# Patient Record
Sex: Female | Born: 1940 | Race: White | Hispanic: No | Smoking: Former smoker
Health system: Southern US, Community
[De-identification: ages and names within clinical notes are randomized; demographics above are authoritative.]

## PROBLEM LIST (undated history)

## (undated) DIAGNOSIS — C50919 Malignant neoplasm of unspecified site of unspecified female breast: Secondary | ICD-10-CM

## (undated) DIAGNOSIS — F329 Major depressive disorder, single episode, unspecified: Secondary | ICD-10-CM

## (undated) DIAGNOSIS — K219 Gastro-esophageal reflux disease without esophagitis: Secondary | ICD-10-CM

## (undated) DIAGNOSIS — G629 Polyneuropathy, unspecified: Secondary | ICD-10-CM

## (undated) DIAGNOSIS — C801 Malignant (primary) neoplasm, unspecified: Secondary | ICD-10-CM

## (undated) DIAGNOSIS — F32A Depression, unspecified: Secondary | ICD-10-CM

## (undated) DIAGNOSIS — E7800 Pure hypercholesterolemia, unspecified: Secondary | ICD-10-CM

## (undated) DIAGNOSIS — Z95828 Presence of other vascular implants and grafts: Secondary | ICD-10-CM

## (undated) DIAGNOSIS — M810 Age-related osteoporosis without current pathological fracture: Secondary | ICD-10-CM

## (undated) DIAGNOSIS — M1990 Unspecified osteoarthritis, unspecified site: Secondary | ICD-10-CM

## (undated) HISTORY — PX: ABDOMINAL HYSTERECTOMY: SHX81

## (undated) HISTORY — DX: Unspecified osteoarthritis, unspecified site: M19.90

## (undated) HISTORY — PX: JOINT REPLACEMENT: SHX530

## (undated) HISTORY — DX: Malignant neoplasm of unspecified site of unspecified female breast: C50.919

## (undated) HISTORY — PX: TONSILLECTOMY: SUR1361

## (undated) HISTORY — PX: OTHER SURGICAL HISTORY: SHX169

## (undated) HISTORY — DX: Age-related osteoporosis without current pathological fracture: M81.0

## (undated) HISTORY — DX: Presence of other vascular implants and grafts: Z95.828

## (undated) HISTORY — PX: MASTECTOMY: SHX3

## (undated) HISTORY — DX: Gastro-esophageal reflux disease without esophagitis: K21.9

---

## 2010-10-08 ENCOUNTER — Ambulatory Visit (HOSPITAL_COMMUNITY): Admission: RE | Admit: 2010-10-08 | Discharge: 2010-10-08 | Payer: Self-pay | Admitting: Family Medicine

## 2010-10-28 ENCOUNTER — Ambulatory Visit (HOSPITAL_COMMUNITY)
Admission: RE | Admit: 2010-10-28 | Discharge: 2010-10-28 | Payer: Self-pay | Source: Home / Self Care | Attending: Family Medicine | Admitting: Family Medicine

## 2010-11-04 ENCOUNTER — Encounter
Admission: RE | Admit: 2010-11-04 | Discharge: 2010-11-04 | Payer: Self-pay | Source: Home / Self Care | Attending: Family Medicine | Admitting: Family Medicine

## 2010-11-06 ENCOUNTER — Encounter
Admission: RE | Admit: 2010-11-06 | Discharge: 2010-11-06 | Payer: Self-pay | Source: Home / Self Care | Attending: Family Medicine | Admitting: Family Medicine

## 2010-11-27 ENCOUNTER — Inpatient Hospital Stay (HOSPITAL_COMMUNITY)
Admission: RE | Admit: 2010-11-27 | Discharge: 2010-11-30 | Payer: Self-pay | Source: Home / Self Care | Attending: General Surgery | Admitting: General Surgery

## 2010-11-27 ENCOUNTER — Encounter (INDEPENDENT_AMBULATORY_CARE_PROVIDER_SITE_OTHER): Payer: Self-pay | Admitting: General Surgery

## 2010-12-13 ENCOUNTER — Encounter: Payer: Self-pay | Admitting: Family Medicine

## 2010-12-21 ENCOUNTER — Other Ambulatory Visit (HOSPITAL_COMMUNITY): Payer: Self-pay | Admitting: Oncology

## 2010-12-21 ENCOUNTER — Encounter (HOSPITAL_COMMUNITY)
Admission: RE | Admit: 2010-12-21 | Discharge: 2010-12-22 | Payer: Self-pay | Source: Home / Self Care | Attending: Oncology | Admitting: Oncology

## 2010-12-21 ENCOUNTER — Ambulatory Visit (HOSPITAL_COMMUNITY)
Admission: RE | Admit: 2010-12-21 | Discharge: 2010-12-22 | Payer: Self-pay | Source: Home / Self Care | Attending: Oncology | Admitting: Oncology

## 2010-12-21 DIAGNOSIS — C50919 Malignant neoplasm of unspecified site of unspecified female breast: Secondary | ICD-10-CM

## 2010-12-21 LAB — DIFFERENTIAL
Basophils Absolute: 0.1 10*3/uL (ref 0.0–0.1)
Basophils Relative: 1 % (ref 0–1)
Eosinophils Absolute: 0.3 10*3/uL (ref 0.0–0.7)
Eosinophils Relative: 3 % (ref 0–5)
Lymphocytes Relative: 31 % (ref 12–46)
Lymphs Abs: 2.2 10*3/uL (ref 0.7–4.0)
Monocytes Absolute: 0.8 10*3/uL (ref 0.1–1.0)
Monocytes Relative: 11 % (ref 3–12)
Neutro Abs: 4 10*3/uL (ref 1.7–7.7)
Neutrophils Relative %: 55 % (ref 43–77)

## 2010-12-21 LAB — COMPREHENSIVE METABOLIC PANEL
ALT: 12 U/L (ref 0–35)
AST: 18 U/L (ref 0–37)
Albumin: 4.2 g/dL (ref 3.5–5.2)
Alkaline Phosphatase: 40 U/L (ref 39–117)
BUN: 16 mg/dL (ref 6–23)
CO2: 28 mEq/L (ref 19–32)
Calcium: 10 mg/dL (ref 8.4–10.5)
Chloride: 106 mEq/L (ref 96–112)
Creatinine, Ser: 0.56 mg/dL (ref 0.4–1.2)
GFR calc Af Amer: >60 mL/min (ref >60)
GFR calc non Af Amer: >60 mL/min (ref >60)
Glucose, Bld: 91 mg/dL (ref 70–99)
Potassium: 3.7 mEq/L (ref 3.5–5.1)
Sodium: 143 mEq/L (ref 135–145)
Total Bilirubin: 0.7 mg/dL (ref 0.3–1.2)
Total Protein: 7.1 g/dL (ref 6.0–8.3)

## 2010-12-21 LAB — CBC
HCT: 36.1 % (ref 36.0–46.0)
Hemoglobin: 12.1 g/dL (ref 12.0–15.0)
MCH: 32.3 pg (ref 26.0–34.0)
MCHC: 33.5 g/dL (ref 30.0–36.0)
MCV: 96.3 fL (ref 78.0–100.0)
Platelets: 386 10*3/uL (ref 150–400)
RBC: 3.75 MIL/uL — ABNORMAL LOW (ref 3.87–5.11)
RDW: 12.5 % (ref 11.5–15.5)
WBC: 7.3 10*3/uL (ref 4.0–10.5)

## 2010-12-22 LAB — CANCER ANTIGEN 27.29: CA 27.29: 8 U/mL (ref 0–39)

## 2010-12-23 ENCOUNTER — Other Ambulatory Visit (HOSPITAL_COMMUNITY): Payer: Self-pay | Admitting: Oncology

## 2010-12-23 ENCOUNTER — Ambulatory Visit (HOSPITAL_COMMUNITY)
Admission: RE | Admit: 2010-12-23 | Discharge: 2010-12-23 | Disposition: A | Discharge status: Home / Self Care | Payer: Medicare Other | Source: Ambulatory Visit | Attending: Oncology | Admitting: Oncology

## 2010-12-23 DIAGNOSIS — K429 Umbilical hernia without obstruction or gangrene: Secondary | ICD-10-CM | POA: Insufficient documentation

## 2010-12-23 DIAGNOSIS — M899 Disorder of bone, unspecified: Secondary | ICD-10-CM | POA: Insufficient documentation

## 2010-12-23 DIAGNOSIS — J984 Other disorders of lung: Secondary | ICD-10-CM | POA: Insufficient documentation

## 2010-12-23 DIAGNOSIS — M949 Disorder of cartilage, unspecified: Secondary | ICD-10-CM | POA: Insufficient documentation

## 2010-12-23 DIAGNOSIS — C50919 Malignant neoplasm of unspecified site of unspecified female breast: Secondary | ICD-10-CM | POA: Insufficient documentation

## 2010-12-23 MED ORDER — IOHEXOL 300 MG/ML  SOLN
80.0000 mL | Freq: Once | INTRAMUSCULAR | Status: AC | PRN
  Administered 2010-12-23: 100 mL via INTRAVENOUS

## 2010-12-24 ENCOUNTER — Encounter (HOSPITAL_COMMUNITY): Payer: Medicare Other

## 2010-12-24 LAB — SURGICAL PCR SCREEN
MRSA, PCR: NEGATIVE
Staphylococcus aureus: NEGATIVE

## 2010-12-25 ENCOUNTER — Ambulatory Visit (HOSPITAL_COMMUNITY): Payer: Self-pay

## 2010-12-25 ENCOUNTER — Ambulatory Visit (HOSPITAL_COMMUNITY)
Admission: RE | Admit: 2010-12-25 | Discharge: 2010-12-25 | Disposition: A | Discharge status: Home / Self Care | Payer: Medicare Other | Source: Ambulatory Visit | Attending: Oncology | Admitting: Oncology

## 2010-12-25 ENCOUNTER — Encounter (HOSPITAL_COMMUNITY): Payer: Self-pay

## 2010-12-25 DIAGNOSIS — C50919 Malignant neoplasm of unspecified site of unspecified female breast: Secondary | ICD-10-CM | POA: Insufficient documentation

## 2010-12-25 DIAGNOSIS — Z96649 Presence of unspecified artificial hip joint: Secondary | ICD-10-CM | POA: Insufficient documentation

## 2010-12-25 HISTORY — DX: Malignant (primary) neoplasm, unspecified: C80.1

## 2010-12-25 MED ORDER — TECHNETIUM TC 99M MEDRONATE IV KIT
25.0000 | PACK | Freq: Once | INTRAVENOUS | Status: AC | PRN
  Administered 2010-12-25: 24 via INTRAVENOUS

## 2010-12-26 NOTE — H&P (Addendum)
  Roberta Gonzalez, Roberta Gonzalez               ACCOUNT NO.:  [id number]  MEDICAL RECORD NO.:  [id number]           PATIENT TYPE:  O  LOCATION:  DOIB                          FACILITY:  [facility]  PHYSICIAN:  Dalia Heading, M.D.  DATE OF BIRTH:  1941-02-10  DATE OF ADMISSION:  12/24/2010 DATE OF DISCHARGE:  LH                             HISTORY & PHYSICAL   CHIEF COMPLAINT:  Left breast carcinoma, need for central venous access.  HISTORY OF PRESENT ILLNESS:  The patient is a 70 year old white female status post a left modified radical mastectomy, who now presents for a Port-A-Cath due to the need for central venous access for chemotherapy.  Past medical history is as noted above, high cholesterol levels, acid reflux disease, osteoporosis, right breast cancer in the remote past  PAST SURGICAL HISTORY:  Left modified radical mastectomy, hip replacements bilaterally, right modified radical mastectomy, left modified radical mastectomy as previously noted.  CURRENT MEDICATIONS:  Fosamax, an acid reflux pill, cholesterol pill.  ALLERGIES:  No known drug allergies.  REVIEW OF SYSTEMS:  The patient does drink an ounce of alcohol daily. She denies any tobacco use.  She denies any other cardiopulmonary difficulties or bleeding disorders.  FAMILY MEDICAL HISTORY:  The patient denies any immediate family history of breast cancer.  On physical examination, the patient is well-developed and well- nourished white female in no acute distress.  HEENT examination is unremarkable.  Neck is supple without lymphadenopathy.  Lungs clear to auscultation with equal breath sounds bilaterally.  Heart examination reveals regular rate and rhythm without S3, S4, or murmurs.  IMPRESSION:  Left breast carcinoma.  PLAN:  The patient is scheduled for a Port-A-Cath insertion on December 30, 2010.  The risks and benefits of the procedure including bleeding, infection and pneumothorax were fully explained to the patient,  gave informed consent.     Dalia Heading, M.D.     MAJ/MEDQ  D:  12/24/2010  T:  12/25/2010  Job:  [id number]  cc:   [facility]  [name], MD Fax: [phone]  Ladona Horns. Mariel Sleet, MD Fax: [phone]  Electronically Signed by Franky Macho M.D. on 12/26/2010 01:10:07 PM

## 2010-12-30 ENCOUNTER — Ambulatory Visit (HOSPITAL_COMMUNITY): Payer: Medicare Other

## 2010-12-30 ENCOUNTER — Ambulatory Visit (HOSPITAL_COMMUNITY)
Admission: RE | Admit: 2010-12-30 | Discharge: 2010-12-30 | Disposition: A | Discharge status: Home / Self Care | Payer: Medicare Other | Source: Ambulatory Visit | Attending: General Surgery | Admitting: General Surgery

## 2010-12-30 DIAGNOSIS — C50919 Malignant neoplasm of unspecified site of unspecified female breast: Secondary | ICD-10-CM

## 2010-12-31 ENCOUNTER — Inpatient Hospital Stay (HOSPITAL_COMMUNITY): Payer: Medicare Other

## 2010-12-31 DIAGNOSIS — C50919 Malignant neoplasm of unspecified site of unspecified female breast: Secondary | ICD-10-CM

## 2011-01-01 ENCOUNTER — Other Ambulatory Visit (HOSPITAL_COMMUNITY): Payer: Self-pay | Admitting: Oncology

## 2011-01-01 ENCOUNTER — Encounter (HOSPITAL_COMMUNITY): Payer: Medicare Other | Attending: Hematology and Oncology

## 2011-01-01 ENCOUNTER — Inpatient Hospital Stay (HOSPITAL_COMMUNITY): Payer: Medicare Other

## 2011-01-01 DIAGNOSIS — C50919 Malignant neoplasm of unspecified site of unspecified female breast: Secondary | ICD-10-CM

## 2011-01-01 DIAGNOSIS — Z5112 Encounter for antineoplastic immunotherapy: Secondary | ICD-10-CM

## 2011-01-01 DIAGNOSIS — G609 Hereditary and idiopathic neuropathy, unspecified: Secondary | ICD-10-CM | POA: Insufficient documentation

## 2011-01-01 DIAGNOSIS — R5381 Other malaise: Secondary | ICD-10-CM | POA: Insufficient documentation

## 2011-01-01 DIAGNOSIS — Z5111 Encounter for antineoplastic chemotherapy: Secondary | ICD-10-CM

## 2011-01-01 DIAGNOSIS — Z79899 Other long term (current) drug therapy: Secondary | ICD-10-CM | POA: Insufficient documentation

## 2011-01-01 LAB — DIFFERENTIAL
Basophils Relative: 0 % (ref 0–1)
Eosinophils Absolute: 0 10*3/uL (ref 0.0–0.7)
Eosinophils Relative: 0 % (ref 0–5)
Lymphs Abs: 1 10*3/uL (ref 0.7–4.0)
Monocytes Absolute: 0.5 10*3/uL (ref 0.1–1.0)
Monocytes Relative: 7 % (ref 3–12)
Neutrophils Relative %: 79 % — ABNORMAL HIGH (ref 43–77)

## 2011-01-01 LAB — CBC
HCT: 33 % — ABNORMAL LOW (ref 36.0–46.0)
Hemoglobin: 11.2 g/dL — ABNORMAL LOW (ref 12.0–15.0)
MCH: 32 pg (ref 26.0–34.0)
MCHC: 33.9 g/dL (ref 30.0–36.0)
MCV: 94.3 fL (ref 78.0–100.0)

## 2011-01-01 LAB — COMPREHENSIVE METABOLIC PANEL
BUN: 25 mg/dL — ABNORMAL HIGH (ref 6–23)
CO2: 22 mEq/L (ref 19–32)
Calcium: 9.9 mg/dL (ref 8.4–10.5)
Creatinine, Ser: 0.57 mg/dL (ref 0.4–1.2)
GFR calc non Af Amer: >60 mL/min (ref >60)
Glucose, Bld: 194 mg/dL — ABNORMAL HIGH (ref 70–99)
Total Protein: 7 g/dL (ref 6.0–8.3)

## 2011-01-03 NOTE — Op Note (Signed)
  NAMEFAYRENE, Roberta Gonzalez               ACCOUNT NO.:  [id number]  MEDICAL RECORD NO.:  [id number]           PATIENT TYPE:  O  LOCATION:  DAYP                          FACILITY:  [facility]  PHYSICIAN:  Dalia Heading, M.D.  DATE OF BIRTH:  Sep 29, 1941  DATE OF PROCEDURE: DATE OF DISCHARGE:  12/30/2010                              OPERATIVE REPORT   PREOPERATIVE DIAGNOSIS:  Left breast carcinoma, need for central venous access for chemotherapy.  POSTOPERATIVE DIAGNOSIS:  Left breast carcinoma, need for central venous access for chemotherapy.  PROCEDURE:  Port-A-Cath insertion.  SURGEON:  Dalia Heading, MD  ANESTHESIA:  Attended or monitored anesthesia care.  INDICATIONS:  The patient is a 70 year old white female with status post left modified radical mastectomy, who now presents for Port-A-Cath insertion due to the need for central venous access for chemotherapy. The risks and benefits of procedure including bleeding and pneumothorax were fully explained to the patient, gave informed consent.  PROCEDURE NOTE:  The patient was placed in the Trendelenburg position after the right upper chest was prepped and draped using the usual sterile technique with DuraPrep.  Surgical site confirmation was performed.  1% Xylocaine was used for local anesthesia.  An incision was made below the right clavicle.  Subcutaneous pocket was then formed.  A needle was advanced into the right subclavian vein using the Seldinger technique without difficulty.  The guidewire was then advanced into the right atrium under digital fluoroscopy.  An introducer and peel-away sheath were placed over the guidewire.  The catheter was then inserted through the peel-away sheath and peel-away sheath was removed.  The catheter was then attached to a power port and the power port was placed in subcutaneous pocket.  Adequate positioning was confirmed by fluoroscopy.  The port was flushed with 3000 units of heparin.  A  subcutaneous layer was reapproximated using 3-0 Vicryl interrupted suture.  The skin was closed using 4-0 Vicryl subcuticular suture. Dermabond was then applied.  All tape and needle counts correct at the end of procedure.  The patient was transferred to PACU in stable condition.  Chest x-ray will be performed at that time.  COMPLICATIONS:  None.  SPECIMEN:  None.  BLOOD LOSS:  Minimal.     Dalia Heading, M.D.     MAJ/MEDQ  D:  12/30/2010  T:  12/31/2010  Job:  [id number]  cc:   [name], MD Fax: [phone]  Ladona Horns. Mariel Sleet, MD Fax: [phone]  Electronically Signed by Franky Macho M.D. on 01/01/2011 01:25:00 PM

## 2011-01-08 ENCOUNTER — Encounter (HOSPITAL_COMMUNITY): Payer: Medicare Other | Attending: Hematology and Oncology

## 2011-01-08 ENCOUNTER — Other Ambulatory Visit (HOSPITAL_COMMUNITY): Payer: Medicare Other

## 2011-01-08 DIAGNOSIS — R5381 Other malaise: Secondary | ICD-10-CM | POA: Insufficient documentation

## 2011-01-08 DIAGNOSIS — Z79899 Other long term (current) drug therapy: Secondary | ICD-10-CM | POA: Insufficient documentation

## 2011-01-08 DIAGNOSIS — C50919 Malignant neoplasm of unspecified site of unspecified female breast: Secondary | ICD-10-CM

## 2011-01-08 DIAGNOSIS — G609 Hereditary and idiopathic neuropathy, unspecified: Secondary | ICD-10-CM | POA: Insufficient documentation

## 2011-01-15 ENCOUNTER — Other Ambulatory Visit (HOSPITAL_COMMUNITY): Payer: Medicare Other

## 2011-01-15 ENCOUNTER — Encounter (HOSPITAL_COMMUNITY): Payer: Medicare Other

## 2011-01-15 DIAGNOSIS — C50919 Malignant neoplasm of unspecified site of unspecified female breast: Secondary | ICD-10-CM

## 2011-01-20 ENCOUNTER — Ambulatory Visit (HOSPITAL_COMMUNITY): Payer: Medicare Other | Admitting: Oncology

## 2011-01-20 DIAGNOSIS — C50919 Malignant neoplasm of unspecified site of unspecified female breast: Secondary | ICD-10-CM

## 2011-01-22 ENCOUNTER — Inpatient Hospital Stay (HOSPITAL_COMMUNITY): Payer: Medicare Other

## 2011-01-22 ENCOUNTER — Ambulatory Visit (HOSPITAL_COMMUNITY): Payer: Medicare Other

## 2011-01-22 DIAGNOSIS — Z5112 Encounter for antineoplastic immunotherapy: Secondary | ICD-10-CM

## 2011-01-22 DIAGNOSIS — C50919 Malignant neoplasm of unspecified site of unspecified female breast: Secondary | ICD-10-CM

## 2011-01-22 DIAGNOSIS — Z5111 Encounter for antineoplastic chemotherapy: Secondary | ICD-10-CM

## 2011-02-01 ENCOUNTER — Inpatient Hospital Stay (HOSPITAL_COMMUNITY): Payer: Medicare Other

## 2011-02-01 LAB — COMPREHENSIVE METABOLIC PANEL
ALT: 22 U/L (ref 0–35)
AST: 23 U/L (ref 0–37)
Albumin: 4.2 g/dL (ref 3.5–5.2)
Alkaline Phosphatase: 51 U/L (ref 39–117)
BUN: 18 mg/dL (ref 6–23)
CO2: 28 mEq/L (ref 19–32)
Calcium: 10 mg/dL (ref 8.4–10.5)
Chloride: 103 mEq/L (ref 96–112)
Creatinine, Ser: 0.66 mg/dL (ref 0.4–1.2)
GFR calc Af Amer: >60 mL/min (ref >60)
GFR calc non Af Amer: >60 mL/min (ref >60)
Glucose, Bld: 95 mg/dL (ref 70–99)
Potassium: 4.3 mEq/L (ref 3.5–5.1)
Sodium: 144 mEq/L (ref 135–145)
Total Bilirubin: 0.3 mg/dL (ref 0.3–1.2)
Total Protein: 7 g/dL (ref 6.0–8.3)

## 2011-02-01 LAB — SURGICAL PCR SCREEN
MRSA, PCR: NEGATIVE
Staphylococcus aureus: NEGATIVE

## 2011-02-01 LAB — CBC
HCT: 36.1 % (ref 36.0–46.0)
Hemoglobin: 12.5 g/dL (ref 12.0–15.0)
MCH: 32.9 pg (ref 26.0–34.0)
MCHC: 34.6 g/dL (ref 30.0–36.0)
MCV: 95 fL (ref 78.0–100.0)
Platelets: 373 10*3/uL (ref 150–400)
RBC: 3.8 MIL/uL — ABNORMAL LOW (ref 3.87–5.11)
RDW: 11.9 % (ref 11.5–15.5)
WBC: 8.1 10*3/uL (ref 4.0–10.5)

## 2011-02-01 LAB — CROSSMATCH
ABO/RH(D): A NEG
Antibody Screen: NEGATIVE
Unit division: 0
Unit division: 0
Unit division: 0

## 2011-02-01 LAB — ABO/RH: ABO/RH(D): A NEG

## 2011-02-11 ENCOUNTER — Ambulatory Visit (HOSPITAL_COMMUNITY): Payer: Medicare Other | Admitting: Oncology

## 2011-02-11 ENCOUNTER — Encounter (HOSPITAL_COMMUNITY): Payer: Medicare Other | Attending: Oncology

## 2011-02-11 ENCOUNTER — Other Ambulatory Visit (HOSPITAL_COMMUNITY): Payer: Medicare Other

## 2011-02-11 ENCOUNTER — Other Ambulatory Visit (HOSPITAL_COMMUNITY): Payer: Self-pay | Admitting: Oncology

## 2011-02-11 DIAGNOSIS — C773 Secondary and unspecified malignant neoplasm of axilla and upper limb lymph nodes: Secondary | ICD-10-CM | POA: Insufficient documentation

## 2011-02-11 DIAGNOSIS — C50919 Malignant neoplasm of unspecified site of unspecified female breast: Secondary | ICD-10-CM

## 2011-02-11 DIAGNOSIS — R911 Solitary pulmonary nodule: Secondary | ICD-10-CM

## 2011-02-11 DIAGNOSIS — Z79899 Other long term (current) drug therapy: Secondary | ICD-10-CM | POA: Insufficient documentation

## 2011-02-12 ENCOUNTER — Ambulatory Visit (HOSPITAL_COMMUNITY): Payer: Medicare Other

## 2011-02-12 DIAGNOSIS — C50919 Malignant neoplasm of unspecified site of unspecified female breast: Secondary | ICD-10-CM

## 2011-02-12 DIAGNOSIS — Z5111 Encounter for antineoplastic chemotherapy: Secondary | ICD-10-CM

## 2011-02-12 DIAGNOSIS — Z5112 Encounter for antineoplastic immunotherapy: Secondary | ICD-10-CM

## 2011-02-16 ENCOUNTER — Ambulatory Visit (HOSPITAL_COMMUNITY)
Admission: RE | Admit: 2011-02-16 | Discharge: 2011-02-16 | Disposition: A | Discharge status: Home / Self Care | Payer: Medicare Other | Source: Ambulatory Visit | Attending: Oncology | Admitting: Oncology

## 2011-02-16 ENCOUNTER — Other Ambulatory Visit (HOSPITAL_COMMUNITY): Payer: Self-pay | Admitting: Oncology

## 2011-02-16 DIAGNOSIS — Z9221 Personal history of antineoplastic chemotherapy: Secondary | ICD-10-CM | POA: Insufficient documentation

## 2011-02-16 DIAGNOSIS — C50919 Malignant neoplasm of unspecified site of unspecified female breast: Secondary | ICD-10-CM | POA: Insufficient documentation

## 2011-02-16 DIAGNOSIS — Z09 Encounter for follow-up examination after completed treatment for conditions other than malignant neoplasm: Secondary | ICD-10-CM

## 2011-03-04 ENCOUNTER — Other Ambulatory Visit (HOSPITAL_COMMUNITY): Payer: Self-pay | Admitting: Oncology

## 2011-03-04 ENCOUNTER — Encounter (HOSPITAL_COMMUNITY): Payer: Medicare Other | Attending: Hematology and Oncology | Admitting: Oncology

## 2011-03-04 ENCOUNTER — Encounter (HOSPITAL_COMMUNITY): Payer: Medicare Other

## 2011-03-04 DIAGNOSIS — C50919 Malignant neoplasm of unspecified site of unspecified female breast: Secondary | ICD-10-CM | POA: Insufficient documentation

## 2011-03-04 DIAGNOSIS — R5381 Other malaise: Secondary | ICD-10-CM | POA: Insufficient documentation

## 2011-03-04 DIAGNOSIS — G609 Hereditary and idiopathic neuropathy, unspecified: Secondary | ICD-10-CM | POA: Insufficient documentation

## 2011-03-04 DIAGNOSIS — Z79899 Other long term (current) drug therapy: Secondary | ICD-10-CM | POA: Insufficient documentation

## 2011-03-05 ENCOUNTER — Emergency Department (HOSPITAL_COMMUNITY)
Admission: EM | Admit: 2011-03-05 | Discharge: 2011-03-06 | Disposition: A | Discharge status: Home / Self Care | Payer: Medicare Other | Attending: Emergency Medicine | Admitting: Emergency Medicine

## 2011-03-05 ENCOUNTER — Encounter (HOSPITAL_COMMUNITY): Payer: Medicare Other

## 2011-03-05 DIAGNOSIS — Z9889 Other specified postprocedural states: Secondary | ICD-10-CM | POA: Insufficient documentation

## 2011-03-05 DIAGNOSIS — Z79899 Other long term (current) drug therapy: Secondary | ICD-10-CM | POA: Insufficient documentation

## 2011-03-05 DIAGNOSIS — C50919 Malignant neoplasm of unspecified site of unspecified female breast: Secondary | ICD-10-CM

## 2011-03-05 DIAGNOSIS — Z5112 Encounter for antineoplastic immunotherapy: Secondary | ICD-10-CM

## 2011-03-05 DIAGNOSIS — R1013 Epigastric pain: Secondary | ICD-10-CM | POA: Insufficient documentation

## 2011-03-05 DIAGNOSIS — L658 Other specified nonscarring hair loss: Secondary | ICD-10-CM | POA: Insufficient documentation

## 2011-03-05 DIAGNOSIS — Z5111 Encounter for antineoplastic chemotherapy: Secondary | ICD-10-CM

## 2011-03-05 DIAGNOSIS — K121 Other forms of stomatitis: Secondary | ICD-10-CM | POA: Insufficient documentation

## 2011-03-05 DIAGNOSIS — K219 Gastro-esophageal reflux disease without esophagitis: Secondary | ICD-10-CM | POA: Insufficient documentation

## 2011-03-05 LAB — ANA: Anti Nuclear Antibody(ANA): NEGATIVE

## 2011-03-06 ENCOUNTER — Emergency Department (HOSPITAL_COMMUNITY): Payer: Medicare Other

## 2011-03-06 LAB — CBC
HCT: 32 % — ABNORMAL LOW (ref 36.0–46.0)
MCV: 94.7 fL (ref 78.0–100.0)
Platelets: 211 10*3/uL (ref 150–400)
RBC: 3.38 MIL/uL — ABNORMAL LOW (ref 3.87–5.11)
RDW: 13.7 % (ref 11.5–15.5)
WBC: 7.5 10*3/uL (ref 4.0–10.5)

## 2011-03-06 LAB — BASIC METABOLIC PANEL
BUN: 14 mg/dL (ref 6–23)
Calcium: 8.3 mg/dL — ABNORMAL LOW (ref 8.4–10.5)
GFR calc non Af Amer: >60 mL/min (ref >60)
Glucose, Bld: 133 mg/dL — ABNORMAL HIGH (ref 70–99)
Potassium: 3.2 mEq/L — ABNORMAL LOW (ref 3.5–5.1)

## 2011-03-06 LAB — POCT CARDIAC MARKERS
CKMB, poc: 2.5 ng/mL (ref 1.0–8.0)
Myoglobin, poc: 481 ng/mL (ref 12–200)
Troponin i, poc: <0.05 ng/mL (ref 0.00–0.09)

## 2011-03-06 LAB — DIFFERENTIAL
Basophils Absolute: 0 10*3/uL (ref 0.0–0.1)
Eosinophils Absolute: 0 10*3/uL (ref 0.0–0.7)
Eosinophils Relative: 0 % (ref 0–5)
Lymphocytes Relative: 10 % — ABNORMAL LOW (ref 12–46)
Lymphs Abs: 0.8 10*3/uL (ref 0.7–4.0)
Neutrophils Relative %: 79 % — ABNORMAL HIGH (ref 43–77)

## 2011-03-15 ENCOUNTER — Other Ambulatory Visit (HOSPITAL_COMMUNITY): Payer: Medicare Other

## 2011-03-22 ENCOUNTER — Ambulatory Visit: Payer: Medicare Other | Admitting: Cardiology

## 2011-03-25 ENCOUNTER — Other Ambulatory Visit (HOSPITAL_COMMUNITY): Payer: Self-pay | Admitting: Oncology

## 2011-03-25 ENCOUNTER — Encounter (HOSPITAL_COMMUNITY): Payer: Medicare Other | Attending: Hematology and Oncology | Admitting: Oncology

## 2011-03-25 DIAGNOSIS — C50919 Malignant neoplasm of unspecified site of unspecified female breast: Secondary | ICD-10-CM | POA: Insufficient documentation

## 2011-03-25 DIAGNOSIS — G609 Hereditary and idiopathic neuropathy, unspecified: Secondary | ICD-10-CM | POA: Insufficient documentation

## 2011-03-25 DIAGNOSIS — Z79899 Other long term (current) drug therapy: Secondary | ICD-10-CM | POA: Insufficient documentation

## 2011-03-25 DIAGNOSIS — R5383 Other fatigue: Secondary | ICD-10-CM | POA: Insufficient documentation

## 2011-03-25 DIAGNOSIS — R5381 Other malaise: Secondary | ICD-10-CM | POA: Insufficient documentation

## 2011-03-25 LAB — DIFFERENTIAL
Basophils Absolute: 0 10*3/uL (ref 0.0–0.1)
Basophils Relative: 0 % (ref 0–1)
Eosinophils Absolute: 0 10*3/uL (ref 0.0–0.7)
Eosinophils Relative: 0 % (ref 0–5)
Neutrophils Relative %: 85 % — ABNORMAL HIGH (ref 43–77)

## 2011-03-25 LAB — COMPREHENSIVE METABOLIC PANEL
ALT: 12 U/L (ref 0–35)
AST: 14 U/L (ref 0–37)
Albumin: 3.7 g/dL (ref 3.5–5.2)
Alkaline Phosphatase: 51 U/L (ref 39–117)
BUN: 11 mg/dL (ref 6–23)
Chloride: 104 mEq/L (ref 96–112)
Potassium: 3.6 mEq/L (ref 3.5–5.1)
Sodium: 143 mEq/L (ref 135–145)
Total Protein: 6.3 g/dL (ref 6.0–8.3)

## 2011-03-25 LAB — CBC
Platelets: 227 10*3/uL (ref 150–400)
RBC: 3.34 MIL/uL — ABNORMAL LOW (ref 3.87–5.11)
RDW: 14.5 % (ref 11.5–15.5)
WBC: 7.1 10*3/uL (ref 4.0–10.5)

## 2011-03-26 ENCOUNTER — Encounter (HOSPITAL_COMMUNITY): Payer: Medicare Other | Attending: Oncology

## 2011-03-26 DIAGNOSIS — Z5111 Encounter for antineoplastic chemotherapy: Secondary | ICD-10-CM

## 2011-03-26 DIAGNOSIS — C50919 Malignant neoplasm of unspecified site of unspecified female breast: Secondary | ICD-10-CM

## 2011-03-29 ENCOUNTER — Other Ambulatory Visit (HOSPITAL_COMMUNITY): Payer: Medicare Other

## 2011-04-13 ENCOUNTER — Other Ambulatory Visit (HOSPITAL_COMMUNITY): Payer: Self-pay | Admitting: Oncology

## 2011-04-13 ENCOUNTER — Encounter (HOSPITAL_COMMUNITY): Payer: Medicare Other | Admitting: Oncology

## 2011-04-13 DIAGNOSIS — C50919 Malignant neoplasm of unspecified site of unspecified female breast: Secondary | ICD-10-CM

## 2011-04-13 DIAGNOSIS — M549 Dorsalgia, unspecified: Secondary | ICD-10-CM

## 2011-04-13 LAB — CBC
HCT: 30.3 % — ABNORMAL LOW (ref 36.0–46.0)
Hemoglobin: 9.8 g/dL — ABNORMAL LOW (ref 12.0–15.0)
MCH: 31.3 pg (ref 26.0–34.0)
MCHC: 32.3 g/dL (ref 30.0–36.0)
MCV: 96.8 fL (ref 78.0–100.0)
RDW: 15.3 % (ref 11.5–15.5)

## 2011-04-13 LAB — COMPREHENSIVE METABOLIC PANEL
AST: 15 U/L (ref 0–37)
Albumin: 3.5 g/dL (ref 3.5–5.2)
Alkaline Phosphatase: 56 U/L (ref 39–117)
Chloride: 106 mEq/L (ref 96–112)
Creatinine, Ser: <0.47 mg/dL (ref 0.4–1.2)
Potassium: 3.9 mEq/L (ref 3.5–5.1)
Total Bilirubin: 0.2 mg/dL — ABNORMAL LOW (ref 0.3–1.2)
Total Protein: 6.3 g/dL (ref 6.0–8.3)

## 2011-04-13 LAB — DIFFERENTIAL
Basophils Absolute: 0 10*3/uL (ref 0.0–0.1)
Eosinophils Relative: 0 % (ref 0–5)
Lymphocytes Relative: 34 % (ref 12–46)
Monocytes Absolute: 0.7 10*3/uL (ref 0.1–1.0)
Monocytes Relative: 17 % — ABNORMAL HIGH (ref 3–12)

## 2011-04-16 ENCOUNTER — Encounter (HOSPITAL_COMMUNITY): Payer: Medicare Other

## 2011-04-16 DIAGNOSIS — Z5112 Encounter for antineoplastic immunotherapy: Secondary | ICD-10-CM

## 2011-04-16 DIAGNOSIS — Z5111 Encounter for antineoplastic chemotherapy: Secondary | ICD-10-CM

## 2011-04-16 DIAGNOSIS — C50919 Malignant neoplasm of unspecified site of unspecified female breast: Secondary | ICD-10-CM

## 2011-04-20 ENCOUNTER — Encounter (HOSPITAL_COMMUNITY): Payer: Medicare Other

## 2011-04-20 ENCOUNTER — Ambulatory Visit (HOSPITAL_COMMUNITY)
Admission: RE | Admit: 2011-04-20 | Discharge: 2011-04-20 | Disposition: A | Discharge status: Home / Self Care | Payer: Medicare Other | Source: Ambulatory Visit | Attending: Oncology | Admitting: Oncology

## 2011-04-20 ENCOUNTER — Encounter (HOSPITAL_COMMUNITY)
Admission: RE | Admit: 2011-04-20 | Discharge: 2011-04-20 | Disposition: A | Discharge status: Home / Self Care | Payer: Medicare Other | Source: Ambulatory Visit | Attending: Oncology | Admitting: Oncology

## 2011-04-20 ENCOUNTER — Other Ambulatory Visit (HOSPITAL_COMMUNITY): Payer: Self-pay | Admitting: Oncology

## 2011-04-20 DIAGNOSIS — Z452 Encounter for adjustment and management of vascular access device: Secondary | ICD-10-CM

## 2011-04-20 DIAGNOSIS — C50919 Malignant neoplasm of unspecified site of unspecified female breast: Secondary | ICD-10-CM

## 2011-04-20 DIAGNOSIS — R937 Abnormal findings on diagnostic imaging of other parts of musculoskeletal system: Secondary | ICD-10-CM | POA: Insufficient documentation

## 2011-04-20 DIAGNOSIS — M549 Dorsalgia, unspecified: Secondary | ICD-10-CM | POA: Insufficient documentation

## 2011-04-20 MED ORDER — TECHNETIUM TC 99M MEDRONATE IV KIT
25.0000 | PACK | Freq: Once | INTRAVENOUS | Status: AC | PRN
  Administered 2011-04-20: 27 via INTRAVENOUS

## 2011-04-26 ENCOUNTER — Ambulatory Visit
Admission: RE | Admit: 2011-04-26 | Discharge: 2011-04-26 | Disposition: A | Discharge status: Home / Self Care | Payer: Medicare Other | Source: Ambulatory Visit | Attending: Radiation Oncology | Admitting: Radiation Oncology

## 2011-04-26 DIAGNOSIS — Z79899 Other long term (current) drug therapy: Secondary | ICD-10-CM | POA: Insufficient documentation

## 2011-04-26 DIAGNOSIS — Z8505 Personal history of malignant neoplasm of liver: Secondary | ICD-10-CM | POA: Insufficient documentation

## 2011-04-26 DIAGNOSIS — E7800 Pure hypercholesterolemia, unspecified: Secondary | ICD-10-CM | POA: Insufficient documentation

## 2011-04-26 DIAGNOSIS — G5790 Unspecified mononeuropathy of unspecified lower limb: Secondary | ICD-10-CM | POA: Insufficient documentation

## 2011-04-26 DIAGNOSIS — Z9010 Acquired absence of unspecified breast and nipple: Secondary | ICD-10-CM | POA: Insufficient documentation

## 2011-04-26 DIAGNOSIS — Z85038 Personal history of other malignant neoplasm of large intestine: Secondary | ICD-10-CM | POA: Insufficient documentation

## 2011-04-26 DIAGNOSIS — C773 Secondary and unspecified malignant neoplasm of axilla and upper limb lymph nodes: Secondary | ICD-10-CM | POA: Insufficient documentation

## 2011-04-26 DIAGNOSIS — K219 Gastro-esophageal reflux disease without esophagitis: Secondary | ICD-10-CM | POA: Insufficient documentation

## 2011-04-26 DIAGNOSIS — Z85028 Personal history of other malignant neoplasm of stomach: Secondary | ICD-10-CM | POA: Insufficient documentation

## 2011-04-26 DIAGNOSIS — C50919 Malignant neoplasm of unspecified site of unspecified female breast: Secondary | ICD-10-CM | POA: Insufficient documentation

## 2011-05-07 ENCOUNTER — Other Ambulatory Visit (HOSPITAL_COMMUNITY): Payer: Self-pay | Admitting: Oncology

## 2011-05-07 ENCOUNTER — Encounter (HOSPITAL_COMMUNITY): Payer: Medicare Other | Attending: Hematology and Oncology

## 2011-05-07 DIAGNOSIS — C50919 Malignant neoplasm of unspecified site of unspecified female breast: Secondary | ICD-10-CM | POA: Insufficient documentation

## 2011-05-07 DIAGNOSIS — Z79899 Other long term (current) drug therapy: Secondary | ICD-10-CM | POA: Insufficient documentation

## 2011-05-07 DIAGNOSIS — Z5111 Encounter for antineoplastic chemotherapy: Secondary | ICD-10-CM

## 2011-05-07 DIAGNOSIS — R5381 Other malaise: Secondary | ICD-10-CM | POA: Insufficient documentation

## 2011-05-07 DIAGNOSIS — G609 Hereditary and idiopathic neuropathy, unspecified: Secondary | ICD-10-CM | POA: Insufficient documentation

## 2011-05-07 LAB — DIFFERENTIAL
Lymphocytes Relative: 31 % (ref 12–46)
Lymphs Abs: 1.2 10*3/uL (ref 0.7–4.0)
Monocytes Absolute: 0.6 10*3/uL (ref 0.1–1.0)
Monocytes Relative: 15 % — ABNORMAL HIGH (ref 3–12)
Neutro Abs: 2 10*3/uL (ref 1.7–7.7)
Neutrophils Relative %: 53 % (ref 43–77)

## 2011-05-07 LAB — CBC
HCT: 29.9 % — ABNORMAL LOW (ref 36.0–46.0)
Hemoglobin: 9.6 g/dL — ABNORMAL LOW (ref 12.0–15.0)
MCHC: 32.1 g/dL (ref 30.0–36.0)
RBC: 2.97 MIL/uL — ABNORMAL LOW (ref 3.87–5.11)
WBC: 3.8 10*3/uL — ABNORMAL LOW (ref 4.0–10.5)

## 2011-05-28 ENCOUNTER — Other Ambulatory Visit (HOSPITAL_COMMUNITY): Payer: Self-pay | Admitting: Oncology

## 2011-05-28 ENCOUNTER — Encounter (HOSPITAL_COMMUNITY): Payer: Medicare Other | Attending: Hematology and Oncology

## 2011-05-28 DIAGNOSIS — C50919 Malignant neoplasm of unspecified site of unspecified female breast: Secondary | ICD-10-CM

## 2011-05-28 LAB — CBC
HCT: 30.8 % — ABNORMAL LOW (ref 36.0–46.0)
Hemoglobin: 10 g/dL — ABNORMAL LOW (ref 12.0–15.0)
MCHC: 32.5 g/dL (ref 30.0–36.0)
MCV: 104.4 fL — ABNORMAL HIGH (ref 78.0–100.0)
RDW: 15.5 % (ref 11.5–15.5)

## 2011-05-28 LAB — DIFFERENTIAL
Basophils Absolute: 0 10*3/uL (ref 0.0–0.1)
Eosinophils Relative: 3 % (ref 0–5)
Lymphocytes Relative: 25 % (ref 12–46)
Lymphs Abs: 0.9 10*3/uL (ref 0.7–4.0)
Monocytes Absolute: 0.5 10*3/uL (ref 0.1–1.0)
Monocytes Relative: 12 % (ref 3–12)
Neutro Abs: 2.3 10*3/uL (ref 1.7–7.7)

## 2011-06-02 ENCOUNTER — Ambulatory Visit (HOSPITAL_COMMUNITY)
Admission: RE | Admit: 2011-06-02 | Discharge: 2011-06-02 | Disposition: A | Discharge status: Home / Self Care | Payer: Medicare Other | Source: Ambulatory Visit | Attending: Oncology | Admitting: Oncology

## 2011-06-02 DIAGNOSIS — C50919 Malignant neoplasm of unspecified site of unspecified female breast: Secondary | ICD-10-CM | POA: Insufficient documentation

## 2011-06-02 DIAGNOSIS — Z09 Encounter for follow-up examination after completed treatment for conditions other than malignant neoplasm: Secondary | ICD-10-CM | POA: Insufficient documentation

## 2011-06-02 DIAGNOSIS — Z923 Personal history of irradiation: Secondary | ICD-10-CM | POA: Insufficient documentation

## 2011-06-10 ENCOUNTER — Encounter (HOSPITAL_COMMUNITY): Payer: Self-pay | Admitting: Oncology

## 2011-06-10 ENCOUNTER — Other Ambulatory Visit (HOSPITAL_COMMUNITY): Payer: Self-pay | Admitting: Oncology

## 2011-06-10 DIAGNOSIS — K219 Gastro-esophageal reflux disease without esophagitis: Secondary | ICD-10-CM

## 2011-06-10 DIAGNOSIS — C50919 Malignant neoplasm of unspecified site of unspecified female breast: Secondary | ICD-10-CM

## 2011-06-10 DIAGNOSIS — R079 Chest pain, unspecified: Secondary | ICD-10-CM | POA: Insufficient documentation

## 2011-06-10 HISTORY — DX: Malignant neoplasm of unspecified site of unspecified female breast: C50.919

## 2011-06-10 HISTORY — DX: Gastro-esophageal reflux disease without esophagitis: K21.9

## 2011-06-11 ENCOUNTER — Other Ambulatory Visit (HOSPITAL_COMMUNITY): Payer: Self-pay | Admitting: Oncology

## 2011-06-17 ENCOUNTER — Other Ambulatory Visit (HOSPITAL_COMMUNITY): Payer: Self-pay | Admitting: Oncology

## 2011-06-17 DIAGNOSIS — C50919 Malignant neoplasm of unspecified site of unspecified female breast: Secondary | ICD-10-CM

## 2011-06-18 ENCOUNTER — Other Ambulatory Visit (HOSPITAL_COMMUNITY): Payer: Medicare Other

## 2011-06-18 ENCOUNTER — Encounter (HOSPITAL_BASED_OUTPATIENT_CLINIC_OR_DEPARTMENT_OTHER): Payer: Medicare Other | Admitting: Oncology

## 2011-06-18 ENCOUNTER — Encounter (HOSPITAL_BASED_OUTPATIENT_CLINIC_OR_DEPARTMENT_OTHER): Payer: Medicare Other

## 2011-06-18 VITALS — BP 93/49 | HR 74 | Temp 98.7°F | Wt 113.0 lb

## 2011-06-18 DIAGNOSIS — Z5111 Encounter for antineoplastic chemotherapy: Secondary | ICD-10-CM

## 2011-06-18 DIAGNOSIS — C50919 Malignant neoplasm of unspecified site of unspecified female breast: Secondary | ICD-10-CM

## 2011-06-18 DIAGNOSIS — K219 Gastro-esophageal reflux disease without esophagitis: Secondary | ICD-10-CM

## 2011-06-18 LAB — DIFFERENTIAL
Basophils Relative: 1 % (ref 0–1)
Eosinophils Absolute: 0.2 10*3/uL (ref 0.0–0.7)
Eosinophils Relative: 6 % — ABNORMAL HIGH (ref 0–5)
Lymphs Abs: 0.6 10*3/uL — ABNORMAL LOW (ref 0.7–4.0)
Monocytes Relative: 13 % — ABNORMAL HIGH (ref 3–12)
Neutrophils Relative %: 63 % (ref 43–77)

## 2011-06-18 LAB — CBC
Hemoglobin: 10.4 g/dL — ABNORMAL LOW (ref 12.0–15.0)
MCH: 34.3 pg — ABNORMAL HIGH (ref 26.0–34.0)
MCHC: 32.9 g/dL (ref 30.0–36.0)
MCV: 104.3 fL — ABNORMAL HIGH (ref 78.0–100.0)
Platelets: 218 10*3/uL (ref 150–400)
RBC: 3.03 MIL/uL — ABNORMAL LOW (ref 3.87–5.11)

## 2011-06-18 MED ORDER — SODIUM CHLORIDE 0.9 % IV SOLN
Freq: Once | INTRAVENOUS | Status: AC
  Administered 2011-06-18: 11:00:00 via INTRAVENOUS

## 2011-06-18 MED ORDER — SODIUM CHLORIDE 0.9 % IJ SOLN
10.0000 mL | INTRAMUSCULAR | Status: DC | PRN
  Administered 2011-06-18: 10 mL

## 2011-06-18 MED ORDER — HEPARIN SOD (PORK) LOCK FLUSH 100 UNIT/ML IV SOLN: 250.0000 [IU] | Freq: Once | INTRAVENOUS | Status: DC | PRN

## 2011-06-18 MED ORDER — SODIUM CHLORIDE 0.9 % IJ SOLN
INTRAMUSCULAR | Status: AC
  Filled 2011-06-18: qty 10

## 2011-06-18 MED ORDER — TRASTUZUMAB CHEMO INJECTION 440 MG
6.0000 mg/kg | Freq: Once | INTRAVENOUS | Status: AC
  Administered 2011-06-18: 315 mg via INTRAVENOUS
  Filled 2011-06-18: qty 15

## 2011-06-18 MED ORDER — HEPARIN SOD (PORK) LOCK FLUSH 100 UNIT/ML IV SOLN
INTRAVENOUS | Status: AC
  Filled 2011-06-18: qty 5

## 2011-06-18 MED ORDER — HEPARIN SOD (PORK) LOCK FLUSH 100 UNIT/ML IV SOLN
500.0000 [IU] | Freq: Once | INTRAVENOUS | Status: AC | PRN
  Administered 2011-06-18: 500 [IU]

## 2011-06-18 NOTE — Progress Notes (Signed)
Roberta Stalling, MD [address] [place] [address]  1. Invasive ductal carcinoma of breast   2. GERD (gastroesophageal reflux disease)     CURRENT THERAPY: S/P6 cycles of Taxotere and Carboplatin.  Now on Herceptin which she will be on for one year. Patient reports that she has approximately 2 more weeks worth of radiation therapy.  INTERVAL HISTORY: Roberta Gonzalez 70 y.o. female returns for followup of breast cancer. The patient reports that the chest pain she experienced in the past, has resolved. As a result I wonder if this was related to chemotherapy. The patient admits to a "rash" located in the field of radiation therapy. She does report that it appears as though she'll be losing her right foot big toenail. According to the patient her radiation oncologist gave her a Benadryl cream to put on her "rash".  Patient denies any complaints. She's happy that her chest pain she expressed the past has resolved. She continues to utilize her acid reflux medications are prescribed in the past periodically. She's happy to report that she is able to eat acidic food which she tried to avoid in the past. She is pleased that she can begin eating the fruits that she wants used to eat.  Past Medical History  Diagnosis Date  . Cancer   . 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 GERD (gastroesophageal reflux disease) 06/10/2011  . Chest pain 06/10/2011    has Invasive ductal carcinoma of breast; GERD (gastroesophageal reflux disease); and Chest pain on her problem list.      has no known allergies.  Ms. Chapa had no medications administered during this visit.  No past surgical history on file.  Denies any headaches, dizziness, double vision, fevers, chills, night sweats, nausea, vomiting, diarrhea, constipation, chest pain, heart palpitations, shortness of breath, blood in stool, black tarry stool, urinary pain, urinary burning, urinary frequency, hematuria.   PHYSICAL  EXAMINATION  ECOG PERFORMANCE STATUS: 1 - Symptomatic but completely ambulatory  There were no vitals filed for this visit.  GENERAL:alert, no distress, well nourished, well developed, anxious, comfortable, cooperative and smiling SKIN: skin color, texture, turgor are normal, no rashes or significant lesions HEAD: Normocephalic, No masses, lesions, tenderness or abnormalities EYES: normal EARS: External ears normal OROPHARYNX:mucous membranes are moist  NECK: supple, no adenopathy, trachea midline LYMPH:  not examined BREAST:right breast reveals a transverse mastectomy scar.  No abnormal masses or lesions noted.  Right breast reveals an mild to moderate erythematous area located at the site of radiation.  It is not pruritic or painful.  Dry skin noted in this area.  It is also noted on the patients back in the field of radiation.  Right breast also reveals a transverse healed mastectomy scar without any lesions or masses. LUNGS: clear to auscultation and percussion HEART: regular rate & rhythm, no murmurs, no gallops, S1 normal and S2 normal ABDOMEN:abdomen soft, non-tender and normal bowel sounds BACK: Back symmetric, no curvature., No CVA tenderness, left sided erythematous region secondary to radiation EXTREMITIES:less then 2 second capillary refill, no joint deformities, effusion, or inflammation, no edema, no skin discoloration, no clubbing, no cyanosis, right large toe nail is yellow and is about the fall off.   It is not painful.  No puss or foul odor noted. NEURO: alert & oriented x 3 with fluent speech, no focal motor/sensory deficits, gait normal    LABORATORY DATA: Lab Results  Component Value Date   WBC 3.7* 06/18/2011   HGB 10.4*  06/18/2011   HCT 31.6* 06/18/2011   MCV 104.3* 06/18/2011   PLT 218 06/18/2011     Chemistry      Component Value Date/Time   NA 145 04/13/2011 1000   K 3.9 04/13/2011 1000   CL 106 04/13/2011 1000   CO2 31 04/13/2011 1000   BUN 13 04/13/2011 1000     CREATININE <0.47 04/13/2011 1000      Component Value Date/Time   CALCIUM 10.2 04/13/2011 1000   ALKPHOS 56 04/13/2011 1000   AST 15 04/13/2011 1000   ALT 15 04/13/2011 1000   BILITOT 0.2* 04/13/2011 1000       PATHOLOGY: 1. Left breast simple mastectomy: Multicentric invasive ductal carcinoma, grade 3, spanning 2.1 and 0.4 cm. Ductal carcinoma in situ, high-grade. Lymphovascular invasion identified. Surgical resection margins appear negative for carcinoma. 2. Lymph nodes, regional resection, axillary, left: Metastatic carcinoma in 2/8 lymph nodes. MpT2, mpN1a    ASSESSMENT:  1. Invasive ductal carcinoma, S/P chemotherapy.  Now on Herceptin.  Presently receiving radiation. 2. Right foot toenail about to fall off. 3. Right sided thorax erythema secondary to radiation.   PLAN:  1. Soak foot in warm water with baking soda. 2. Apply moisturizing lotion on erythematous region of thorax caused by radiation.   3. Continue to use Benadryl as needed to the radiation induced erythema. 4. Return in 6 weeks for follow-up 5. Continue Herceptin therapy 6. Patient will complete radiation in about 2 weeks time. 7. Patient is scheduled for a scan in approximately 3 weeks time.   All questions were answered. The patient knows to call the clinic with any problems, questions or concerns. We can certainly see the patient much sooner if necessary.  The patient and plan discussed with Glenford Peers, MD and he is in agreement with the aforementioned.  I spent 25 minutes counseling the patient face to face. The total time spent in the appointment was 40 minutes.  Harley Fitzwater

## 2011-06-19 ENCOUNTER — Telehealth (HOSPITAL_COMMUNITY): Payer: Self-pay | Admitting: *Deleted

## 2011-06-19 NOTE — Telephone Encounter (Signed)
Doing fine 24 hrs. Post chemo. No complaints.

## 2011-06-25 ENCOUNTER — Other Ambulatory Visit (HOSPITAL_COMMUNITY): Payer: Self-pay | Admitting: Oncology

## 2011-06-25 DIAGNOSIS — C50919 Malignant neoplasm of unspecified site of unspecified female breast: Secondary | ICD-10-CM

## 2011-06-28 ENCOUNTER — Ambulatory Visit (HOSPITAL_COMMUNITY): Admission: RE | Admit: 2011-06-28 | Payer: Medicare Other | Source: Ambulatory Visit

## 2011-06-28 ENCOUNTER — Ambulatory Visit (HOSPITAL_COMMUNITY)
Admission: RE | Admit: 2011-06-28 | Discharge: 2011-06-28 | Disposition: A | Discharge status: Home / Self Care | Payer: Medicare Other | Source: Ambulatory Visit | Attending: Oncology | Admitting: Oncology

## 2011-06-28 ENCOUNTER — Encounter (HOSPITAL_COMMUNITY): Payer: Self-pay

## 2011-06-28 DIAGNOSIS — C50919 Malignant neoplasm of unspecified site of unspecified female breast: Secondary | ICD-10-CM

## 2011-06-28 DIAGNOSIS — Z923 Personal history of irradiation: Secondary | ICD-10-CM | POA: Insufficient documentation

## 2011-06-28 DIAGNOSIS — J984 Other disorders of lung: Secondary | ICD-10-CM | POA: Insufficient documentation

## 2011-06-28 DIAGNOSIS — Z9221 Personal history of antineoplastic chemotherapy: Secondary | ICD-10-CM | POA: Insufficient documentation

## 2011-07-09 ENCOUNTER — Ambulatory Visit (HOSPITAL_COMMUNITY): Payer: Medicare Other | Admitting: Oncology

## 2011-07-09 ENCOUNTER — Other Ambulatory Visit (HOSPITAL_COMMUNITY): Payer: Medicare Other

## 2011-07-09 ENCOUNTER — Encounter (HOSPITAL_COMMUNITY): Payer: Medicare Other | Attending: Hematology and Oncology

## 2011-07-09 DIAGNOSIS — C50919 Malignant neoplasm of unspecified site of unspecified female breast: Secondary | ICD-10-CM

## 2011-07-09 DIAGNOSIS — Z5111 Encounter for antineoplastic chemotherapy: Secondary | ICD-10-CM

## 2011-07-09 LAB — DIFFERENTIAL
Basophils Absolute: 0 10*3/uL (ref 0.0–0.1)
Basophils Relative: 1 % (ref 0–1)
Eosinophils Absolute: 0.2 10*3/uL (ref 0.0–0.7)
Eosinophils Relative: 6 % — ABNORMAL HIGH (ref 0–5)
Neutrophils Relative %: 63 % (ref 43–77)

## 2011-07-09 LAB — CBC
MCH: 34.3 pg — ABNORMAL HIGH (ref 26.0–34.0)
MCV: 104.2 fL — ABNORMAL HIGH (ref 78.0–100.0)
Platelets: 230 10*3/uL (ref 150–400)
RDW: 13.3 % (ref 11.5–15.5)

## 2011-07-09 MED ORDER — SODIUM CHLORIDE 0.9 % IJ SOLN: 10.0000 mL | INTRAMUSCULAR | Status: DC | PRN

## 2011-07-09 MED ORDER — HEPARIN SOD (PORK) LOCK FLUSH 100 UNIT/ML IV SOLN
INTRAVENOUS | Status: AC
  Administered 2011-07-09: 500 [IU]
  Filled 2011-07-09: qty 5

## 2011-07-09 MED ORDER — TRASTUZUMAB CHEMO INJECTION 440 MG
6.0000 mg/kg | Freq: Once | INTRAVENOUS | Status: AC
  Administered 2011-07-09: 315 mg via INTRAVENOUS
  Filled 2011-07-09: qty 15

## 2011-07-09 MED ORDER — SODIUM CHLORIDE 0.9 % IV SOLN
Freq: Once | INTRAVENOUS | Status: AC
  Administered 2011-07-09: 10:00:00 via INTRAVENOUS

## 2011-07-09 MED ORDER — HEPARIN SOD (PORK) LOCK FLUSH 100 UNIT/ML IV SOLN
500.0000 [IU] | Freq: Once | INTRAVENOUS | Status: AC | PRN
  Administered 2011-07-09: 500 [IU]

## 2011-07-12 ENCOUNTER — Telehealth (HOSPITAL_COMMUNITY): Payer: Self-pay

## 2011-07-12 NOTE — Telephone Encounter (Signed)
Did ok.  Denies n or v.

## 2011-07-30 ENCOUNTER — Encounter (HOSPITAL_COMMUNITY): Payer: Medicare Other

## 2011-07-30 ENCOUNTER — Encounter (HOSPITAL_BASED_OUTPATIENT_CLINIC_OR_DEPARTMENT_OTHER): Payer: Medicare Other

## 2011-07-30 ENCOUNTER — Encounter (HOSPITAL_COMMUNITY): Payer: Medicare Other | Attending: Hematology and Oncology | Admitting: Oncology

## 2011-07-30 VITALS — BP 88/48 | HR 80 | Temp 98.0°F | Wt 112.8 lb

## 2011-07-30 DIAGNOSIS — C50919 Malignant neoplasm of unspecified site of unspecified female breast: Secondary | ICD-10-CM | POA: Insufficient documentation

## 2011-07-30 DIAGNOSIS — Z5112 Encounter for antineoplastic immunotherapy: Secondary | ICD-10-CM

## 2011-07-30 DIAGNOSIS — F32A Depression, unspecified: Secondary | ICD-10-CM

## 2011-07-30 DIAGNOSIS — F329 Major depressive disorder, single episode, unspecified: Secondary | ICD-10-CM

## 2011-07-30 DIAGNOSIS — F3289 Other specified depressive episodes: Secondary | ICD-10-CM | POA: Insufficient documentation

## 2011-07-30 LAB — DIFFERENTIAL
Basophils Absolute: 0 10*3/uL (ref 0.0–0.1)
Lymphocytes Relative: 22 % (ref 12–46)
Monocytes Absolute: 0.6 10*3/uL (ref 0.1–1.0)
Monocytes Relative: 14 % — ABNORMAL HIGH (ref 3–12)
Neutro Abs: 2.2 10*3/uL (ref 1.7–7.7)
Neutrophils Relative %: 55 % (ref 43–77)

## 2011-07-30 LAB — CBC
HCT: 32.1 % — ABNORMAL LOW (ref 36.0–46.0)
Hemoglobin: 10.8 g/dL — ABNORMAL LOW (ref 12.0–15.0)
WBC: 4 10*3/uL (ref 4.0–10.5)

## 2011-07-30 MED ORDER — ESCITALOPRAM OXALATE 10 MG PO TABS: 10.0000 mg | ORAL_TABLET | Freq: Every day | ORAL | Status: DC

## 2011-07-30 MED ORDER — TRASTUZUMAB CHEMO INJECTION 440 MG
6.0000 mg/kg | Freq: Once | INTRAVENOUS | Status: AC
  Administered 2011-07-30: 315 mg via INTRAVENOUS
  Filled 2011-07-30: qty 15

## 2011-07-30 MED ORDER — HEPARIN SOD (PORK) LOCK FLUSH 100 UNIT/ML IV SOLN
500.0000 [IU] | Freq: Once | INTRAVENOUS | Status: DC | PRN
  Filled 2011-07-30: qty 5

## 2011-07-30 MED ORDER — HEPARIN SOD (PORK) LOCK FLUSH 100 UNIT/ML IV SOLN
INTRAVENOUS | Status: AC
  Administered 2011-07-30: 500 [IU]
  Filled 2011-07-30: qty 5

## 2011-07-30 MED ORDER — SODIUM CHLORIDE 0.9 % IV SOLN
Freq: Once | INTRAVENOUS | Status: AC
  Administered 2011-07-30: 11:00:00 via INTRAVENOUS

## 2011-07-30 MED ORDER — SODIUM CHLORIDE 0.9 % IJ SOLN
10.0000 mL | INTRAMUSCULAR | Status: DC | PRN
  Filled 2011-07-30: qty 10

## 2011-07-30 NOTE — Progress Notes (Signed)
Roberta Stalling, MD [address] [place] [address]  1. Invasive ductal carcinoma of breast  2D Echocardiogram without contrast  2. Depression  escitalopram (LEXAPRO) 10 MG tablet    CURRENT THERAPY:S/P 6 cycles of Taxotere and Carboplatin. Now on Herceptin which she will be on for one year. S/P Radiation therapy.   INTERVAL HISTORY: Roberta Gonzalez 70 y.o. female returns for  regular  visit for followup of breast cancer.  The patient requests a refill on her Lexapro.  She finds that when she is not on the medication she cries.  I explained that sometimes a therapeutic cry is the best medicine.  However, she reports that she finds herself crying more frequently than she wishes.  This medication will be refilled for her.  The patient denies any complaints today.  She is pleased that her hair is growing back nicely.  She has a beautiful short haircut presently.  The patient will continue receiving Herceptin every 21 days for one year.   Past Medical History  Diagnosis Date  . GERD (gastroesophageal reflux disease) 06/10/2011  . Chest pain 06/10/2011  . Cancer     jan 2012/mastectomy L; chemo/rad tx  . Invasive ductal carcinoma of breast 06/10/2011    has Invasive ductal carcinoma of breast; GERD (gastroesophageal reflux disease); and Chest pain on her problem list.      has no known allergies.  Ms. Hakim had no medications administered during this visit.  No past surgical history on file.  Denies any headaches, dizziness, double vision, fevers, chills, night sweats, nausea, vomiting, diarrhea, constipation, chest pain, heart palpitations, shortness of breath, blood in stool, black tarry stool, urinary pain, urinary burning, urinary frequency, hematuria.   PHYSICAL EXAMINATION  ECOG PERFORMANCE STATUS: 1 - Symptomatic but completely ambulatory  There were no vitals filed for this visit.  GENERAL:alert, well nourished, well developed, comfortable, cooperative and  smiling SKIN: skin color, texture, turgor are normal HEAD: Normocephalic EYES: normal EARS: External ears normal OROPHARYNX:mucous membranes are moist  NECK: trachea midline LYMPH:  no palpable lymphadenopathy BREAST:B/L mastectomies without any erythema, heat or pain.  No worrisome lesions or masses noted LUNGS: clear to auscultation and percussion HEART: regular rate & rhythm, no murmurs, no gallops, S1 normal and S2 normal ABDOMEN:abdomen soft, non-tender and normal bowel sounds BACK: Back symmetric, no curvature. EXTREMITIES:less then 2 second capillary refill, no joint deformities, effusion, or inflammation, no edema, no skin discoloration, no clubbing, no cyanosis  NEURO: alert & oriented x 3 with fluent speech, no focal motor/sensory deficits, gait normal   LABORATORY DATA: CBC    Component Value Date/Time   WBC 4.0 07/30/2011 1121   RBC 3.11* 07/30/2011 1121   HGB 10.8* 07/30/2011 1121   HCT 32.1* 07/30/2011 1121   PLT 229 07/30/2011 1121   MCV 103.2* 07/30/2011 1121   MCH 34.7* 07/30/2011 1121   MCHC 33.6 07/30/2011 1121   RDW 12.4 07/30/2011 1121   LYMPHSABS 0.9 07/30/2011 1121   MONOABS 0.6 07/30/2011 1121   EOSABS 0.4 07/30/2011 1121   BASOSABS 0.0 07/30/2011 1121      Chemistry      Component Value Date/Time   NA 145 04/13/2011 1000   K 3.9 04/13/2011 1000   CL 106 04/13/2011 1000   CO2 31 04/13/2011 1000   BUN 13 04/13/2011 1000   CREATININE <0.47 04/13/2011 1000      Component Value Date/Time   CALCIUM 10.2 04/13/2011 1000   ALKPHOS 56 04/13/2011 1000  AST 15 04/13/2011 1000   ALT 15 04/13/2011 1000   BILITOT 0.2* 04/13/2011 1000      PATHOLOGY: 1. Left breast simple mastectomy: Multicentric invasive ductal carcinoma, grade 3, spanning 2.1 and 0.4 cm. Ductal carcinoma in situ, high-grade. Lymphovascular invasion identified. Surgical resection margins appear negative for carcinoma.  2. Lymph nodes, regional resection, axillary, left: Metastatic carcinoma in 2/8 lymph nodes.  MpT2,  mpN1a     ASSESSMENT:  1. Invasive ductal carcinoma, S/P chemotherapy. Now on Herceptin. S/P radiation.  2. Depression, on Lexapro 10 mg daily    PLAN:  1. E-prescribed Lexapro 10 mg daily to [facility] with 5 refills. 2. 2D Echocardiogram in October as scheduled 3. Return in 2 months time for follow-up   All questions were answered. The patient knows to call the clinic with any problems, questions or concerns. We can certainly see the patient much sooner if necessary.   [name]

## 2011-08-04 ENCOUNTER — Telehealth (HOSPITAL_COMMUNITY): Payer: Self-pay

## 2011-08-04 NOTE — Telephone Encounter (Signed)
Spoke with daughter who states pt did fine after last tx.  Will call if any problems.

## 2011-08-09 ENCOUNTER — Telehealth (HOSPITAL_COMMUNITY): Payer: Self-pay | Admitting: Oncology

## 2011-08-09 ENCOUNTER — Other Ambulatory Visit (HOSPITAL_COMMUNITY): Payer: Self-pay | Admitting: Oncology

## 2011-08-09 DIAGNOSIS — K219 Gastro-esophageal reflux disease without esophagitis: Secondary | ICD-10-CM

## 2011-08-09 MED ORDER — SUCRALFATE 1 GM/10ML PO SUSP: 1.0000 g | ORAL | Status: DC | PRN

## 2011-08-11 ENCOUNTER — Ambulatory Visit (HOSPITAL_COMMUNITY)
Admission: RE | Admit: 2011-08-11 | Discharge: 2011-08-11 | Disposition: A | Discharge status: Home / Self Care | Payer: Medicare Other | Source: Ambulatory Visit | Attending: Family Medicine | Admitting: Family Medicine

## 2011-08-11 ENCOUNTER — Other Ambulatory Visit (HOSPITAL_COMMUNITY): Payer: Self-pay | Admitting: Family Medicine

## 2011-08-11 DIAGNOSIS — M79652 Pain in left thigh: Secondary | ICD-10-CM

## 2011-08-11 DIAGNOSIS — M25552 Pain in left hip: Secondary | ICD-10-CM

## 2011-08-11 DIAGNOSIS — M949 Disorder of cartilage, unspecified: Secondary | ICD-10-CM | POA: Insufficient documentation

## 2011-08-11 DIAGNOSIS — Z96649 Presence of unspecified artificial hip joint: Secondary | ICD-10-CM

## 2011-08-11 DIAGNOSIS — M899 Disorder of bone, unspecified: Secondary | ICD-10-CM | POA: Insufficient documentation

## 2011-08-11 DIAGNOSIS — M25569 Pain in unspecified knee: Secondary | ICD-10-CM | POA: Insufficient documentation

## 2011-08-11 DIAGNOSIS — M79609 Pain in unspecified limb: Secondary | ICD-10-CM | POA: Insufficient documentation

## 2011-08-20 ENCOUNTER — Other Ambulatory Visit (HOSPITAL_COMMUNITY): Payer: Self-pay | Admitting: Oncology

## 2011-08-20 ENCOUNTER — Encounter (HOSPITAL_BASED_OUTPATIENT_CLINIC_OR_DEPARTMENT_OTHER): Payer: Medicare Other

## 2011-08-20 DIAGNOSIS — F329 Major depressive disorder, single episode, unspecified: Secondary | ICD-10-CM

## 2011-08-20 DIAGNOSIS — Z5111 Encounter for antineoplastic chemotherapy: Secondary | ICD-10-CM

## 2011-08-20 DIAGNOSIS — F32A Depression, unspecified: Secondary | ICD-10-CM

## 2011-08-20 DIAGNOSIS — C50919 Malignant neoplasm of unspecified site of unspecified female breast: Secondary | ICD-10-CM

## 2011-08-20 LAB — COMPREHENSIVE METABOLIC PANEL
ALT: 28 U/L (ref 0–35)
AST: 13 U/L (ref 0–37)
Albumin: 3.7 g/dL (ref 3.5–5.2)
CO2: 28 mEq/L (ref 19–32)
Calcium: 9.4 mg/dL (ref 8.4–10.5)
Chloride: 102 mEq/L (ref 96–112)
GFR calc non Af Amer: >60 mL/min (ref >60)
Sodium: 137 mEq/L (ref 135–145)
Total Bilirubin: 0.4 mg/dL (ref 0.3–1.2)

## 2011-08-20 LAB — CBC
MCHC: 33.5 g/dL (ref 30.0–36.0)
Platelets: 250 10*3/uL (ref 150–400)
RDW: 12.2 % (ref 11.5–15.5)
WBC: 6.8 10*3/uL (ref 4.0–10.5)

## 2011-08-20 LAB — DIFFERENTIAL
Basophils Absolute: 0 10*3/uL (ref 0.0–0.1)
Basophils Relative: 0 % (ref 0–1)
Lymphocytes Relative: 16 % (ref 12–46)
Neutro Abs: 4.7 10*3/uL (ref 1.7–7.7)

## 2011-08-20 MED ORDER — SODIUM CHLORIDE 0.9 % IV SOLN: Freq: Once | INTRAVENOUS | Status: DC

## 2011-08-20 MED ORDER — SODIUM CHLORIDE 0.9 % IJ SOLN
10.0000 mL | INTRAMUSCULAR | Status: DC | PRN
Start: 2011-08-20 — End: 2011-08-20
  Filled 2011-08-20: qty 10

## 2011-08-20 MED ORDER — HEPARIN SOD (PORK) LOCK FLUSH 100 UNIT/ML IV SOLN
500.0000 [IU] | Freq: Once | INTRAVENOUS | Status: AC | PRN
  Administered 2011-08-20: 500 [IU]
  Filled 2011-08-20: qty 5

## 2011-08-20 MED ORDER — TRASTUZUMAB CHEMO INJECTION 440 MG
6.0000 mg/kg | Freq: Once | INTRAVENOUS | Status: AC
  Administered 2011-08-20: 315 mg via INTRAVENOUS
  Filled 2011-08-20: qty 15

## 2011-08-20 MED ORDER — HEPARIN SOD (PORK) LOCK FLUSH 100 UNIT/ML IV SOLN
INTRAVENOUS | Status: AC
  Administered 2011-08-20: 500 [IU]
  Filled 2011-08-20: qty 5

## 2011-08-20 MED ORDER — ESCITALOPRAM OXALATE 10 MG PO TABS: 20.0000 mg | ORAL_TABLET | Freq: Every day | ORAL | Status: DC

## 2011-08-20 NOTE — Progress Notes (Signed)
Port to rt chest accessed via protocol. Pt tolerated well.

## 2011-08-20 NOTE — Progress Notes (Signed)
Tolerated infusion well. 

## 2011-08-21 ENCOUNTER — Telehealth (HOSPITAL_COMMUNITY): Payer: Self-pay

## 2011-08-21 NOTE — Telephone Encounter (Signed)
Chemotherapy Call back:  Per daughter, patient is doing well.  Has experienced no problems following chemotherapy.

## 2011-08-30 ENCOUNTER — Telehealth (HOSPITAL_COMMUNITY): Payer: Self-pay

## 2011-08-30 DIAGNOSIS — F329 Major depressive disorder, single episode, unspecified: Secondary | ICD-10-CM

## 2011-08-30 NOTE — Telephone Encounter (Signed)
Request from [facility] to either obtain Prior Authorization for lexapro 10 mg 2 tabs daily or switch to 20 mg daily.  Per Dr. Mariel Sleet, RX switched to 20 mg daily.  Patient's daughter notified to ensure that patient takes only 1 tablet now instead of 2 tablets daily.

## 2011-09-02 ENCOUNTER — Ambulatory Visit (HOSPITAL_COMMUNITY)
Admission: RE | Admit: 2011-09-02 | Discharge: 2011-09-02 | Disposition: A | Discharge status: Home / Self Care | Payer: Medicare Other | Source: Ambulatory Visit | Attending: Oncology | Admitting: Oncology

## 2011-09-02 DIAGNOSIS — C50919 Malignant neoplasm of unspecified site of unspecified female breast: Secondary | ICD-10-CM

## 2011-09-02 DIAGNOSIS — R079 Chest pain, unspecified: Secondary | ICD-10-CM | POA: Insufficient documentation

## 2011-09-02 DIAGNOSIS — I359 Nonrheumatic aortic valve disorder, unspecified: Secondary | ICD-10-CM

## 2011-09-02 NOTE — Progress Notes (Signed)
*  PRELIMINARY RESULTS* Echocardiogram 2D Echocardiogram has been performed.  Roberta Gonzalez 09/02/2011, 8:39 AM

## 2011-09-10 ENCOUNTER — Encounter (HOSPITAL_COMMUNITY): Payer: Medicare Other | Attending: Hematology and Oncology

## 2011-09-10 VITALS — BP 99/44 | HR 86 | Temp 97.6°F | Ht 62.0 in | Wt 115.2 lb

## 2011-09-10 DIAGNOSIS — C50919 Malignant neoplasm of unspecified site of unspecified female breast: Secondary | ICD-10-CM | POA: Insufficient documentation

## 2011-09-10 DIAGNOSIS — Z5112 Encounter for antineoplastic immunotherapy: Secondary | ICD-10-CM

## 2011-09-10 LAB — CBC
MCV: 104 fL — ABNORMAL HIGH (ref 78.0–100.0)
Platelets: 293 10*3/uL (ref 150–400)
RDW: 12.5 % (ref 11.5–15.5)
WBC: 4.3 10*3/uL (ref 4.0–10.5)

## 2011-09-10 LAB — DIFFERENTIAL
Basophils Absolute: 0 10*3/uL (ref 0.0–0.1)
Eosinophils Absolute: 0.2 10*3/uL (ref 0.0–0.7)
Eosinophils Relative: 4 % (ref 0–5)
Lymphocytes Relative: 20 % (ref 12–46)

## 2011-09-10 MED ORDER — HEPARIN SOD (PORK) LOCK FLUSH 100 UNIT/ML IV SOLN
INTRAVENOUS | Status: AC
  Administered 2011-09-10: 500 [IU]
  Filled 2011-09-10: qty 5

## 2011-09-10 MED ORDER — HEPARIN SOD (PORK) LOCK FLUSH 100 UNIT/ML IV SOLN
250.0000 [IU] | Freq: Once | INTRAVENOUS | Status: DC | PRN
  Filled 2011-09-10: qty 5

## 2011-09-10 MED ORDER — SODIUM CHLORIDE 0.9 % IV SOLN
Freq: Once | INTRAVENOUS | Status: AC
  Administered 2011-09-10: 10:00:00 via INTRAVENOUS

## 2011-09-10 MED ORDER — TRASTUZUMAB CHEMO INJECTION 440 MG
6.0000 mg/kg | Freq: Once | INTRAVENOUS | Status: AC
  Administered 2011-09-10: 315 mg via INTRAVENOUS
  Filled 2011-09-10: qty 15

## 2011-09-10 NOTE — Progress Notes (Signed)
Good blood return from port.   Tolerated herceptin  infusion well.

## 2011-09-14 ENCOUNTER — Other Ambulatory Visit (HOSPITAL_COMMUNITY): Payer: Self-pay | Admitting: Family Medicine

## 2011-09-14 DIAGNOSIS — M545 Low back pain: Secondary | ICD-10-CM

## 2011-09-15 ENCOUNTER — Telehealth (HOSPITAL_COMMUNITY): Payer: Self-pay

## 2011-09-15 NOTE — Telephone Encounter (Signed)
Post chemotherapy follow-up call:  Per daughter, doing well no problems encountered.

## 2011-09-16 ENCOUNTER — Ambulatory Visit (HOSPITAL_COMMUNITY)
Admission: RE | Admit: 2011-09-16 | Discharge: 2011-09-16 | Disposition: A | Discharge status: Home / Self Care | Payer: Medicare Other | Source: Ambulatory Visit | Attending: Family Medicine | Admitting: Family Medicine

## 2011-09-16 ENCOUNTER — Encounter (HOSPITAL_COMMUNITY): Payer: Self-pay

## 2011-09-16 DIAGNOSIS — M545 Low back pain, unspecified: Secondary | ICD-10-CM | POA: Insufficient documentation

## 2011-09-16 DIAGNOSIS — M5126 Other intervertebral disc displacement, lumbar region: Secondary | ICD-10-CM | POA: Insufficient documentation

## 2011-09-16 DIAGNOSIS — M51379 Other intervertebral disc degeneration, lumbosacral region without mention of lumbar back pain or lower extremity pain: Secondary | ICD-10-CM | POA: Insufficient documentation

## 2011-09-16 DIAGNOSIS — M5137 Other intervertebral disc degeneration, lumbosacral region: Secondary | ICD-10-CM | POA: Insufficient documentation

## 2011-10-01 ENCOUNTER — Encounter (HOSPITAL_BASED_OUTPATIENT_CLINIC_OR_DEPARTMENT_OTHER): Payer: Medicare Other

## 2011-10-01 ENCOUNTER — Encounter (HOSPITAL_COMMUNITY): Payer: Medicare Other | Attending: Hematology and Oncology | Admitting: Oncology

## 2011-10-01 VITALS — BP 108/66 | HR 80 | Temp 98.6°F | Wt 115.6 lb

## 2011-10-01 VITALS — BP 106/67 | HR 67

## 2011-10-01 DIAGNOSIS — C50319 Malignant neoplasm of lower-inner quadrant of unspecified female breast: Secondary | ICD-10-CM

## 2011-10-01 DIAGNOSIS — C50919 Malignant neoplasm of unspecified site of unspecified female breast: Secondary | ICD-10-CM

## 2011-10-01 DIAGNOSIS — Z5112 Encounter for antineoplastic immunotherapy: Secondary | ICD-10-CM

## 2011-10-01 LAB — DIFFERENTIAL
Basophils Absolute: 0 10*3/uL (ref 0.0–0.1)
Basophils Relative: 1 % (ref 0–1)
Eosinophils Absolute: 0.2 10*3/uL (ref 0.0–0.7)
Monocytes Relative: 12 % (ref 3–12)
Neutro Abs: 2.4 10*3/uL (ref 1.7–7.7)
Neutrophils Relative %: 60 % (ref 43–77)

## 2011-10-01 LAB — CBC
Hemoglobin: 11 g/dL — ABNORMAL LOW (ref 12.0–15.0)
MCH: 33.8 pg (ref 26.0–34.0)
MCHC: 32.9 g/dL (ref 30.0–36.0)
Platelets: 226 10*3/uL (ref 150–400)
RDW: 12.3 % (ref 11.5–15.5)

## 2011-10-01 MED ORDER — HEPARIN SOD (PORK) LOCK FLUSH 100 UNIT/ML IV SOLN
INTRAVENOUS | Status: AC
  Filled 2011-10-01: qty 5

## 2011-10-01 MED ORDER — HEPARIN SOD (PORK) LOCK FLUSH 100 UNIT/ML IV SOLN
500.0000 [IU] | Freq: Once | INTRAVENOUS | Status: AC | PRN
  Administered 2011-10-01: 500 [IU]
  Filled 2011-10-01: qty 5

## 2011-10-01 MED ORDER — SODIUM CHLORIDE 0.9 % IV SOLN
6.0000 mg/kg | Freq: Once | INTRAVENOUS | Status: AC
  Administered 2011-10-01: 315 mg via INTRAVENOUS
  Filled 2011-10-01: qty 15

## 2011-10-01 MED ORDER — SODIUM CHLORIDE 0.9 % IV SOLN
Freq: Once | INTRAVENOUS | Status: AC
  Administered 2011-10-01: 500 mL via INTRAVENOUS

## 2011-10-01 NOTE — Progress Notes (Signed)
Roberta Stalling, MD [address] [place] [address]  1. Invasive ductal carcinoma of breast  naproxen (NAPROSYN) 500 MG tablet, SCHEDULING COMMUNICATION, 0.9 %  sodium chloride infusion, heparin lock flush 100 unit/mL, trastuzumab (HERCEPTIN) 315 mg in sodium chloride 0.9 % 250 mL chemo infusion, 2D Echocardiogram without contrast    CURRENT THERAPY: S/P 6 cycles of Taxotere and Carboplatin. Now on Herceptin which she will be on for one year. S/P 13 cycles of Herceptin.  S/P Radiation therapy.   INTERVAL HISTORY: Roberta Gonzalez 70 y.o. female returns for  regular  visit for followup of  breast cancer.   The patient denies any complaints.  She reports that she is tolerating therapy well.  I personally reviewed and went over laboratory results with the patient.   I personally reviewed and went over radiographic studies with the patient.  Her ejection fraction is stable at 50-55%.  The patient's daughter is ill.  I stressed the importance of good hand hygiene.  I suggested that she contact the office if she feels ill.  The patient is due to receive her 14th cycle of herceptin today.  Past Medical History  Diagnosis Date  . GERD (gastroesophageal reflux disease) 06/10/2011  . Chest pain 06/10/2011  . Cancer     jan 2012/mastectomy L; chemo/rad tx  . Invasive ductal carcinoma of breast 06/10/2011    has Invasive ductal carcinoma of breast; GERD (gastroesophageal reflux disease); and Chest pain on her problem list.      has no known allergies.  Ms. Gilvin had no medications administered during this visit.  No past surgical history on file.  Denies any headaches, dizziness, double vision, fevers, chills, night sweats, nausea, vomiting, diarrhea, constipation, chest pain, heart palpitations, shortness of breath, blood in stool, black tarry stool, urinary pain, urinary burning, urinary frequency, hematuria.   PHYSICAL EXAMINATION  ECOG PERFORMANCE STATUS: 1 - Symptomatic  but completely ambulatory  Filed Vitals:   10/01/11 0907  BP: 108/66  Pulse: 80  Temp: 98.6 F (37 C)    GENERAL:alert, no distress, well nourished, well developed, comfortable, cooperative and smiling SKIN: skin color, texture, turgor are normal HEAD: Normocephalic EYES: normal EARS: External ears normal OROPHARYNX:mucous membranes are moist  NECK: supple, no adenopathy, no bruits, thyroid normal size, non-tender, without nodularity, no stridor, non-tender, trachea midline, right sided shunt appreciated in the superior portion of right posterior cervical chain crossing into the right anterior cervical chain and traversing over the right clavicle. LYMPH:  no palpable lymphadenopathy, no hepatosplenomegaly BREAST:B/L post-mastectomy site well healed and free of suspicious changes LUNGS: clear to auscultation and percussion HEART: regular rate & rhythm, no murmurs, no gallops, S1 normal and S2 normal ABDOMEN:abdomen soft, non-tender, normal bowel sounds and no hepatosplenomegaly BACK: Back symmetric, no curvature., No CVA tenderness EXTREMITIES:less then 2 second capillary refill, no joint deformities, effusion, or inflammation, no edema, no skin discoloration, no clubbing, no cyanosis  NEURO: alert & oriented x 3 with fluent speech, no focal motor/sensory deficits, gait normal   LABORATORY DATA: CBC    Component Value Date/Time   WBC 4.0 10/01/2011 1035   RBC 3.25* 10/01/2011 1035   HGB 11.0* 10/01/2011 1035   HCT 33.4* 10/01/2011 1035   PLT 226 10/01/2011 1035   MCV 102.8* 10/01/2011 1035   MCH 33.8 10/01/2011 1035   MCHC 32.9 10/01/2011 1035   RDW 12.3 10/01/2011 1035   LYMPHSABS 0.9 10/01/2011 1035   MONOABS 0.5 10/01/2011 1035   EOSABS 0.2 10/01/2011  1035   BASOSABS 0.0 10/01/2011 1035      Chemistry      Component Value Date/Time   NA 137 08/20/2011 0914   K 3.8 08/20/2011 0914   CL 102 08/20/2011 0914   CO2 28 08/20/2011 0914   BUN 18 08/20/2011 0914   CREATININE 0.48*  08/20/2011 0914      Component Value Date/Time   CALCIUM 9.4 08/20/2011 0914   ALKPHOS 45 08/20/2011 0914   AST 13 08/20/2011 0914   ALT 28 08/20/2011 0914   BILITOT 0.4 08/20/2011 0914       RADIOGRAPHIC STUDIES:  09/02/11  Transthoracic Echocardiography  Study Conclusions  - Left ventricle: The cavity size was normal. Wall thickness was normal. Systolic function was normal. The estimated ejection fraction was in the range of 50% to 55%. Although no diagnostic regional wall motion abnormality was identified, this possibility cannot be completely excluded on the basis of this study. Doppler parameters are consistent with abnormal left ventricular relaxation (grade 1 diastolic dysfunction). - Aortic valve: Mildly calcified annulus. Probably trileaflet; mildly calcified leaflets. Mild regurgitation. - Mitral valve: Mildly thickened leaflets . Mild regurgitation. - Tricuspid valve: Mild regurgitation. - Pulmonary arteries: PA peak pressure: 28mm Hg (S). - Pericardium, extracardiac: A prominent pericardial fat pad was present. Transthoracic echocardiography. M-mode, complete 2D, spectral Doppler, and color Doppler. Height: Height: 157.5cm. Height: 62in. Weight: Weight: 50.8kg. Weight: 111.8lb. Body mass index: BMI: 20.5kg/m^2. Body surface area: BSA: 1.86m^2. Patient status: Outpatient. Location: Echo laboratory.   PATHOLOGY: 1. Left breast simple mastectomy: Multicentric invasive ductal carcinoma, grade 3, spanning 2.1 and 0.4 cm. Ductal carcinoma in situ, high-grade. Lymphovascular invasion identified. Surgical resection margins appear negative for carcinoma.  2. Lymph nodes, regional resection, axillary, left: Metastatic carcinoma in 2/8 lymph nodes.  MpT2, mpN1a    ASSESSMENT:  1. Invasive ductal carcinoma, S/P chemotherapy. Now on Herceptin. S/P radiation.  2. Depression, on Lexapro 10 mg daily   PLAN:  1. Return in 3 months for follow-up. 2. I personally reviewed  and went over radiographic studies with the patient. 3. I personally reviewed and went over laboratory results with the patient. 4. Stressed good hand hygiene.  I have asked her to call the clinic if she begins to feel ill.   All questions were answered. The patient knows to call the clinic with any problems, questions or concerns. We can certainly see the patient much sooner if necessary.  The patient and plan discussed with Glenford Peers, MD and he is in agreement with the aforementioned.  I spent 20 minutes counseling the patient face to face. The total time spent in the appointment was 25 minutes.  Kalene Cutler

## 2011-10-01 NOTE — Patient Instructions (Signed)
Roberta Gonzalez  [id number] January 17, 1941   [facility]  Discharge Instructions  RECOMMENDATIONS MADE BY THE CONSULTANT AND ANY TEST RESULTS WILL BE SENT TO YOUR REFERRING DOCTOR.   EXAM FINDINGS BY MD TODAY AND SIGNS AND SYMPTOMS TO REPORT TO CLINIC OR PRIMARY MD: You are doing well.  Report any new lumps, bone pain or shortness of breasth  MEDICATIONS PRESCRIBED: none      SPECIAL INSTRUCTIONS/FOLLOW-UP: Return to Clinic on Mid January for 2 D Echocardiogram and 3 months to see MD.  [name] as scheduled.   I acknowledge that I have been informed and understand all the instructions given to me and received a copy. I do not have any more questions at this time, but understand that I may call the Specialty Clinic at [facility] at [phone] during business hours should I have any further questions or need assistance in obtaining follow-up care.    __________________________________________  _____________  __________ Signature of Patient or Authorized Representative            Date                   Time    __________________________________________ Nurse's Signature

## 2011-10-22 ENCOUNTER — Encounter (HOSPITAL_BASED_OUTPATIENT_CLINIC_OR_DEPARTMENT_OTHER): Payer: Medicare Other

## 2011-10-22 VITALS — BP 95/54 | HR 72 | Temp 98.2°F | Wt 118.4 lb

## 2011-10-22 DIAGNOSIS — C50319 Malignant neoplasm of lower-inner quadrant of unspecified female breast: Secondary | ICD-10-CM

## 2011-10-22 DIAGNOSIS — C50919 Malignant neoplasm of unspecified site of unspecified female breast: Secondary | ICD-10-CM

## 2011-10-22 DIAGNOSIS — Z5112 Encounter for antineoplastic immunotherapy: Secondary | ICD-10-CM

## 2011-10-22 LAB — COMPREHENSIVE METABOLIC PANEL
AST: 15 U/L (ref 0–37)
Albumin: 3.5 g/dL (ref 3.5–5.2)
Calcium: 9.2 mg/dL (ref 8.4–10.5)
Chloride: 103 mEq/L (ref 96–112)
Creatinine, Ser: 0.56 mg/dL (ref 0.50–1.10)
Sodium: 138 mEq/L (ref 135–145)
Total Bilirubin: 0.3 mg/dL (ref 0.3–1.2)

## 2011-10-22 LAB — DIFFERENTIAL
Basophils Absolute: 0 10*3/uL (ref 0.0–0.1)
Basophils Relative: 1 % (ref 0–1)
Monocytes Absolute: 0.7 10*3/uL (ref 0.1–1.0)
Neutro Abs: 2.9 10*3/uL (ref 1.7–7.7)

## 2011-10-22 LAB — CBC
HCT: 33.6 % — ABNORMAL LOW (ref 36.0–46.0)
MCHC: 33.3 g/dL (ref 30.0–36.0)
Platelets: 231 10*3/uL (ref 150–400)
RDW: 12 % (ref 11.5–15.5)

## 2011-10-22 MED ORDER — SODIUM CHLORIDE 0.9 % IJ SOLN
INTRAMUSCULAR | Status: AC
  Filled 2011-10-22: qty 10

## 2011-10-22 MED ORDER — SODIUM CHLORIDE 0.9 % IJ SOLN
10.0000 mL | INTRAMUSCULAR | Status: DC | PRN
  Filled 2011-10-22: qty 10

## 2011-10-22 MED ORDER — TRASTUZUMAB CHEMO INJECTION 440 MG
6.0000 mg/kg | Freq: Once | INTRAVENOUS | Status: AC
  Administered 2011-10-22: 315 mg via INTRAVENOUS
  Filled 2011-10-22: qty 15

## 2011-10-22 MED ORDER — HEPARIN SOD (PORK) LOCK FLUSH 100 UNIT/ML IV SOLN
500.0000 [IU] | Freq: Once | INTRAVENOUS | Status: DC | PRN
  Filled 2011-10-22: qty 5

## 2011-10-22 MED ORDER — HEPARIN SOD (PORK) LOCK FLUSH 100 UNIT/ML IV SOLN
INTRAVENOUS | Status: AC
  Filled 2011-10-22: qty 5

## 2011-10-22 MED ORDER — SODIUM CHLORIDE 0.9 % IV SOLN
Freq: Once | INTRAVENOUS | Status: AC
  Administered 2011-10-22: 10:00:00 via INTRAVENOUS

## 2011-10-22 NOTE — Progress Notes (Signed)
1200-  Infusion complete, patient tolerated well.

## 2011-11-12 ENCOUNTER — Encounter (HOSPITAL_COMMUNITY): Payer: Medicare Other | Attending: Hematology and Oncology

## 2011-11-12 VITALS — BP 107/67 | HR 88 | Temp 97.9°F | Wt 117.8 lb

## 2011-11-12 DIAGNOSIS — Z5112 Encounter for antineoplastic immunotherapy: Secondary | ICD-10-CM

## 2011-11-12 DIAGNOSIS — C50319 Malignant neoplasm of lower-inner quadrant of unspecified female breast: Secondary | ICD-10-CM

## 2011-11-12 DIAGNOSIS — C50919 Malignant neoplasm of unspecified site of unspecified female breast: Secondary | ICD-10-CM | POA: Insufficient documentation

## 2011-11-12 MED ORDER — TRASTUZUMAB CHEMO INJECTION 440 MG
6.0000 mg/kg | Freq: Once | INTRAVENOUS | Status: AC
  Administered 2011-11-12: 315 mg via INTRAVENOUS
  Filled 2011-11-12: qty 15

## 2011-11-12 MED ORDER — HEPARIN SOD (PORK) LOCK FLUSH 100 UNIT/ML IV SOLN
500.0000 [IU] | Freq: Once | INTRAVENOUS | Status: AC | PRN
  Administered 2011-11-12: 500 [IU]
  Filled 2011-11-12: qty 5

## 2011-11-12 MED ORDER — HEPARIN SOD (PORK) LOCK FLUSH 100 UNIT/ML IV SOLN
INTRAVENOUS | Status: AC
  Administered 2011-11-12: 500 [IU]
  Filled 2011-11-12: qty 5

## 2011-11-12 MED ORDER — SODIUM CHLORIDE 0.9 % IV SOLN
Freq: Once | INTRAVENOUS | Status: AC
  Administered 2011-11-12: 09:00:00 via INTRAVENOUS

## 2011-11-18 ENCOUNTER — Other Ambulatory Visit (HOSPITAL_COMMUNITY): Payer: Self-pay | Admitting: Oncology

## 2011-11-18 ENCOUNTER — Telehealth (HOSPITAL_COMMUNITY): Payer: Self-pay | Admitting: *Deleted

## 2011-11-18 DIAGNOSIS — K219 Gastro-esophageal reflux disease without esophagitis: Secondary | ICD-10-CM

## 2011-11-18 MED ORDER — OMEPRAZOLE 20 MG PO CPDR: 20.0000 mg | DELAYED_RELEASE_CAPSULE | Freq: Every day | ORAL | Status: DC

## 2011-12-03 ENCOUNTER — Encounter (HOSPITAL_COMMUNITY): Payer: Medicare Other | Attending: Hematology and Oncology

## 2011-12-03 VITALS — BP 113/69 | HR 91 | Temp 97.9°F | Wt 116.6 lb

## 2011-12-03 DIAGNOSIS — Z5111 Encounter for antineoplastic chemotherapy: Secondary | ICD-10-CM

## 2011-12-03 DIAGNOSIS — C50319 Malignant neoplasm of lower-inner quadrant of unspecified female breast: Secondary | ICD-10-CM

## 2011-12-03 DIAGNOSIS — C50919 Malignant neoplasm of unspecified site of unspecified female breast: Secondary | ICD-10-CM

## 2011-12-03 MED ORDER — HEPARIN SOD (PORK) LOCK FLUSH 100 UNIT/ML IV SOLN
INTRAVENOUS | Status: AC
  Filled 2011-12-03: qty 5

## 2011-12-03 MED ORDER — SODIUM CHLORIDE 0.9 % IJ SOLN
10.0000 mL | INTRAMUSCULAR | Status: DC | PRN
  Administered 2011-12-03: 10 mL
  Filled 2011-12-03: qty 10

## 2011-12-03 MED ORDER — HEPARIN SOD (PORK) LOCK FLUSH 100 UNIT/ML IV SOLN
500.0000 [IU] | Freq: Once | INTRAVENOUS | Status: AC | PRN
Start: 2011-12-03 — End: 2011-12-03
  Administered 2011-12-03: 500 [IU]
  Filled 2011-12-03: qty 5

## 2011-12-03 MED ORDER — SODIUM CHLORIDE 0.9 % IV SOLN
6.0000 mg/kg | Freq: Once | INTRAVENOUS | Status: AC
  Administered 2011-12-03: 315 mg via INTRAVENOUS
  Filled 2011-12-03: qty 15

## 2011-12-03 MED ORDER — SODIUM CHLORIDE 0.9 % IV SOLN
Freq: Once | INTRAVENOUS | Status: AC
  Administered 2011-12-03: 11:00:00 via INTRAVENOUS

## 2011-12-03 MED ORDER — SODIUM CHLORIDE 0.9 % IJ SOLN
INTRAMUSCULAR | Status: AC
  Filled 2011-12-03: qty 10

## 2011-12-03 NOTE — Progress Notes (Signed)
Infusion complete, patient tolerated well.   

## 2011-12-20 ENCOUNTER — Ambulatory Visit (HOSPITAL_COMMUNITY)
Admission: RE | Admit: 2011-12-20 | Discharge: 2011-12-20 | Disposition: A | Discharge status: Home / Self Care | Payer: Medicare Other | Source: Ambulatory Visit | Attending: Oncology | Admitting: Oncology

## 2011-12-20 DIAGNOSIS — C50919 Malignant neoplasm of unspecified site of unspecified female breast: Secondary | ICD-10-CM | POA: Insufficient documentation

## 2011-12-20 DIAGNOSIS — Z9221 Personal history of antineoplastic chemotherapy: Secondary | ICD-10-CM | POA: Insufficient documentation

## 2011-12-20 DIAGNOSIS — Z09 Encounter for follow-up examination after completed treatment for conditions other than malignant neoplasm: Secondary | ICD-10-CM

## 2011-12-20 DIAGNOSIS — R079 Chest pain, unspecified: Secondary | ICD-10-CM | POA: Insufficient documentation

## 2011-12-20 NOTE — Progress Notes (Signed)
*  PRELIMINARY RESULTS* Echocardiogram 2D Echocardiogram has been performed.  Conrad Adams 12/20/2011, 9:45 AM

## 2011-12-22 ENCOUNTER — Other Ambulatory Visit (HOSPITAL_COMMUNITY): Payer: Self-pay | Admitting: Oncology

## 2011-12-30 ENCOUNTER — Ambulatory Visit (HOSPITAL_COMMUNITY): Payer: Medicare Other | Admitting: Oncology

## 2012-01-03 ENCOUNTER — Ambulatory Visit (HOSPITAL_COMMUNITY): Payer: Medicare Other | Admitting: Oncology

## 2012-01-03 ENCOUNTER — Inpatient Hospital Stay (HOSPITAL_COMMUNITY): Payer: Medicare Other

## 2012-01-03 ENCOUNTER — Encounter (HOSPITAL_COMMUNITY): Payer: Self-pay

## 2012-01-03 ENCOUNTER — Encounter (HOSPITAL_COMMUNITY): Payer: Medicare Other | Attending: Hematology and Oncology | Admitting: Oncology

## 2012-01-03 VITALS — BP 100/61 | HR 81 | Temp 98.5°F | Wt 118.1 lb

## 2012-01-03 DIAGNOSIS — C50319 Malignant neoplasm of lower-inner quadrant of unspecified female breast: Secondary | ICD-10-CM

## 2012-01-03 DIAGNOSIS — I878 Other specified disorders of veins: Secondary | ICD-10-CM

## 2012-01-03 DIAGNOSIS — G5790 Unspecified mononeuropathy of unspecified lower limb: Secondary | ICD-10-CM

## 2012-01-03 DIAGNOSIS — I998 Other disorder of circulatory system: Secondary | ICD-10-CM | POA: Insufficient documentation

## 2012-01-03 MED ORDER — SODIUM CHLORIDE 0.9 % IJ SOLN
INTRAMUSCULAR | Status: AC
  Administered 2012-01-03: 10 mL via INTRAVENOUS
  Filled 2012-01-03: qty 10

## 2012-01-03 MED ORDER — SODIUM CHLORIDE 0.9 % IJ SOLN
10.0000 mL | INTRAMUSCULAR | Status: DC | PRN
  Administered 2012-01-03: 10 mL via INTRAVENOUS
  Filled 2012-01-03: qty 10

## 2012-01-03 MED ORDER — HEPARIN SOD (PORK) LOCK FLUSH 100 UNIT/ML IV SOLN
500.0000 [IU] | Freq: Once | INTRAVENOUS | Status: AC
  Administered 2012-01-03: 500 [IU] via INTRAVENOUS
  Filled 2012-01-03: qty 5

## 2012-01-03 MED ORDER — HEPARIN SOD (PORK) LOCK FLUSH 100 UNIT/ML IV SOLN
INTRAVENOUS | Status: AC
  Administered 2012-01-03: 500 [IU] via INTRAVENOUS
  Filled 2012-01-03: qty 5

## 2012-01-03 NOTE — Progress Notes (Signed)
CC:   Roberta Gonzalez, M.D. Roberta Gonzalez, Ph.D., M.D. Roberta Novas, MD  DIAGNOSES: 1. 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  HER2 was positive with a ratio of 2.09.     Primary was 2.1 cm.  She had a 2nd lesion that was 0.4 cm.     Underwent mastectomy 11/27/2010.  She therefore had a T2 N1 MX     cancer.  She has had 6 cycles of carboplatin with Taxotere and 52     weeks of Herceptin. 2. Gastroesophageal reflux disease. 3. Peripheral neuropathy of the legs more on the left than the right     with weakness and pain as well on the left in particular times 4-5     years she states.  She states that she saw a neurologist in [place]     who could not find the reason for this.  She has not seen anyone     here in the form of a neurologist but is going to work on that. 4. Scoliosis according to recent CT scan 2012. 5. Hypercholesterolemia. 6. Borderline diabetes mellitus. 7. Codeine and sulfonamide allergies. 8. History of vertigo. 9. History of normal pressure hydrocephalus status post shunt     placement in the past. 10.History of right mastectomy in 2001.  Roberta Gonzalez is here today with her daughter.  Roberta Gonzalez has a very poor performance status; it is at least a 3 going on a 4.  She gets out of bed, gets herself dressed, gets herself in the shower at times, cannot get out of a bathtub, fixes herself breakfast.  That is about all she does during the day besides play on her phone and watch TV.  According to her and her daughter, she actually tripped the other day on her cane tip, fell, has a blackened purplish eye and some abrasions over the right forehead that occurred last week.  She states that she had some blood tests last week by Dr. Otilio Gonzalez office; we will try to get those results.  She is not  aware of any new lumps or bumps anywhere.  This pain in the left leg, numbness in both feet and both hands, she states is unclear as to etiology.  I really do think she needs to see a neurologist.  These were clearly present, she states, before the chemotherapy and did not get worse with the chemotherapy that she is aware of.  EXAM:  Vital Signs:  Today she has a weight of 118 pounds which is stable.  Blood pressure 1/161 right arm sitting position.  Pulse 80 and regular.  Respirations 16 and unlabored.  She is afebrile and denies any pain presently.  She has no lymphadenopathy.  Both chest walls are clear.  Her lungs are clear to auscultation and percussion.  She has no thyromegaly.  Heart:  Regular rhythm and rate without obvious murmur or gallop.  Abdomen:  Soft, nontender, without hepatosplenomegaly.  She has no peripheral edema.  Roberta Gonzalez actually is 26 but she looks closer to 77 or 79. We will see her in 6 months.  Continue observation.  She states that she has had a bone density as well but I cannot find that in the system.  I think that should be watched  as well.    ______________________________ Roberta Gonzalez. Roberta Sleet, MD ESN/MEDQ  D:  01/03/2012  T:  01/03/2012  Job:  [id number]

## 2012-01-03 NOTE — Patient Instructions (Signed)
Roberta Gonzalez  [id number] October 21, 1941   [facility]  Discharge Instructions  RECOMMENDATIONS MADE BY THE CONSULTANT AND ANY TEST RESULTS WILL BE SENT TO YOUR REFERRING DOCTOR.   EXAM FINDINGS BY MD TODAY AND SIGNS AND SYMPTOMS TO REPORT TO CLINIC OR PRIMARY MD: If you port is bothering you, you can get it out otherwise, we would like to keep it in for about 3 years.  See neurologist about your back.  MEDICATIONS PRESCRIBED: none   INSTRUCTIONS GIVEN AND DISCUSSED: Other :   Report any new lumps, bone pain or shortness of breath.  SPECIAL INSTRUCTIONS/FOLLOW-UP: Return to Clinic every 6 weeks for port flush and in 6 months to see PA.   I acknowledge that I have been informed and understand all the instructions given to me and received a copy. I do not have any more questions at this time, but understand that I may call the Specialty Clinic at [facility] at [phone] during business hours should I have any further questions or need assistance in obtaining follow-up care.    __________________________________________  _____________  __________ Signature of Patient or Authorized Representative            Date                   Time    __________________________________________ Nurse's Signature

## 2012-01-03 NOTE — Progress Notes (Signed)
This office note has been dictated.

## 2012-01-03 NOTE — Progress Notes (Signed)
Roberta Gonzalez presented for Portacath access and flush. Proper placement of portacath confirmed by CXR. Portacath located right chest wall accessed with  H 20 needle. Good blood return present. Portacath flushed with 20ml NS and 500U/5ml Heparin and needle removed intact. Procedure without incident. Patient tolerated procedure well.   

## 2012-02-14 ENCOUNTER — Encounter (HOSPITAL_COMMUNITY): Payer: Medicare Other | Attending: Hematology and Oncology

## 2012-02-14 DIAGNOSIS — I998 Other disorder of circulatory system: Secondary | ICD-10-CM | POA: Insufficient documentation

## 2012-02-14 DIAGNOSIS — Z452 Encounter for adjustment and management of vascular access device: Secondary | ICD-10-CM

## 2012-02-14 DIAGNOSIS — C50319 Malignant neoplasm of lower-inner quadrant of unspecified female breast: Secondary | ICD-10-CM

## 2012-02-14 MED ORDER — SODIUM CHLORIDE 0.9 % IJ SOLN
INTRAMUSCULAR | Status: AC
  Filled 2012-02-14: qty 10

## 2012-02-14 MED ORDER — HEPARIN SOD (PORK) LOCK FLUSH 100 UNIT/ML IV SOLN
INTRAVENOUS | Status: AC
  Filled 2012-02-14: qty 5

## 2012-02-14 NOTE — Progress Notes (Signed)
Tolerated port flush well. 

## 2012-03-05 ENCOUNTER — Other Ambulatory Visit (HOSPITAL_COMMUNITY): Payer: Self-pay | Admitting: Oncology

## 2012-03-27 ENCOUNTER — Encounter (HOSPITAL_COMMUNITY): Payer: Medicare Other | Attending: Hematology and Oncology

## 2012-03-27 DIAGNOSIS — Z95828 Presence of other vascular implants and grafts: Secondary | ICD-10-CM

## 2012-03-27 DIAGNOSIS — Z452 Encounter for adjustment and management of vascular access device: Secondary | ICD-10-CM

## 2012-03-27 DIAGNOSIS — C50319 Malignant neoplasm of lower-inner quadrant of unspecified female breast: Secondary | ICD-10-CM

## 2012-03-27 DIAGNOSIS — Z9889 Other specified postprocedural states: Secondary | ICD-10-CM | POA: Insufficient documentation

## 2012-03-27 MED ORDER — HEPARIN SOD (PORK) LOCK FLUSH 100 UNIT/ML IV SOLN
INTRAVENOUS | Status: AC
  Filled 2012-03-27: qty 5

## 2012-03-27 MED ORDER — HEPARIN SOD (PORK) LOCK FLUSH 100 UNIT/ML IV SOLN
500.0000 [IU] | Freq: Once | INTRAVENOUS | Status: AC
  Administered 2012-03-27: 500 [IU] via INTRAVENOUS
  Filled 2012-03-27: qty 5

## 2012-03-27 MED ORDER — SODIUM CHLORIDE 0.9 % IJ SOLN
10.0000 mL | INTRAMUSCULAR | Status: DC | PRN
  Administered 2012-03-27: 10 mL via INTRAVENOUS
  Filled 2012-03-27: qty 10

## 2012-03-27 NOTE — Progress Notes (Signed)
Roberta Gonzalez presented for Portacath access and flush. Proper placement of portacath confirmed by CXR. Portacath located rt chest wall accessed with  H 20 needle. Good blood return present. Portacath flushed with 20ml NS and 500U/5ml Heparin and needle removed intact. Procedure without incident. Patient tolerated procedure well.   

## 2012-05-08 ENCOUNTER — Encounter (HOSPITAL_COMMUNITY): Payer: Medicare Other | Attending: Hematology and Oncology

## 2012-05-08 DIAGNOSIS — Z452 Encounter for adjustment and management of vascular access device: Secondary | ICD-10-CM

## 2012-05-08 DIAGNOSIS — C50919 Malignant neoplasm of unspecified site of unspecified female breast: Secondary | ICD-10-CM | POA: Insufficient documentation

## 2012-05-08 DIAGNOSIS — C50319 Malignant neoplasm of lower-inner quadrant of unspecified female breast: Secondary | ICD-10-CM

## 2012-05-08 MED ORDER — HEPARIN SOD (PORK) LOCK FLUSH 100 UNIT/ML IV SOLN
INTRAVENOUS | Status: AC
  Filled 2012-05-08: qty 5

## 2012-05-08 MED ORDER — HEPARIN SOD (PORK) LOCK FLUSH 100 UNIT/ML IV SOLN
500.0000 [IU] | Freq: Once | INTRAVENOUS | Status: AC
  Administered 2012-05-08: 500 [IU] via INTRAVENOUS
  Filled 2012-05-08: qty 5

## 2012-05-08 MED ORDER — SODIUM CHLORIDE 0.9 % IJ SOLN
10.0000 mL | INTRAMUSCULAR | Status: DC | PRN
  Administered 2012-05-08: 10 mL via INTRAVENOUS
  Filled 2012-05-08: qty 10

## 2012-05-08 MED ORDER — SODIUM CHLORIDE 0.9 % IJ SOLN
INTRAMUSCULAR | Status: AC
  Filled 2012-05-08: qty 10

## 2012-05-08 NOTE — Progress Notes (Signed)
Roberta Gonzalez presented for Portacath access and flush. Proper placement of portacath confirmed by CXR. Portacath located rt chest wall accessed with  H 20 needle. Good blood return present. Portacath flushed with 20ml NS and 500U/5ml Heparin and needle removed intact. Procedure without incident. Patient tolerated procedure well.   

## 2012-05-09 ENCOUNTER — Other Ambulatory Visit (HOSPITAL_COMMUNITY): Payer: Self-pay | Admitting: Oncology

## 2012-05-28 ENCOUNTER — Other Ambulatory Visit (HOSPITAL_COMMUNITY): Payer: Self-pay | Admitting: Oncology

## 2012-06-19 ENCOUNTER — Encounter (HOSPITAL_COMMUNITY): Payer: Medicare Other | Attending: Hematology and Oncology

## 2012-06-19 DIAGNOSIS — C50319 Malignant neoplasm of lower-inner quadrant of unspecified female breast: Secondary | ICD-10-CM

## 2012-06-19 DIAGNOSIS — C50919 Malignant neoplasm of unspecified site of unspecified female breast: Secondary | ICD-10-CM

## 2012-06-19 DIAGNOSIS — Z452 Encounter for adjustment and management of vascular access device: Secondary | ICD-10-CM

## 2012-06-19 MED ORDER — HEPARIN SOD (PORK) LOCK FLUSH 100 UNIT/ML IV SOLN
500.0000 [IU] | Freq: Once | INTRAVENOUS | Status: AC
  Administered 2012-06-19: 500 [IU] via INTRAVENOUS
  Filled 2012-06-19: qty 5

## 2012-06-19 MED ORDER — HEPARIN SOD (PORK) LOCK FLUSH 100 UNIT/ML IV SOLN
INTRAVENOUS | Status: AC
  Filled 2012-06-19: qty 5

## 2012-06-19 MED ORDER — SODIUM CHLORIDE 0.9 % IJ SOLN
INTRAMUSCULAR | Status: AC
  Filled 2012-06-19: qty 10

## 2012-06-19 MED ORDER — SODIUM CHLORIDE 0.9 % IJ SOLN
10.0000 mL | INTRAMUSCULAR | Status: DC | PRN
  Administered 2012-06-19: 10 mL via INTRAVENOUS
  Filled 2012-06-19: qty 10

## 2012-06-19 NOTE — Progress Notes (Signed)
Tolerated port flush well. 

## 2012-07-03 ENCOUNTER — Encounter (HOSPITAL_COMMUNITY): Payer: Medicare Other | Attending: Hematology and Oncology | Admitting: Oncology

## 2012-07-03 VITALS — BP 104/59 | HR 80 | Temp 98.3°F | Resp 20 | Wt 123.9 lb

## 2012-07-03 DIAGNOSIS — G5790 Unspecified mononeuropathy of unspecified lower limb: Secondary | ICD-10-CM

## 2012-07-03 DIAGNOSIS — C50319 Malignant neoplasm of lower-inner quadrant of unspecified female breast: Secondary | ICD-10-CM

## 2012-07-03 DIAGNOSIS — Z171 Estrogen receptor negative status [ER-]: Secondary | ICD-10-CM

## 2012-07-03 DIAGNOSIS — C50919 Malignant neoplasm of unspecified site of unspecified female breast: Secondary | ICD-10-CM | POA: Insufficient documentation

## 2012-07-03 NOTE — Patient Instructions (Signed)
[facility]  Discharge Instructions Roberta Gonzalez  [id number] 03/22/1941 Dr. Glenford Peers    RECOMMENDATIONS MADE BY THE CONSULTANT AND ANY TEST RESULTS WILL BE SENT TO YOUR REFERRING DOCTOR.   EXAM FINDINGS BY MD TODAY AND SIGNS AND SYMPTOMS TO REPORT TO CLINIC OR PRIMARY MD:   From a cancer standpoint, you are doing good.   Return in 6 months  I acknowledge that I have been informed and understand all the instructions given to me and received a copy. I do not have any more questions at this time, but understand that I may call the Specialty Clinic at [facility] at [phone] during business hours should I have any further questions or need assistance in obtaining follow-up care.    __________________________________________  _____________  __________ Signature of Patient or Authorized Representative            Date                   Time    __________________________________________ Nurse's Signature

## 2012-07-03 NOTE — Progress Notes (Signed)
Roberta Stalling, MD [address] [place] [address]  1. Invasive ductal carcinoma of breast     CURRENT THERAPY: Observation  INTERVAL HISTORY: Roberta Gonzalez 71 y.o. 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 HER2 was positive with a ratio of 2.09. Primary was 2.1 cm. She had a 2nd lesion that was 0.4 cm. Underwent mastectomy 11/27/2010. She therefore had a T2 N1 MX cancer. She has had 6 cycles of carboplatin with Taxotere and 52 weeks of Herceptin.   From an oncological standpoint, Roberta Gonzalez is doing well.  However, she has other issues including neuropathic pain from her back radiating down her right thigh to her knee.  She was referred to neurologist/neurosurgweon by her PCP.  She also continues to have GERD that is better controlled with Dexilant, but not resolved.  We will defer to PCP.  She also has decreased hearing in the right ear which she believes is cerumen.  She is deaf in the left ear.  So from her breast cancer standpoint, she is in a remission.  She denies any new lumps or bumps.  She reports that her appetite is strong.  She does spend the majority of her time in a reclined position at home, but she is able to care for herself.  She spends about 3-4 hours out of her chair of bed during the daytime.  This gives her an ECOG of 3.  Otherwise, she is doing well.  Her other problems are non-oncologic and she is due to see her PCP soon.     Past Medical History  Diagnosis Date  . GERD (gastroesophageal reflux disease) 06/10/2011  . Chest pain 06/10/2011  . Cancer     jan 2012/mastectomy L; chemo/rad tx  . Invasive ductal carcinoma of breast 06/10/2011    has Invasive ductal carcinoma of breast; GERD (gastroesophageal reflux disease); and Chest pain on her problem  list.     is allergic to percodan and sulfa antibiotics.  Roberta Gonzalez had no medications administered during this visit.  No past surgical history on file.  Denies any headaches, dizziness, double vision, fevers, chills, night sweats, nausea, vomiting, diarrhea, constipation, chest pain, heart palpitations, shortness of breath, blood in stool, black tarry stool, urinary pain, urinary burning, urinary frequency, hematuria.   PHYSICAL EXAMINATION  ECOG PERFORMANCE STATUS: 3 - Symptomatic, >50% confined to bed  Filed Vitals:   07/03/12 1003  BP: 104/59  Pulse: 80  Temp: 98.3 F (36.8 C)  Resp: 20    GENERAL:alert, no distress, well nourished, well developed, comfortable, cooperative and smiling SKIN: skin color, texture, turgor are normal, no rashes or significant lesions HEAD: Normocephalic, No masses, lesions, tenderness or abnormalities EYES: normal, Conjunctiva are pink and non-injected EARS: External ears normal.  B/L Cerumen R >> L.  Unable to visualize right tympanic membrane due to cerumen obstruction.  OROPHARYNX:lips, buccal mucosa, and tongue normal and mucous membranes are moist  NECK: supple, trachea midline LYMPH:  no palpable lymphadenopathy, no hepatosplenomegaly BREAST:B/L post-mastectomy sites well healed and free of suspicious changes LUNGS: clear to auscultation and percussion HEART: regular rate & rhythm, no murmurs, no gallops, S1 normal and S2 normal ABDOMEN:abdomen soft, non-tender, normal bowel sounds, no masses or organomegaly and no hepatosplenomegaly BACK: Back symmetric, no curvature. EXTREMITIES:less then 2  second capillary refill, no joint deformities, effusion, or inflammation, no edema, no skin discoloration, no clubbing, no cyanosis  NEURO: alert & oriented x 3 with fluent speech, no focal motor/sensory deficits    LABORATORY DATA: CBC    Component Value Date/Time   WBC 5.0 10/22/2011 0933   RBC 3.30* 10/22/2011 0933   HGB 11.2* 10/22/2011  0933   HCT 33.6* 10/22/2011 0933   PLT 231 10/22/2011 0933   MCV 101.8* 10/22/2011 0933   MCH 33.9 10/22/2011 0933   MCHC 33.3 10/22/2011 0933   RDW 12.0 10/22/2011 0933   LYMPHSABS 0.9 10/22/2011 0933   MONOABS 0.7 10/22/2011 0933   EOSABS 0.5 10/22/2011 0933   BASOSABS 0.0 10/22/2011 0933      ASSESSMENT:  1. 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 HER2 was positive with a ratio of 2.09. Primary was 2.1 cm. She had a 2nd lesion that was 0.4 cm. Underwent mastectomy 11/27/2010. She therefore had a T2 N1 MX cancer. She has had 6 cycles of carboplatin with Taxotere and 52 weeks of Herceptin.  2. Gastroesophageal reflux disease.  3. Peripheral neuropathy of the legs more on the left than the right with weakness and pain as well on the left in particular times 4-5 years she states. She states that she saw a neurologist in [place] who could not find the reason for this. She has not seen anyone here in the form of a neurologist but is going to work on that.  4. Scoliosis according to recent CT scan 2012.  5. Hypercholesterolemia.  6. Borderline diabetes mellitus.  7. Codeine and sulfonamide allergies.  8. History of vertigo.  9. 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  10.History of right mastectomy in 2001.   PLAN:  1. Lab work in 6 months: CBC diff, CMET 2. Ear exam does reveal cerumen in right ear obstructing visualization of right ear.  Defer to PCP.  Recommend referral to ENT if warranted.  3. Patient was referred to neurologist in [place].  She has an upcoming appointment for that consultation.  4. Return in 6 months for follow-up.    All questions were answered. The patient knows to call the clinic with any problems, questions or concerns. We can certainly see the patient  much sooner if necessary.  The patient and plan discussed with Arlan Organ, MD and he is in agreement with the aforementioned.  Roberta Gonzalez

## 2012-07-31 ENCOUNTER — Encounter (HOSPITAL_COMMUNITY): Payer: Medicare Other | Attending: Hematology and Oncology

## 2012-07-31 DIAGNOSIS — Z452 Encounter for adjustment and management of vascular access device: Secondary | ICD-10-CM

## 2012-07-31 DIAGNOSIS — C50919 Malignant neoplasm of unspecified site of unspecified female breast: Secondary | ICD-10-CM | POA: Insufficient documentation

## 2012-07-31 MED ORDER — SODIUM CHLORIDE 0.9 % IJ SOLN
10.0000 mL | INTRAMUSCULAR | Status: DC | PRN
  Administered 2012-07-31: 10 mL via INTRAVENOUS
  Filled 2012-07-31: qty 10

## 2012-07-31 MED ORDER — SODIUM CHLORIDE 0.9 % IJ SOLN
INTRAMUSCULAR | Status: AC
  Filled 2012-07-31: qty 10

## 2012-07-31 MED ORDER — HEPARIN SOD (PORK) LOCK FLUSH 100 UNIT/ML IV SOLN
INTRAVENOUS | Status: AC
  Filled 2012-07-31: qty 5

## 2012-07-31 MED ORDER — HEPARIN SOD (PORK) LOCK FLUSH 100 UNIT/ML IV SOLN
500.0000 [IU] | Freq: Once | INTRAVENOUS | Status: AC
  Administered 2012-07-31: 500 [IU] via INTRAVENOUS
  Filled 2012-07-31: qty 5

## 2012-07-31 NOTE — Progress Notes (Signed)
Tolerated port flush well.  Good blood return. 

## 2012-09-11 ENCOUNTER — Encounter (HOSPITAL_COMMUNITY): Payer: Medicare Other | Attending: Hematology and Oncology

## 2012-09-11 DIAGNOSIS — Z452 Encounter for adjustment and management of vascular access device: Secondary | ICD-10-CM

## 2012-09-11 DIAGNOSIS — C50919 Malignant neoplasm of unspecified site of unspecified female breast: Secondary | ICD-10-CM | POA: Insufficient documentation

## 2012-09-11 DIAGNOSIS — C50319 Malignant neoplasm of lower-inner quadrant of unspecified female breast: Secondary | ICD-10-CM

## 2012-09-11 MED ORDER — SODIUM CHLORIDE 0.9 % IJ SOLN
10.0000 mL | INTRAMUSCULAR | Status: DC | PRN
  Administered 2012-09-11: 10 mL via INTRAVENOUS
  Filled 2012-09-11: qty 10

## 2012-09-11 MED ORDER — HEPARIN SOD (PORK) LOCK FLUSH 100 UNIT/ML IV SOLN
500.0000 [IU] | Freq: Once | INTRAVENOUS | Status: AC
  Administered 2012-09-11: 500 [IU] via INTRAVENOUS
  Filled 2012-09-11: qty 5

## 2012-09-11 MED ORDER — SODIUM CHLORIDE 0.9 % IJ SOLN
INTRAMUSCULAR | Status: AC
  Filled 2012-09-11: qty 10

## 2012-09-11 MED ORDER — HEPARIN SOD (PORK) LOCK FLUSH 100 UNIT/ML IV SOLN
INTRAVENOUS | Status: AC
  Filled 2012-09-11: qty 5

## 2012-09-11 NOTE — Progress Notes (Signed)
Tolerated port flush well. 

## 2012-09-28 ENCOUNTER — Other Ambulatory Visit (HOSPITAL_COMMUNITY): Payer: Self-pay | Admitting: Oncology

## 2012-11-06 ENCOUNTER — Encounter (HOSPITAL_COMMUNITY): Payer: Medicare Other

## 2012-11-13 ENCOUNTER — Encounter (HOSPITAL_COMMUNITY): Payer: Medicare Other

## 2012-11-27 ENCOUNTER — Encounter (HOSPITAL_COMMUNITY): Payer: Medicare Other

## 2012-11-29 ENCOUNTER — Encounter (HOSPITAL_COMMUNITY): Payer: Medicare Other | Attending: Hematology and Oncology

## 2012-11-29 ENCOUNTER — Encounter (HOSPITAL_COMMUNITY): Payer: Self-pay

## 2012-11-29 DIAGNOSIS — Z95828 Presence of other vascular implants and grafts: Secondary | ICD-10-CM

## 2012-11-29 DIAGNOSIS — Z452 Encounter for adjustment and management of vascular access device: Secondary | ICD-10-CM

## 2012-11-29 DIAGNOSIS — C50319 Malignant neoplasm of lower-inner quadrant of unspecified female breast: Secondary | ICD-10-CM

## 2012-11-29 DIAGNOSIS — Z9889 Other specified postprocedural states: Secondary | ICD-10-CM | POA: Insufficient documentation

## 2012-11-29 HISTORY — DX: Presence of other vascular implants and grafts: Z95.828

## 2012-11-29 MED ORDER — SODIUM CHLORIDE 0.9 % IJ SOLN
10.0000 mL | INTRAMUSCULAR | Status: DC | PRN
  Administered 2012-11-29: 10 mL via INTRAVENOUS
  Filled 2012-11-29: qty 10

## 2012-11-29 MED ORDER — HEPARIN SOD (PORK) LOCK FLUSH 100 UNIT/ML IV SOLN
500.0000 [IU] | Freq: Once | INTRAVENOUS | Status: AC
  Administered 2012-11-29: 500 [IU] via INTRAVENOUS
  Filled 2012-11-29: qty 5

## 2012-11-29 MED ORDER — DTAP-HEPATITIS B RECOMB-IPV IM SUSP: 0.5000 mL | Freq: Once | INTRAMUSCULAR | Status: DC

## 2012-11-29 MED ORDER — HEPARIN SOD (PORK) LOCK FLUSH 100 UNIT/ML IV SOLN
INTRAVENOUS | Status: AC
  Filled 2012-11-29: qty 5

## 2012-11-29 NOTE — Progress Notes (Signed)
Roberta Gonzalez presented for Portacath access and flush. Proper placement of portacath confirmed by CXR. Portacath located left chest wall accessed with  H 20 needle. Good blood return present. Portacath flushed with 20ml NS and 500U/34ml Heparin and needle removed intact. Procedure without incident. Patient tolerated procedure well.

## 2013-01-03 ENCOUNTER — Other Ambulatory Visit (HOSPITAL_COMMUNITY): Payer: Self-pay | Admitting: Oncology

## 2013-01-09 ENCOUNTER — Other Ambulatory Visit (HOSPITAL_COMMUNITY): Payer: Medicare Other

## 2013-01-09 ENCOUNTER — Encounter (HOSPITAL_COMMUNITY): Payer: Medicare Other

## 2013-01-09 ENCOUNTER — Ambulatory Visit (HOSPITAL_COMMUNITY): Payer: Medicare Other | Admitting: Oncology

## 2013-01-10 ENCOUNTER — Encounter (HOSPITAL_COMMUNITY): Payer: Medicare Other

## 2013-01-15 ENCOUNTER — Other Ambulatory Visit (HOSPITAL_COMMUNITY): Payer: Medicare Other

## 2013-01-15 ENCOUNTER — Encounter (HOSPITAL_COMMUNITY): Payer: Medicare Other | Attending: Hematology and Oncology | Admitting: Oncology

## 2013-01-15 VITALS — BP 123/72 | HR 82 | Temp 97.7°F | Resp 18 | Wt 130.4 lb

## 2013-01-15 DIAGNOSIS — Z853 Personal history of malignant neoplasm of breast: Secondary | ICD-10-CM

## 2013-01-15 DIAGNOSIS — Z9010 Acquired absence of unspecified breast and nipple: Secondary | ICD-10-CM

## 2013-01-15 DIAGNOSIS — I998 Other disorder of circulatory system: Secondary | ICD-10-CM | POA: Insufficient documentation

## 2013-01-15 DIAGNOSIS — I878 Other specified disorders of veins: Secondary | ICD-10-CM

## 2013-01-15 MED ORDER — SODIUM CHLORIDE 0.9 % IJ SOLN
10.0000 mL | INTRAMUSCULAR | Status: DC | PRN
  Administered 2013-01-15: 10 mL via INTRAVENOUS
  Filled 2013-01-15: qty 10

## 2013-01-15 MED ORDER — HEPARIN SOD (PORK) LOCK FLUSH 100 UNIT/ML IV SOLN
INTRAVENOUS | Status: AC
  Filled 2013-01-15: qty 5

## 2013-01-15 MED ORDER — HEPARIN SOD (PORK) LOCK FLUSH 100 UNIT/ML IV SOLN
500.0000 [IU] | Freq: Once | INTRAVENOUS | Status: AC
  Administered 2013-01-15: 500 [IU] via INTRAVENOUS
  Filled 2013-01-15: qty 5

## 2013-01-15 NOTE — Progress Notes (Signed)
Vali J Mccosh presented for Portacath access and flush. Proper placement of portacath confirmed by CXR. Portacath located right chest wall accessed with  H 20 needle. Good blood return present. Portacath flushed with 20ml NS and 500U/5ml Heparin and needle removed intact. Procedure without incident. Patient tolerated procedure well.   

## 2013-01-15 NOTE — Patient Instructions (Addendum)
[facility] Discharge Instructions  RECOMMENDATIONS MADE BY THE CONSULTANT AND ANY TEST RESULTS WILL BE SENT TO YOUR REFERRING PHYSICIAN.  EXAM FINDINGS BY THE PHYSICIAN TODAY AND SIGNS OR SYMPTOMS TO REPORT TO CLINIC OR PRIMARY PHYSICIAN: exam and discussion by MD.  Bonita Quin are doing well.  MEDICATIONS PRESCRIBED:  None  INSTRUCTIONS GIVEN AND DISCUSSED: Report any new lumps, bone pain or shortness of breath  SPECIAL INSTRUCTIONS/FOLLOW-UP: Port flushes every 6 weeks and to see PA in 6 months.  Thank you for choosing [facility] to provide your oncology and hematology care.  To afford each patient quality time with our providers, please arrive at least 15 minutes before your scheduled appointment time.  With your help, our goal is to use those 15 minutes to complete the necessary work-up to ensure our physicians have the information they need to help with your evaluation and healthcare recommendations.    Effective January 1st, 2014, we ask that you re-schedule your appointment with our physicians should you arrive 10 or more minutes late for your appointment.  We strive to give you quality time with our providers, and arriving late affects you and other patients whose appointments are after yours.    Again, thank you for choosing [facility].  Our hope is that these requests will decrease the amount of time that you wait before being seen by our physicians.       _____________________________________________________________  Should you have questions after your visit to [facility], please contact our office at [phone] between the hours of 8:30 a.m. and 5:00 p.m.  Voicemails left after 4:30 p.m. will not be returned until the following business day.  For prescription refill requests, have your pharmacy contact our office with your prescription refill request.

## 2013-01-15 NOTE — Progress Notes (Signed)
#  1 stage II, grade 3, ER negative, PR negative, HER-2/neu 3+ positive, breast cancer on the left with 2 of 8 positive lymph nodes, Ki-67 are her height 82%, LV I., primary was 2.1 cm. HER-2 was barely positive but still positive. The ratio is 2.09 appear she is status post mastectomy which time she was found to have 2 lesions, second lesion was 0.4 cm. Surgery took place on 11/27/2010. She had a T2, N1, M. asked, cancer. She is status post 6 cycles of carboplatinum, docetaxel, and Herceptin the latter for 52 weeks. Her last cycle of Herceptin was 12/03/2011. Her oncology review of systems today reveals no evidence recurrent disease. #2 bilateral hip replacements with chronic hip discomfort. Her daughter states she also has a history of spinal stenosis. #3 GERD #4 deconditioning with very difficult issues relating to walking, cycling, swimming, because of diagnoses #2 #5 chronic constipation  BP 123/72  Pulse 82  Temp(Src) 97.7 F (36.5 C) (Oral)  Resp 18  Wt 130 lb 5.8 oz (59.131 kg)  BMI 23.84 kg/m2  She remains no acute distress. She does have a grade 1/6 systolic ejection murmur. There is no S3 gallop or rub. Her lungs are clear. She has no lymphadenopathy in the cervical, supraclavicular, infraclavicular, axillary or inguinal areas. Her chest walls bilaterally are clear. She is status post bilateral mastectomies. Abdomen is soft and nontender without hepatosplenomegaly. Bowel sounds are diminished. She has no leg edema and no arm edema. Skin exam is unremarkable. She is alert and oriented.  We will see her back in 6 months. She had blood work just the other day at her primary care physician's office and we will get what we need but not duplicate any.

## 2013-02-02 ENCOUNTER — Emergency Department (HOSPITAL_COMMUNITY)
Admission: EM | Admit: 2013-02-02 | Discharge: 2013-02-03 | Disposition: A | Discharge status: Home / Self Care | Payer: Medicare Other | Attending: Emergency Medicine | Admitting: Emergency Medicine

## 2013-02-02 ENCOUNTER — Emergency Department (HOSPITAL_COMMUNITY): Payer: Medicare Other

## 2013-02-02 ENCOUNTER — Encounter (HOSPITAL_COMMUNITY): Payer: Self-pay | Admitting: Emergency Medicine

## 2013-02-02 DIAGNOSIS — W19XXXA Unspecified fall, initial encounter: Secondary | ICD-10-CM

## 2013-02-02 DIAGNOSIS — S8990XA Unspecified injury of unspecified lower leg, initial encounter: Secondary | ICD-10-CM | POA: Insufficient documentation

## 2013-02-02 DIAGNOSIS — W010XXA Fall on same level from slipping, tripping and stumbling without subsequent striking against object, initial encounter: Secondary | ICD-10-CM | POA: Insufficient documentation

## 2013-02-02 DIAGNOSIS — Z87891 Personal history of nicotine dependence: Secondary | ICD-10-CM | POA: Insufficient documentation

## 2013-02-02 DIAGNOSIS — M25572 Pain in left ankle and joints of left foot: Secondary | ICD-10-CM

## 2013-02-02 DIAGNOSIS — S99919A Unspecified injury of unspecified ankle, initial encounter: Secondary | ICD-10-CM | POA: Insufficient documentation

## 2013-02-02 DIAGNOSIS — Z923 Personal history of irradiation: Secondary | ICD-10-CM | POA: Insufficient documentation

## 2013-02-02 DIAGNOSIS — Y92009 Unspecified place in unspecified non-institutional (private) residence as the place of occurrence of the external cause: Secondary | ICD-10-CM | POA: Insufficient documentation

## 2013-02-02 DIAGNOSIS — K219 Gastro-esophageal reflux disease without esophagitis: Secondary | ICD-10-CM | POA: Insufficient documentation

## 2013-02-02 DIAGNOSIS — Y9389 Activity, other specified: Secondary | ICD-10-CM | POA: Insufficient documentation

## 2013-02-02 DIAGNOSIS — Z853 Personal history of malignant neoplasm of breast: Secondary | ICD-10-CM | POA: Insufficient documentation

## 2013-02-02 DIAGNOSIS — Z8679 Personal history of other diseases of the circulatory system: Secondary | ICD-10-CM | POA: Insufficient documentation

## 2013-02-02 DIAGNOSIS — Z79899 Other long term (current) drug therapy: Secondary | ICD-10-CM | POA: Insufficient documentation

## 2013-02-02 DIAGNOSIS — Z9221 Personal history of antineoplastic chemotherapy: Secondary | ICD-10-CM | POA: Insufficient documentation

## 2013-02-02 DIAGNOSIS — W1809XA Striking against other object with subsequent fall, initial encounter: Secondary | ICD-10-CM | POA: Insufficient documentation

## 2013-02-02 MED ORDER — ACETAMINOPHEN 500 MG PO TABS
1000.0000 mg | ORAL_TABLET | Freq: Once | ORAL | Status: AC
  Administered 2013-02-02: 1000 mg via ORAL
  Filled 2013-02-02: qty 2

## 2013-02-02 NOTE — ED Notes (Signed)
Pt c/o left foot pain after tripping on concrete walkway tonight at 2030

## 2013-02-02 NOTE — ED Provider Notes (Signed)
History     CSN: [id number]  Arrival date & time 02/02/13  2241   First MD Initiated Contact with Patient 02/02/13 2257      Chief Complaint  Patient presents with  . Ankle Pain  . Fall    (Consider location/radiation/quality/duration/timing/severity/associated sxs/prior treatment) HPI Roberta Gonzalez is a 72 y.o. female who presents to the Emergency Department complaining of left ankle pain after falling over a rink while approaching her home.  Patient is unabl to move the foot without pain.  PCP Dr. Janna Arch Past Medical History  Diagnosis Date  . GERD (gastroesophageal reflux disease) 06/10/2011  . Chest pain 06/10/2011  . Cancer     jan 2012/mastectomy L; chemo/rad tx  . 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 Port catheter in place 11/29/2012    Past Surgical History  Procedure Laterality Date  . Mastectomy      History reviewed. No pertinent family history.  History  Substance Use Topics  . Smoking status: Former Games developer  . Smokeless tobacco: Not on file  . Alcohol Use: Not on file    OB History   Grav Para Term Preterm Abortions TAB SAB Ect Mult Living                  Review of Systems  Constitutional: Negative for fever.       10 Systems reviewed and are negative for acute change except as noted in the HPI.  HENT: Negative for congestion.   Eyes: Negative for discharge and redness.  Respiratory: Negative for cough and shortness of breath.   Cardiovascular: Negative for chest pain.  Gastrointestinal: Negative for vomiting and abdominal pain.  Musculoskeletal: Negative for back pain.       Left ankle pain  Skin: Negative for rash.  Neurological: Negative for syncope, numbness and headaches.  Psychiatric/Behavioral:       No behavior change.    Allergies  Percodan and Sulfa antibiotics  Home Medications   Current Outpatient Rx  Name  Route  Sig  Dispense  Refill  . cetirizine (ZYRTEC) 10 MG tablet   Oral   Take 10 mg by mouth daily.            Marland Kitchen Dexlansoprazole 30 MG capsule   Oral   Take 30 mg by mouth daily.         Marland Kitchen escitalopram (LEXAPRO) 20 MG tablet      TAKE ONE TABLET BY MOUTH EVERY DAY   30 tablet   3   . fish oil-omega-3 fatty acids 1000 MG capsule   Oral   Take 1 g by mouth daily.         Marland Kitchen HYDROcodone-acetaminophen (NORCO/VICODIN) 5-325 MG per tablet   Oral   Take 1 tablet by mouth every 6 (six) hours as needed.         Marland Kitchen ibuprofen (ADVIL,MOTRIN) 200 MG tablet   Oral   Take 200 mg by mouth every 6 (six) hours as needed.           . naproxen (NAPROSYN) 500 MG tablet   Oral   Take 500 mg by mouth as needed. For pain         . pravastatin (PRAVACHOL) 40 MG tablet   Oral   Take 40 mg by mouth daily.           . Prenatal Vit-Fe Psac Cmplx-FA (PRENATAL MULTIVITAMIN) 60-1 MG tablet   Oral   Take 1 tablet by mouth daily  with breakfast.           . Risedronate Sodium 35 MG TBEC   Oral   Take 1 tablet by mouth once a week.             BP 121/53  Pulse 80  Temp(Src) 98.2 F (36.8 C) (Oral)  Resp 17  Ht 5\' 4"  (1.626 m)  Wt 129 lb (58.514 kg)  BMI 22.13 kg/m2  SpO2 98%  Physical Exam  Nursing note and vitals reviewed. Constitutional: She appears well-developed and well-nourished.  Awake, alert, nontoxic appearance.  HENT:  Head: Normocephalic and atraumatic.  Eyes: EOM are normal. Pupils are equal, round, and reactive to light. Right eye exhibits no discharge. Left eye exhibits no discharge.  Neck: Neck supple.  Cardiovascular: Normal rate.   Pulmonary/Chest: Effort normal and breath sounds normal. She exhibits no tenderness.  Abdominal: Soft. There is no tenderness. There is no rebound.  Musculoskeletal: She exhibits no tenderness.  Baseline ROM, no obvious new focal weakness.  Neurological:  Mental status and motor strength appears baseline for patient and situation.  Skin: No rash noted.  Left ankle with pain with palpation. No deformity, swelling, erythema,  lesions  Psychiatric: She has a normal mood and affect.    ED Course  Procedures (including critical care time)  Dg Ankle Complete Left  02/02/2013  *RADIOLOGY REPORT*  Clinical Data: Ankle pain.  Fall.  LEFT ANKLE COMPLETE - 3+ VIEW  Comparison: None.  Findings: There is no evidence for acute fracture or dislocation. No soft tissue foreign body or gas identified.  IMPRESSION: Negative exam.   Original Report Authenticated By: Norva Pavlov, M.D.      MDM  Patient with fall over a brink as she approached her home. Xray negative for acute injury. Will ACE the ankle. Pt stable in ED with no significant deterioration in condition.The patient appears reasonably screened and/or stabilized for discharge and I doubt any other medical condition or other [facility] requiring further screening, evaluation, or treatment in the ED at this time prior to discharge.  MDM Reviewed: nursing note and vitals Interpretation: x-ray           Nicoletta Dress. Colon Branch, MD 02/02/13 [phone]

## 2013-02-13 ENCOUNTER — Emergency Department (HOSPITAL_COMMUNITY)
Admission: EM | Admit: 2013-02-13 | Discharge: 2013-02-13 | Disposition: A | Discharge status: Home / Self Care | Payer: Medicare Other | Attending: Emergency Medicine | Admitting: Emergency Medicine

## 2013-02-13 ENCOUNTER — Encounter (HOSPITAL_COMMUNITY): Payer: Self-pay | Admitting: *Deleted

## 2013-02-13 ENCOUNTER — Emergency Department (HOSPITAL_COMMUNITY): Payer: Medicare Other

## 2013-02-13 DIAGNOSIS — Z79899 Other long term (current) drug therapy: Secondary | ICD-10-CM | POA: Insufficient documentation

## 2013-02-13 DIAGNOSIS — Z9889 Other specified postprocedural states: Secondary | ICD-10-CM | POA: Insufficient documentation

## 2013-02-13 DIAGNOSIS — Z8679 Personal history of other diseases of the circulatory system: Secondary | ICD-10-CM | POA: Insufficient documentation

## 2013-02-13 DIAGNOSIS — S92309A Fracture of unspecified metatarsal bone(s), unspecified foot, initial encounter for closed fracture: Secondary | ICD-10-CM | POA: Insufficient documentation

## 2013-02-13 DIAGNOSIS — S92302A Fracture of unspecified metatarsal bone(s), left foot, initial encounter for closed fracture: Secondary | ICD-10-CM

## 2013-02-13 DIAGNOSIS — Y9289 Other specified places as the place of occurrence of the external cause: Secondary | ICD-10-CM | POA: Insufficient documentation

## 2013-02-13 DIAGNOSIS — Z87891 Personal history of nicotine dependence: Secondary | ICD-10-CM | POA: Insufficient documentation

## 2013-02-13 DIAGNOSIS — Y9301 Activity, walking, marching and hiking: Secondary | ICD-10-CM | POA: Insufficient documentation

## 2013-02-13 DIAGNOSIS — W010XXA Fall on same level from slipping, tripping and stumbling without subsequent striking against object, initial encounter: Secondary | ICD-10-CM | POA: Insufficient documentation

## 2013-02-13 DIAGNOSIS — K219 Gastro-esophageal reflux disease without esophagitis: Secondary | ICD-10-CM | POA: Insufficient documentation

## 2013-02-13 DIAGNOSIS — Z853 Personal history of malignant neoplasm of breast: Secondary | ICD-10-CM | POA: Insufficient documentation

## 2013-02-13 MED ORDER — HYDROCODONE-ACETAMINOPHEN 5-325 MG PO TABS
1.0000 | ORAL_TABLET | ORAL | Status: DC | PRN
Start: 2013-02-13 — End: 2013-03-19

## 2013-02-13 NOTE — ED Provider Notes (Signed)
History     CSN: [id number]  Arrival date & time 02/13/13  1459   First MD Initiated Contact with Patient 02/13/13 1608      Chief Complaint  Patient presents with  . Foot Pain    (Consider location/radiation/quality/duration/timing/severity/associated sxs/prior treatment) HPI Comments: Roberta Gonzalez is a 72 y.o. Female presenting with persistent pain in her left ankle and foot after tripping and falling on 02/02/13.  She was seen here the same day at which time her xrays were negative.  She reports she is still unable to bear weight except for slow trips from the bathroom to her bedroom with the use of a cane or walker and continues to have pain, swelling and persistent bruising.  She believes she may have hyper extended her great toe during the fall.  She has found no alleviators for the pain except for rest.  She is using tylenol and motrin,  Heat and elevation.     The history is provided by the patient and a relative.    Past Medical History  Diagnosis Date  . GERD (gastroesophageal reflux disease) 06/10/2011  . Chest pain 06/10/2011  . Port catheter in place 11/29/2012  . Cancer     jan 2012/mastectomy L; chemo/rad tx  . 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 Mastectomy    . Joint replacement    . Hip replacements    . Cranial shunt    . Abdominal hysterectomy    . Tonsillectomy      History reviewed. No pertinent family history.  History  Substance Use Topics  . Smoking status: Former Games developer  . Smokeless tobacco: Not on file  . Alcohol Use: Yes    OB History   Grav Para Term Preterm Abortions TAB SAB Ect Mult Living                  Review of Systems  Constitutional: Negative for fever.  Musculoskeletal: Positive for joint swelling and arthralgias. Negative for myalgias.  Neurological: Negative for weakness and numbness.    Allergies  Percodan and Sulfa antibiotics  Home Medications    Current Outpatient Rx  Name  Route  Sig  Dispense  Refill  . Dexlansoprazole 30 MG capsule   Oral   Take 30 mg by mouth every morning.          . escitalopram (LEXAPRO) 20 MG tablet   Oral   Take 20 mg by mouth every morning.         . fish oil-omega-3 fatty acids 1000 MG capsule   Oral   Take 1 g by mouth every morning.          Marland Kitchen HYDROcodone-acetaminophen (NORCO/VICODIN) 5-325 MG per tablet   Oral   Take 1 tablet by mouth every 6 (six) hours as needed for pain.          Marland Kitchen ibuprofen (ADVIL,MOTRIN) 200 MG tablet   Oral   Take 200 mg by mouth every 6 (six) hours as needed for pain.          Marland Kitchen loratadine (CLARITIN) 10 MG tablet   Oral   Take 10 mg by mouth at bedtime.         . naproxen sodium (ANAPROX) 220 MG tablet   Oral   Take 440 mg by mouth daily as needed (for pain).         . pravastatin (PRAVACHOL) 40 MG tablet  Oral   Take 40 mg by mouth at bedtime.          . Prenatal Vit-Fe Psac Cmplx-FA (PRENATAL MULTIVITAMIN) 60-1 MG tablet   Oral   Take 1 tablet by mouth daily with breakfast.          . Risedronate Sodium 35 MG TBEC   Oral   Take 1 tablet by mouth once a week. *Patient takes this medication on Tuesdays*         . HYDROcodone-acetaminophen (NORCO/VICODIN) 5-325 MG per tablet   Oral   Take 1 tablet by mouth every 4 (four) hours as needed for pain.   15 tablet   0     BP 103/62  Pulse 100  Temp(Src) 98.7 F (37.1 C) (Oral)  Resp 18  Ht 5\' 3"  (1.6 m)  Wt 128 lb (58.06 kg)  BMI 22.68 kg/m2  SpO2 97%  Physical Exam  Constitutional: She appears well-developed and well-nourished.  HENT:  Head: Atraumatic.  Neck: Normal range of motion.  Cardiovascular:  Pulses equal bilaterally  Musculoskeletal: She exhibits edema and tenderness.       Left foot: She exhibits decreased range of motion, bony tenderness and swelling. She exhibits no crepitus and no deformity.  Generalized edema of left dorsal and lateral foot.  Old  ecchymosis noted along lateral foot, also large ecchymosis medial plantar foot at foot arch.  Dorsalis pedis pulse intact.  Neurological: She is alert. She has normal strength. She displays normal reflexes. No sensory deficit.  Equal strength  Skin: Skin is warm and dry.  Psychiatric: She has a normal mood and affect.    ED Course  Procedures (including critical care time)  Labs Reviewed - No data to display Dg Foot Complete Left  02/13/2013  *RADIOLOGY REPORT*  Clinical Data: Foot pain, injury 02/02/2013  LEFT FOOT - COMPLETE 3+ VIEW  Comparison: 02/02/2013  Findings: Bones appear osteopenic.  Acute fractures noted distally of the left second and third metatarsals.  Degenerative changes noted of the left first MTP joint.  Mild soft tissue swelling. Other metatarsals appear intact.  IMPRESSION: Acute fractures left second and third metatarsals distally.   Original Report Authenticated By: Judie Petit. Miles Costain, M.D.      1. Metatarsal fracture, left, closed, initial encounter       MDM  I spoke with Dr. Hilda Lias who advised Cam Walker to extremity and will see patient in his office tomorrow for recheck.  She was prescribed hydrocodone and encouraged to continue to minimize weightbearing using her walker or pain.  further management per Dr. Hilda Lias.   Patients labs and/or radiological studies were viewed and considered during the medical decision making and disposition process.     Burgess Amor, PA-C 02/13/13 1747

## 2013-02-13 NOTE — ED Provider Notes (Signed)
Patient reports she was walking outside in the dark and she tripped and heard a pop in her foot. This initially happened on March 14. She relates she continues to have swelling and bruising and pain in her foot when she walks. She has been elevating her foot without improvement.  Patient's noted to have some swelling and bruising in her right foot.   Medical screening examination/treatment/procedure(s) were conducted as a shared visit with non-physician practitioner(s) and myself.  I personally evaluated the patient during the encounter  Devoria Albe, MD, Franz Dell, MD 02/14/13 [phone]

## 2013-02-13 NOTE — ED Notes (Signed)
Pain rt foot  Foot, has wrapped in an ace,  Seen here 3/14  And had x ray

## 2013-02-13 NOTE — ED Notes (Signed)
Pt is concerned that her foot is still swollen after a week. States she keeps it elevated as much as possible and alternated ice and heat. Left foot has old bruising and is slightly swollen.

## 2013-02-27 ENCOUNTER — Encounter (HOSPITAL_COMMUNITY): Payer: Medicare Other | Attending: Hematology and Oncology

## 2013-02-27 DIAGNOSIS — C50319 Malignant neoplasm of lower-inner quadrant of unspecified female breast: Secondary | ICD-10-CM

## 2013-02-27 DIAGNOSIS — Z9889 Other specified postprocedural states: Secondary | ICD-10-CM | POA: Insufficient documentation

## 2013-02-27 DIAGNOSIS — Z452 Encounter for adjustment and management of vascular access device: Secondary | ICD-10-CM

## 2013-02-27 DIAGNOSIS — Z95828 Presence of other vascular implants and grafts: Secondary | ICD-10-CM

## 2013-02-27 MED ORDER — SODIUM CHLORIDE 0.9 % IJ SOLN
10.0000 mL | INTRAMUSCULAR | Status: DC | PRN
  Administered 2013-02-27: 10 mL via INTRAVENOUS
  Filled 2013-02-27: qty 10

## 2013-02-27 MED ORDER — HEPARIN SOD (PORK) LOCK FLUSH 100 UNIT/ML IV SOLN
500.0000 [IU] | Freq: Once | INTRAVENOUS | Status: AC
  Administered 2013-02-27: 500 [IU] via INTRAVENOUS
  Filled 2013-02-27: qty 5

## 2013-02-27 MED ORDER — HEPARIN SOD (PORK) LOCK FLUSH 100 UNIT/ML IV SOLN
INTRAVENOUS | Status: AC
  Filled 2013-02-27: qty 5

## 2013-02-27 NOTE — Progress Notes (Signed)
Roberta Gonzalez presented for Portacath access and flush. Proper placement of portacath confirmed by CXR. Portacath located  chest wall accessed with  H 20 needle. Good blood return present. Portacath flushed with 20ml NS and 500U/68ml Heparin and needle removed intact. Procedure without incident. Patient tolerated procedure well.

## 2013-03-09 ENCOUNTER — Other Ambulatory Visit (HOSPITAL_COMMUNITY): Payer: Self-pay | Admitting: Oncology

## 2013-03-19 ENCOUNTER — Encounter (HOSPITAL_COMMUNITY): Payer: Self-pay | Admitting: Pharmacy Technician

## 2013-03-23 NOTE — Patient Instructions (Addendum)
Your procedure is scheduled on: 04/02/2013  Report to [facility] at  0800     AM.  Call this number if you have problems the morning of surgery: [phone]   Do not eat food or drink liquids :After Midnight.      Take these medicines the morning of surgery with A SIP OF WATER: dexlansoprazole, lexapro,norco,claritin,carafate   Do not wear jewelry, make-up or nail polish.  Do not wear lotions, powders, or perfumes.   Do not shave 48 hours prior to surgery.  Do not bring valuables to the hospital.  Contacts, dentures or bridgework may not be worn into surgery.  Leave suitcase in the car. After surgery it may be brought to your room.  For patients admitted to the hospital, checkout time is 11:00 AM the day of discharge.   Patients discharged the day of surgery will not be allowed to drive home.  :     Please read over the following fact sheets that you were given: Coughing and Deep Breathing, Surgical Site Infection Prevention, Anesthesia Post-op Instructions and Care and Recovery After Surgery    Cataract A cataract is a clouding of the lens of the eye. When a lens becomes cloudy, vision is reduced based on the degree and nature of the clouding. Many cataracts reduce vision to some degree. Some cataracts make people more near-sighted as they develop. Other cataracts increase glare. Cataracts that are ignored and become worse can sometimes look white. The white color can be seen through the pupil. CAUSES   Aging. However, cataracts may occur at any age, even in newborns.   Certain drugs.   Trauma to the eye.   Certain diseases such as diabetes.   Specific eye diseases such as chronic inflammation inside the eye or a sudden attack of a rare form of glaucoma.   Inherited or acquired medical problems.  SYMPTOMS   Gradual, progressive drop in vision in the affected eye.   Severe, rapid visual loss. This most often happens when trauma is the cause.  DIAGNOSIS  To detect a cataract,  an eye doctor examines the lens. Cataracts are best diagnosed with an exam of the eyes with the pupils enlarged (dilated) by drops.  TREATMENT  For an early cataract, vision may improve by using different eyeglasses or stronger lighting. If that does not help your vision, surgery is the only effective treatment. A cataract needs to be surgically removed when vision loss interferes with your everyday activities, such as driving, reading, or watching TV. A cataract may also have to be removed if it prevents examination or treatment of another eye problem. Surgery removes the cloudy lens and usually replaces it with a substitute lens (intraocular lens, IOL).  At a time when both you and your doctor agree, the cataract will be surgically removed. If you have cataracts in both eyes, only one is usually removed at a time. This allows the operated eye to heal and be out of danger from any possible problems after surgery (such as infection or poor wound healing). In rare cases, a cataract may be doing damage to your eye. In these cases, your caregiver may advise surgical removal right away. The vast majority of people who have cataract surgery have better vision afterward. HOME CARE INSTRUCTIONS  If you are not planning surgery, you may be asked to do the following:  Use different eyeglasses.   Use stronger or brighter lighting.   Ask your eye doctor about reducing your medicine dose  or changing medicines if it is thought that a medicine caused your cataract. Changing medicines does not make the cataract go away on its own.   Become familiar with your surroundings. Poor vision can lead to injury. Avoid bumping into things on the affected side. You are at a higher risk for tripping or falling.   Exercise extreme care when driving or operating machinery.   Wear sunglasses if you are sensitive to bright light or experiencing problems with glare.  SEEK IMMEDIATE MEDICAL CARE IF:   You have a worsening or  sudden vision loss.   You notice redness, swelling, or increasing pain in the eye.   You have a fever.  Document Released: 11/08/2005 Document Revised: 10/28/2011 Document Reviewed: 07/02/2011 [facility] Patient Information 2012 [place].PATIENT INSTRUCTIONS POST-ANESTHESIA  IMMEDIATELY FOLLOWING SURGERY:  Do not drive or operate machinery for the first twenty four hours after surgery.  Do not make any important decisions for twenty four hours after surgery or while taking narcotic pain medications or sedatives.  If you develop intractable nausea and vomiting or a severe headache please notify your doctor immediately.  FOLLOW-UP:  Please make an appointment with your surgeon as instructed. You do not need to follow up with anesthesia unless specifically instructed to do so.  WOUND CARE INSTRUCTIONS (if applicable):  Keep a dry clean dressing on the anesthesia/puncture wound site if there is drainage.  Once the wound has quit draining you may leave it open to air.  Generally you should leave the bandage intact for twenty four hours unless there is drainage.  If the epidural site drains for more than 36-48 hours please call the anesthesia department.  QUESTIONS?:  Please feel free to call your physician or the hospital operator if you have any questions, and they will be happy to assist you.

## 2013-03-26 ENCOUNTER — Encounter (HOSPITAL_COMMUNITY): Payer: Self-pay

## 2013-03-26 ENCOUNTER — Encounter (HOSPITAL_COMMUNITY)
Admission: RE | Admit: 2013-03-26 | Discharge: 2013-03-26 | Disposition: A | Discharge status: Home / Self Care | Payer: Medicare Other | Source: Ambulatory Visit | Attending: Ophthalmology | Admitting: Ophthalmology

## 2013-03-26 HISTORY — DX: Depression, unspecified: F32.A

## 2013-03-26 HISTORY — DX: Major depressive disorder, single episode, unspecified: F32.9

## 2013-03-26 HISTORY — DX: Pure hypercholesterolemia, unspecified: E78.00

## 2013-03-26 HISTORY — DX: Polyneuropathy, unspecified: G62.9

## 2013-03-26 LAB — BASIC METABOLIC PANEL
BUN: 13 mg/dL (ref 6–23)
CO2: 29 mEq/L (ref 19–32)
Calcium: 9.7 mg/dL (ref 8.4–10.5)
Chloride: 103 mEq/L (ref 96–112)
Creatinine, Ser: 0.56 mg/dL (ref 0.50–1.10)
GFR calc Af Amer: >90 mL/min (ref >90)
GFR calc non Af Amer: >90 mL/min (ref >90)
Glucose, Bld: 103 mg/dL — ABNORMAL HIGH (ref 70–99)
Potassium: 3.9 mEq/L (ref 3.5–5.1)
Sodium: 140 mEq/L (ref 135–145)

## 2013-03-26 LAB — HEMOGLOBIN AND HEMATOCRIT, BLOOD
HCT: 37 % (ref 36.0–46.0)
Hemoglobin: 12.3 g/dL (ref 12.0–15.0)

## 2013-03-30 MED ORDER — TETRACAINE HCL 0.5 % OP SOLN
OPHTHALMIC | Status: AC
  Filled 2013-03-30: qty 2

## 2013-03-30 MED ORDER — LIDOCAINE HCL 3.5 % OP GEL
OPHTHALMIC | Status: AC
  Filled 2013-03-30: qty 5

## 2013-03-30 MED ORDER — CYCLOPENTOLATE-PHENYLEPHRINE 0.2-1 % OP SOLN
OPHTHALMIC | Status: AC
  Filled 2013-03-30: qty 2

## 2013-03-30 MED ORDER — PHENYLEPHRINE HCL 2.5 % OP SOLN
OPHTHALMIC | Status: AC
  Filled 2013-03-30: qty 2

## 2013-03-30 MED ORDER — LIDOCAINE HCL (PF) 1 % IJ SOLN
INTRAMUSCULAR | Status: AC
  Filled 2013-03-30: qty 2

## 2013-04-02 ENCOUNTER — Encounter (HOSPITAL_COMMUNITY): Payer: Self-pay | Admitting: *Deleted

## 2013-04-02 ENCOUNTER — Encounter (HOSPITAL_COMMUNITY): Payer: Self-pay | Admitting: Anesthesiology

## 2013-04-02 ENCOUNTER — Ambulatory Visit (HOSPITAL_COMMUNITY): Payer: Medicare Other | Admitting: Anesthesiology

## 2013-04-02 ENCOUNTER — Encounter (HOSPITAL_COMMUNITY)
Admission: RE | Disposition: A | Discharge status: Home / Self Care | Payer: Self-pay | Source: Ambulatory Visit | Attending: Ophthalmology

## 2013-04-02 ENCOUNTER — Ambulatory Visit (HOSPITAL_COMMUNITY)
Admission: RE | Admit: 2013-04-02 | Discharge: 2013-04-02 | Disposition: A | Discharge status: Home / Self Care | Payer: Medicare Other | Source: Ambulatory Visit | Attending: Ophthalmology | Admitting: Ophthalmology

## 2013-04-02 DIAGNOSIS — K219 Gastro-esophageal reflux disease without esophagitis: Secondary | ICD-10-CM | POA: Insufficient documentation

## 2013-04-02 DIAGNOSIS — Z853 Personal history of malignant neoplasm of breast: Secondary | ICD-10-CM | POA: Insufficient documentation

## 2013-04-02 DIAGNOSIS — Z885 Allergy status to narcotic agent status: Secondary | ICD-10-CM | POA: Insufficient documentation

## 2013-04-02 DIAGNOSIS — M129 Arthropathy, unspecified: Secondary | ICD-10-CM | POA: Insufficient documentation

## 2013-04-02 DIAGNOSIS — F329 Major depressive disorder, single episode, unspecified: Secondary | ICD-10-CM | POA: Insufficient documentation

## 2013-04-02 DIAGNOSIS — H2510 Age-related nuclear cataract, unspecified eye: Secondary | ICD-10-CM | POA: Insufficient documentation

## 2013-04-02 DIAGNOSIS — F3289 Other specified depressive episodes: Secondary | ICD-10-CM | POA: Insufficient documentation

## 2013-04-02 DIAGNOSIS — Z882 Allergy status to sulfonamides status: Secondary | ICD-10-CM | POA: Insufficient documentation

## 2013-04-02 DIAGNOSIS — Z79899 Other long term (current) drug therapy: Secondary | ICD-10-CM | POA: Insufficient documentation

## 2013-04-02 HISTORY — PX: CATARACT EXTRACTION W/PHACO: SHX586

## 2013-04-02 SURGERY — PHACOEMULSIFICATION, CATARACT, WITH IOL INSERTION
Anesthesia: Monitor Anesthesia Care | Site: Eye | Laterality: Right | Wound class: Clean

## 2013-04-02 MED ORDER — ONDANSETRON HCL 4 MG/2ML IJ SOLN: 4.0000 mg | Freq: Once | INTRAMUSCULAR | Status: DC | PRN

## 2013-04-02 MED ORDER — MIDAZOLAM HCL 2 MG/2ML IJ SOLN
INTRAMUSCULAR | Status: AC
  Filled 2013-04-02: qty 2

## 2013-04-02 MED ORDER — LIDOCAINE HCL (PF) 1 % IJ SOLN
INTRAMUSCULAR | Status: DC | PRN
  Administered 2013-04-02: .4 mL

## 2013-04-02 MED ORDER — CYCLOPENTOLATE-PHENYLEPHRINE 0.2-1 % OP SOLN
1.0000 [drp] | OPHTHALMIC | Status: AC
  Administered 2013-04-02 (×3): 1 [drp] via OPHTHALMIC

## 2013-04-02 MED ORDER — PHENYLEPHRINE HCL 2.5 % OP SOLN
1.0000 [drp] | OPHTHALMIC | Status: AC
  Administered 2013-04-02 (×3): 1 [drp] via OPHTHALMIC

## 2013-04-02 MED ORDER — LACTATED RINGERS IV SOLN
INTRAVENOUS | Status: DC
  Administered 2013-04-02: 1000 mL via INTRAVENOUS

## 2013-04-02 MED ORDER — TETRACAINE HCL 0.5 % OP SOLN
1.0000 [drp] | OPHTHALMIC | Status: AC
  Administered 2013-04-02 (×3): 1 [drp] via OPHTHALMIC

## 2013-04-02 MED ORDER — BSS IO SOLN
INTRAOCULAR | Status: DC | PRN
  Administered 2013-04-02: 15 mL via INTRAOCULAR

## 2013-04-02 MED ORDER — MIDAZOLAM HCL 2 MG/2ML IJ SOLN
1.0000 mg | INTRAMUSCULAR | Status: DC | PRN
  Administered 2013-04-02: 2 mg via INTRAVENOUS

## 2013-04-02 MED ORDER — NEOMYCIN-POLYMYXIN-DEXAMETH 0.1 % OP OINT
TOPICAL_OINTMENT | OPHTHALMIC | Status: DC | PRN
  Administered 2013-04-02: 1 via OPHTHALMIC

## 2013-04-02 MED ORDER — LIDOCAINE HCL 3.5 % OP GEL
1.0000 "application " | Freq: Once | OPHTHALMIC | Status: AC
  Administered 2013-04-02: 1 via OPHTHALMIC

## 2013-04-02 MED ORDER — EPINEPHRINE HCL 1 MG/ML IJ SOLN
INTRAMUSCULAR | Status: AC
  Filled 2013-04-02: qty 1

## 2013-04-02 MED ORDER — SODIUM HYALURONATE 10 MG/ML IO SOLN
INTRAOCULAR | Status: DC | PRN
  Administered 2013-04-02: 0.85 mL via INTRAOCULAR

## 2013-04-02 MED ORDER — POVIDONE-IODINE 5 % OP SOLN
OPHTHALMIC | Status: DC | PRN
  Administered 2013-04-02: 1 via OPHTHALMIC

## 2013-04-02 MED ORDER — FENTANYL CITRATE 0.05 MG/ML IJ SOLN: 25.0000 ug | INTRAMUSCULAR | Status: DC | PRN

## 2013-04-02 MED ORDER — EPINEPHRINE HCL 1 MG/ML IJ SOLN
INTRAOCULAR | Status: DC | PRN
  Administered 2013-04-02: 10:00:00

## 2013-04-02 SURGICAL SUPPLY — 32 items

## 2013-04-02 NOTE — Anesthesia Preprocedure Evaluation (Signed)
Anesthesia Evaluation  Patient identified by MRN, date of birth, ID band Patient awake    Reviewed: Allergy & Precautions, H&P , NPO status , Patient's Chart, lab work & pertinent test results  Airway Mallampati: I TM Distance: >3 FB Neck ROM: Full    Dental  (+) Edentulous Upper and Edentulous Lower   Pulmonary neg pulmonary ROS,  breath sounds clear to auscultation        Cardiovascular Rhythm:Regular Rate:Normal     Neuro/Psych  Headaches, PSYCHIATRIC DISORDERS Depression    GI/Hepatic GERD-  ,  Endo/Other    Renal/GU      Musculoskeletal   Abdominal   Peds  Hematology   Anesthesia Other Findings Bilat breast CA  Reproductive/Obstetrics                           Anesthesia Physical Anesthesia Plan  ASA: III  Anesthesia Plan: MAC   Post-op Pain Management:    Induction: Intravenous  Airway Management Planned: Nasal Cannula  Additional Equipment:   Intra-op Plan:   Post-operative Plan:   Informed Consent: I have reviewed the patients History and Physical, chart, labs and discussed the procedure including the risks, benefits and alternatives for the proposed anesthesia with the patient or authorized representative who has indicated his/her understanding and acceptance.     Plan Discussed with:   Anesthesia Plan Comments:         Anesthesia Quick Evaluation

## 2013-04-02 NOTE — H&P (Signed)
I have reviewed the H&P, the patient was re-examined, and I have identified no interval changes in medical condition and plan of care since the history and physical of record  

## 2013-04-02 NOTE — Op Note (Signed)
Date of Admission: 04/02/13  Date of Surgery: 04/02/13  Pre-Op Dx: Cataract  Right  Eye  Post-Op Dx: Nuclear Cataract  Right Eye, Dx Code 366.16  Surgeon: Gemma Payor, M.D.  Assistants: None  Anesthesia: Topical with MAC  Indications: Painless, progressive loss of vision with compromise of daily activities.  Surgery: Cataract Extraction with Intraocular lens Implant Right Eye  Discription: The patient had dilating drops and viscous lidocaine placed into the left eye in the pre-op holding area. After transfer to the operating room, a time out was performed. The patient was then prepped and draped. Beginning with a 75 degree blade a paracentesis port was made at the surgeon's 2 o'clock position. The anterior chamber was then filled with 2% non-preserved lidocaine. This was followed by filling the anterior chamber with Provisc. A bent cystatome needle was used to create a continuous tear capsulotomy. Hydrodissection was performed with balanced salt solution on a Fine canula. The lens nucleus was then removed using the phacoemulsification handpiece. Residual cortex was removed with the I&A handpiece. The anterior chamber and capsular bag were refilled with Provisc. A posterior chamber intraocular lens was placed into the capsular bag with it's injector. The implant was positioned with the Kuglan hook. The Provisc was then removed from the anterior chamber and capsular bag with the I&A handpiece. Stromal hydration of the main incision and paracentesis port was performed with BSS on a Fine canula. The wounds were tested for leak which was negative. The patient tolerated the procedure well. There were no operative complications. The patient was then transferred to the recovery room in stable condition.  Prosthetic device: B&L enVista, power 20.5D.  Specimen: None  EBL: None  Complications: None

## 2013-04-02 NOTE — Transfer of Care (Signed)
Immediate Anesthesia Transfer of Care Note  Patient: Roberta Gonzalez  Procedure(s) Performed: Procedure(s) with comments: CATARACT EXTRACTION PHACO AND INTRAOCULAR LENS PLACEMENT (IOC) (Right) - CDE 22.44  Patient Location: Short Stay  Anesthesia Type:MAC  Level of Consciousness: awake, alert , oriented and patient cooperative  Airway & Oxygen Therapy: Patient Spontanous Breathing  Post-op Assessment: Report given to PACU RN, Post -op Vital signs reviewed and stable and Patient moving all extremities  Post vital signs: Reviewed and stable  Complications: No apparent anesthesia complications

## 2013-04-02 NOTE — Anesthesia Postprocedure Evaluation (Signed)
  Anesthesia Post-op Note  Patient: Roberta Gonzalez  Procedure(s) Performed: Procedure(s) with comments: CATARACT EXTRACTION PHACO AND INTRAOCULAR LENS PLACEMENT (IOC) (Right) - CDE 22.44  Patient Location: Short Stay  Anesthesia Type:MAC  Level of Consciousness: awake, alert , oriented and patient cooperative  Airway and Oxygen Therapy: Patient Spontanous Breathing  Post-op Pain: none  Post-op Assessment: Post-op Vital signs reviewed, Patient's Cardiovascular Status Stable, Respiratory Function Stable, Patent Airway, No signs of Nausea or vomiting and Pain level controlled  Post-op Vital Signs: Reviewed and stable  Complications: No apparent anesthesia complications

## 2013-04-04 ENCOUNTER — Encounter (HOSPITAL_COMMUNITY): Payer: Self-pay | Admitting: Ophthalmology

## 2013-04-10 ENCOUNTER — Encounter (HOSPITAL_COMMUNITY): Payer: Medicare Other | Attending: Hematology and Oncology

## 2013-04-10 DIAGNOSIS — C50319 Malignant neoplasm of lower-inner quadrant of unspecified female breast: Secondary | ICD-10-CM

## 2013-04-10 DIAGNOSIS — Z95828 Presence of other vascular implants and grafts: Secondary | ICD-10-CM

## 2013-04-10 DIAGNOSIS — Z452 Encounter for adjustment and management of vascular access device: Secondary | ICD-10-CM

## 2013-04-10 DIAGNOSIS — Z9889 Other specified postprocedural states: Secondary | ICD-10-CM | POA: Insufficient documentation

## 2013-04-10 MED ORDER — HEPARIN SOD (PORK) LOCK FLUSH 100 UNIT/ML IV SOLN
INTRAVENOUS | Status: AC
  Filled 2013-04-10: qty 5

## 2013-04-10 MED ORDER — HEPARIN SOD (PORK) LOCK FLUSH 100 UNIT/ML IV SOLN
500.0000 [IU] | Freq: Once | INTRAVENOUS | Status: AC
  Administered 2013-04-10: 500 [IU] via INTRAVENOUS
  Filled 2013-04-10: qty 5

## 2013-04-10 MED ORDER — SODIUM CHLORIDE 0.9 % IJ SOLN
10.0000 mL | INTRAMUSCULAR | Status: DC | PRN
  Administered 2013-04-10: 10 mL via INTRAVENOUS
  Filled 2013-04-10: qty 10

## 2013-04-10 NOTE — Progress Notes (Signed)
Roberta Gonzalez presented for Portacath access and flush. Proper placement of portacath confirmed by CXR. Portacath located left chest wall accessed with  H 20 needle. Good blood return present. Portacath flushed with 20ml NS and 500U/51ml Heparin and needle removed intact. Procedure without incident. Patient tolerated procedure well.

## 2013-04-11 ENCOUNTER — Encounter (HOSPITAL_COMMUNITY): Payer: Self-pay | Admitting: *Deleted

## 2013-04-12 ENCOUNTER — Encounter (HOSPITAL_COMMUNITY): Payer: Self-pay | Admitting: *Deleted

## 2013-04-17 ENCOUNTER — Encounter (HOSPITAL_COMMUNITY): Payer: Self-pay | Admitting: Pharmacy Technician

## 2013-04-19 ENCOUNTER — Encounter (HOSPITAL_COMMUNITY)
Admission: RE | Admit: 2013-04-19 | Discharge: 2013-04-19 | Disposition: A | Discharge status: Home / Self Care | Payer: Medicare Other | Source: Ambulatory Visit | Attending: Ophthalmology | Admitting: Ophthalmology

## 2013-04-23 ENCOUNTER — Encounter (INDEPENDENT_AMBULATORY_CARE_PROVIDER_SITE_OTHER): Payer: Self-pay | Admitting: Internal Medicine

## 2013-04-23 ENCOUNTER — Other Ambulatory Visit (HOSPITAL_COMMUNITY): Payer: Self-pay | Admitting: Oncology

## 2013-04-23 ENCOUNTER — Ambulatory Visit (INDEPENDENT_AMBULATORY_CARE_PROVIDER_SITE_OTHER): Payer: Medicare Other | Admitting: Internal Medicine

## 2013-04-23 ENCOUNTER — Other Ambulatory Visit (INDEPENDENT_AMBULATORY_CARE_PROVIDER_SITE_OTHER): Payer: Self-pay | Admitting: *Deleted

## 2013-04-23 ENCOUNTER — Encounter (INDEPENDENT_AMBULATORY_CARE_PROVIDER_SITE_OTHER): Payer: Self-pay | Admitting: *Deleted

## 2013-04-23 VITALS — BP 100/46 | HR 76 | Temp 97.6°F | Ht 63.0 in | Wt 127.3 lb

## 2013-04-23 DIAGNOSIS — K219 Gastro-esophageal reflux disease without esophagitis: Secondary | ICD-10-CM

## 2013-04-23 NOTE — Patient Instructions (Addendum)
EGD/ED. The risks and benefits such as perforation, bleeding, and infection were reviewed with the patient and is agreeable. 

## 2013-04-23 NOTE — Progress Notes (Signed)
Subjective:     Patient ID: Roberta Gonzalez, female   DOB: [birth year], 72 y.o.   MRN: [id number]  HPI Referred to our office for acid reflux.  She tells me if feels like there is something in her throat and her throat tingles.   If she eats meat, meat will lodge and she will drink water and will go down . Her daughter chops her meat up well. She has frequent acid reflux. Acid reflux on a daily basis.  She is on Dexilant and this is not helping.  Symptoms x 2 yrs.  Symptoms since she started t  Chemo for breast cancer. Off chemo for about a year. Hx of breast cancer. Rt breast removed in 2000 in [place] and left breast in 2012. Appetite is good for the most part. No weight loss.  Usually has a BM about twice a week. She takes OTC laxatives.  She is unable to eat fruits or spicy foods.  She is taking Alendronate but sits up after taking. Taking properly.    Hx of breast cancer. 1. Stage II, grade 3 invasive ductal carcinoma of the left breast with  2/8 positive nodes, ER negative, PR negative, HER2 3+ positive,  Ki-67 marker high at 82%, and her cancer showed  lymphovascular invasion. HER2 was positive with a ratio of 2.09.  Primary was 2.1 cm. She had a 2nd lesion that was 0.4 cm.  Underwent mastectomy 11/27/2010. She therefore had a T2 N1 MX  cancer. She has had 6 cycles of carboplatin with Taxotere and 52  weeks of Herceptin.   Review of Systems.see hpi Current Outpatient Prescriptions  Medication Sig Dispense Refill  . alendronate (FOSAMAX) 70 MG tablet Take 70 mg by mouth every 7 (seven) days. Take with a full glass of water on an empty stomach.      . Alum & Mag Hydroxide-Simeth (GI COCKTAIL) SUSP suspension Take 30 mLs by mouth daily as needed (stomach). Shake well.      . Bromfenac Sodium (PROLENSA) 0.07 % SOLN Place 1 drop into the right eye daily.      . cholecalciferol (VITAMIN D) 400 UNITS TABS Take 1,000 Units by mouth 2 (two) times daily before a meal.      .  Dexlansoprazole 30 MG capsule Take 60 mg by mouth daily.       . Difluprednate (DUREZOL) 0.05 % EMUL Place 1 drop into the right eye 3 (three) times daily.      Marland Kitchen escitalopram (LEXAPRO) 20 MG tablet Take 20 mg by mouth every morning.      . escitalopram (LEXAPRO) 20 MG tablet TAKE ONE TABLET BY MOUTH ONCE DAILY  30 tablet  5  . fish oil-omega-3 fatty acids 1000 MG capsule Take 1 g by mouth 2 (two) times daily.       Marland Kitchen HYDROcodone-acetaminophen (NORCO/VICODIN) 5-325 MG per tablet Take 1 tablet by mouth every 6 (six) hours as needed for pain.      Marland Kitchen ibuprofen (ADVIL,MOTRIN) 200 MG tablet Take 200 mg by mouth every 6 (six) hours as needed for pain.       Marland Kitchen loratadine (CLARITIN) 10 MG tablet Take 10 mg by mouth at bedtime.      . Naphazoline-Pheniramine (OPCON-A) 0.027-0.315 % SOLN Apply to eye.      . naproxen sodium (ALEVE) 220 MG tablet Take 220 mg by mouth 2 (two) times daily as needed (Pain).      Marland Kitchen OVER THE COUNTER MEDICATION Place 1  drop into the left eye 2 (two) times daily. OTC Allergy eye drop      . OVER THE COUNTER MEDICATION Place 1 drop into the left eye daily. "Restore eye drops"      . pravastatin (PRAVACHOL) 40 MG tablet Take 40 mg by mouth at bedtime.       . Prenatal Vit-Fe Psac Cmplx-FA (PRENATAL MULTIVITAMIN) 60-1 MG tablet Take 1 tablet by mouth daily with breakfast.       . sucralfate (CARAFATE) 1 GM/10ML suspension Take 1 g by mouth daily as needed.       No current facility-administered medications for this visit.   Past Surgical History  Procedure Laterality Date  . Mastectomy    . Joint replacement    . Hip replacements    . Cranial shunt    . Abdominal hysterectomy    . Tonsillectomy    . Cataract extraction w/phaco Right 04/02/2013    Procedure: CATARACT EXTRACTION PHACO AND INTRAOCULAR LENS PLACEMENT (IOC);  Surgeon: Gemma Payor, MD;  Location: [facility];  Service: Ophthalmology;  Laterality: Right;  CDE 22.44   Allergies  Allergen Reactions  . Codeine Other (See  Comments)    Makes pt nervous  . Percodan (Oxycodone-Aspirin) Other (See Comments)    Mood change (climbing the walls, angry, irritable)  . Sulfa Antibiotics Nausea Only        Objective:   Physical Exam  Filed Vitals:   04/23/13 1508  BP: 100/46  Pulse: 76  Temp: 97.6 F (36.4 C)  Height: 5\' 3"  (1.6 m)  Weight: 127 lb 4.8 oz (57.743 kg)  Alert and oriented. Skin warm and dry. Oral mucosa is moist.   . Sclera anicteric, conjunctivae is pink. Thyroid not enlarged. No cervical lymphadenopathy. Lungs clear. Heart regular rate and rhythm.  Abdomen is soft. Bowel sounds are positive. No hepatomegaly. No abdominal masses felt. No tenderness.  No edema to lower extremities.        Assessment:   GERD not controlled at this time. Symptoms started after starting chemotherapy. Dysphagia to solids.    Plan:    Continue the Dexilant. Take Carafate QID instead of prn.   EGD/ED with Dr. Karilyn Cota.  GERD diet reviewed with patient.   Stop Aleve, and Advil now.

## 2013-04-26 ENCOUNTER — Ambulatory Visit (HOSPITAL_COMMUNITY)
Admission: RE | Admit: 2013-04-26 | Discharge: 2013-04-26 | Disposition: A | Discharge status: Home / Self Care | Payer: Medicare Other | Source: Ambulatory Visit | Attending: Ophthalmology | Admitting: Ophthalmology

## 2013-04-26 ENCOUNTER — Encounter (HOSPITAL_COMMUNITY)
Admission: RE | Disposition: A | Discharge status: Home / Self Care | Payer: Self-pay | Source: Ambulatory Visit | Attending: Ophthalmology

## 2013-04-26 ENCOUNTER — Ambulatory Visit (HOSPITAL_COMMUNITY): Payer: Medicare Other | Admitting: Anesthesiology

## 2013-04-26 ENCOUNTER — Encounter (HOSPITAL_COMMUNITY): Payer: Self-pay | Admitting: Anesthesiology

## 2013-04-26 ENCOUNTER — Encounter (HOSPITAL_COMMUNITY): Payer: Self-pay | Admitting: Ophthalmology

## 2013-04-26 DIAGNOSIS — Z79899 Other long term (current) drug therapy: Secondary | ICD-10-CM | POA: Insufficient documentation

## 2013-04-26 DIAGNOSIS — H2510 Age-related nuclear cataract, unspecified eye: Secondary | ICD-10-CM | POA: Insufficient documentation

## 2013-04-26 HISTORY — PX: CATARACT EXTRACTION W/PHACO: SHX586

## 2013-04-26 SURGERY — PHACOEMULSIFICATION, CATARACT, WITH IOL INSERTION
Anesthesia: Monitor Anesthesia Care | Site: Eye | Laterality: Left | Wound class: Clean

## 2013-04-26 MED ORDER — LIDOCAINE HCL 3.5 % OP GEL
OPHTHALMIC | Status: AC
  Filled 2013-04-26: qty 5

## 2013-04-26 MED ORDER — MIDAZOLAM HCL 2 MG/2ML IJ SOLN
INTRAMUSCULAR | Status: AC
  Filled 2013-04-26: qty 2

## 2013-04-26 MED ORDER — NEOMYCIN-POLYMYXIN-DEXAMETH 0.1 % OP OINT
TOPICAL_OINTMENT | OPHTHALMIC | Status: DC | PRN
  Administered 2013-04-26: 1 via OPHTHALMIC

## 2013-04-26 MED ORDER — PHENYLEPHRINE HCL 2.5 % OP SOLN
1.0000 [drp] | OPHTHALMIC | Status: AC
  Administered 2013-04-26 (×3): 1 [drp] via OPHTHALMIC

## 2013-04-26 MED ORDER — NEOMYCIN-POLYMYXIN-DEXAMETH 3.5-10000-0.1 OP OINT
TOPICAL_OINTMENT | OPHTHALMIC | Status: AC
  Filled 2013-04-26: qty 3.5

## 2013-04-26 MED ORDER — PHENYLEPHRINE HCL 2.5 % OP SOLN
OPHTHALMIC | Status: AC
  Filled 2013-04-26: qty 2

## 2013-04-26 MED ORDER — TETRACAINE HCL 0.5 % OP SOLN
1.0000 [drp] | OPHTHALMIC | Status: AC
  Administered 2013-04-26 (×3): 1 [drp] via OPHTHALMIC

## 2013-04-26 MED ORDER — EPINEPHRINE HCL 1 MG/ML IJ SOLN
INTRAMUSCULAR | Status: AC
  Filled 2013-04-26: qty 1

## 2013-04-26 MED ORDER — MIDAZOLAM HCL 2 MG/2ML IJ SOLN
1.0000 mg | INTRAMUSCULAR | Status: DC | PRN
  Administered 2013-04-26: 2 mg via INTRAVENOUS

## 2013-04-26 MED ORDER — LIDOCAINE HCL 3.5 % OP GEL: 1.0000 "application " | Freq: Once | OPHTHALMIC | Status: DC

## 2013-04-26 MED ORDER — EPINEPHRINE HCL 1 MG/ML IJ SOLN
INTRAOCULAR | Status: DC | PRN
  Administered 2013-04-26: 14:00:00

## 2013-04-26 MED ORDER — PROVISC 10 MG/ML IO SOLN
INTRAOCULAR | Status: DC | PRN
  Administered 2013-04-26: 8.5 mg via INTRAOCULAR

## 2013-04-26 MED ORDER — TETRACAINE HCL 0.5 % OP SOLN
OPHTHALMIC | Status: AC
  Filled 2013-04-26: qty 2

## 2013-04-26 MED ORDER — LIDOCAINE HCL (PF) 1 % IJ SOLN
INTRAMUSCULAR | Status: DC | PRN
  Administered 2013-04-26: .5 mL

## 2013-04-26 MED ORDER — LIDOCAINE HCL (PF) 1 % IJ SOLN
INTRAMUSCULAR | Status: AC
  Filled 2013-04-26: qty 2

## 2013-04-26 MED ORDER — CYCLOPENTOLATE-PHENYLEPHRINE 0.2-1 % OP SOLN
OPHTHALMIC | Status: AC
  Filled 2013-04-26: qty 2

## 2013-04-26 MED ORDER — POVIDONE-IODINE 5 % OP SOLN
OPHTHALMIC | Status: DC | PRN
  Administered 2013-04-26: 1 via OPHTHALMIC

## 2013-04-26 MED ORDER — BSS IO SOLN
INTRAOCULAR | Status: DC | PRN
  Administered 2013-04-26: 15 mL via INTRAOCULAR

## 2013-04-26 MED ORDER — CYCLOPENTOLATE-PHENYLEPHRINE 0.2-1 % OP SOLN
1.0000 [drp] | OPHTHALMIC | Status: AC
  Administered 2013-04-26 (×3): 1 [drp] via OPHTHALMIC

## 2013-04-26 MED ORDER — LACTATED RINGERS IV SOLN
INTRAVENOUS | Status: DC
  Administered 2013-04-26: 13:00:00 via INTRAVENOUS

## 2013-04-26 MED ORDER — LIDOCAINE HCL 3.5 % OP GEL
OPHTHALMIC | Status: DC | PRN
  Administered 2013-04-26: 1 via OPHTHALMIC

## 2013-04-26 MED ORDER — LACTATED RINGERS IV SOLN
INTRAVENOUS | Status: DC | PRN
  Administered 2013-04-26: 13:00:00 via INTRAVENOUS

## 2013-04-26 SURGICAL SUPPLY — 32 items
CAPSULAR TENSION RING-AMO (OPHTHALMIC RELATED) IMPLANT
CLOTH BEACON ORANGE TIMEOUT ST (SAFETY) ×2 IMPLANT
EYE SHIELD UNIVERSAL CLEAR (GAUZE/BANDAGES/DRESSINGS) ×2 IMPLANT
GLOVE BIO SURGEON STRL SZ 6.5 (GLOVE) IMPLANT
GLOVE BIOGEL PI IND STRL 6.5 (GLOVE) ×1 IMPLANT
GLOVE BIOGEL PI IND STRL 7.0 (GLOVE) IMPLANT
GLOVE BIOGEL PI IND STRL 7.5 (GLOVE) IMPLANT
GLOVE BIOGEL PI INDICATOR 6.5 (GLOVE) ×1
GLOVE BIOGEL PI INDICATOR 7.0 (GLOVE)
GLOVE BIOGEL PI INDICATOR 7.5 (GLOVE)
GLOVE ECLIPSE 6.5 STRL STRAW (GLOVE) IMPLANT
GLOVE ECLIPSE 7.0 STRL STRAW (GLOVE) IMPLANT
GLOVE ECLIPSE 7.5 STRL STRAW (GLOVE) IMPLANT
GLOVE EXAM NITRILE LRG STRL (GLOVE) ×2 IMPLANT
GLOVE EXAM NITRILE MD LF STRL (GLOVE) IMPLANT
GLOVE SKINSENSE NS SZ6.5 (GLOVE)
GLOVE SKINSENSE NS SZ7.0 (GLOVE)
GLOVE SKINSENSE STRL SZ6.5 (GLOVE) IMPLANT
GLOVE SKINSENSE STRL SZ7.0 (GLOVE) IMPLANT
KIT VITRECTOMY (OPHTHALMIC RELATED) IMPLANT
PAD ARMBOARD 7.5X6 YLW CONV (MISCELLANEOUS) ×2 IMPLANT
PROC W NO LENS (INTRAOCULAR LENS)
PROC W SPEC LENS (INTRAOCULAR LENS)
PROCESS W NO LENS (INTRAOCULAR LENS) IMPLANT
PROCESS W SPEC LENS (INTRAOCULAR LENS) IMPLANT
RING MALYGIN (MISCELLANEOUS) IMPLANT
SIGHTPATH CAT PROC W REG LENS (Ophthalmic Related) ×2 IMPLANT
SYR TB 1ML LL NO SAFETY (SYRINGE) ×2 IMPLANT
TAPE SURG TRANSPORE 1 IN (GAUZE/BANDAGES/DRESSINGS) ×1 IMPLANT
TAPE SURGICAL TRANSPORE 1 IN (GAUZE/BANDAGES/DRESSINGS) ×1
VISCOELASTIC ADDITIONAL (OPHTHALMIC RELATED) IMPLANT
WATER STERILE IRR 250ML POUR (IV SOLUTION) ×2 IMPLANT

## 2013-04-26 NOTE — Anesthesia Preprocedure Evaluation (Signed)
Anesthesia Evaluation  Patient identified by MRN, date of birth, ID band Patient awake    Reviewed: Allergy & Precautions, H&P , NPO status , Patient's Chart, lab work & pertinent test results  Airway Mallampati: I TM Distance: >3 FB Neck ROM: Full    Dental  (+) Edentulous Upper and Edentulous Lower   Pulmonary neg pulmonary ROS,  breath sounds clear to auscultation        Cardiovascular Rhythm:Regular Rate:Normal     Neuro/Psych  Headaches, PSYCHIATRIC DISORDERS Depression    GI/Hepatic GERD-  ,  Endo/Other    Renal/GU      Musculoskeletal   Abdominal   Peds  Hematology   Anesthesia Other Findings Bilat breast CA  Reproductive/Obstetrics                           Anesthesia Physical Anesthesia Plan  ASA: III  Anesthesia Plan: MAC   Post-op Pain Management:    Induction: Intravenous  Airway Management Planned: Nasal Cannula  Additional Equipment:   Intra-op Plan:   Post-operative Plan:   Informed Consent: I have reviewed the patients History and Physical, chart, labs and discussed the procedure including the risks, benefits and alternatives for the proposed anesthesia with the patient or authorized representative who has indicated his/her understanding and acceptance.     Plan Discussed with:   Anesthesia Plan Comments:         Anesthesia Quick Evaluation  

## 2013-04-26 NOTE — Transfer of Care (Signed)
Immediate Anesthesia Transfer of Care Note  Patient: Roberta Gonzalez  Procedure(s) Performed: Procedure(s) with comments: CATARACT EXTRACTION PHACO AND INTRAOCULAR LENS PLACEMENT (IOC) (Left) - CDE: 23.60  Patient Location: Short Stay  Anesthesia Type:MAC  Level of Consciousness: awake, alert , oriented and patient cooperative  Airway & Oxygen Therapy: Patient Spontanous Breathing  Post-op Assessment: Report given to PACU RN and Post -op Vital signs reviewed and stable  Post vital signs: Reviewed and stable  Complications: No apparent anesthesia complications

## 2013-04-26 NOTE — Preoperative (Signed)
Beta Blockers   Reason not to administer Beta Blockers:Not Applicable 

## 2013-04-26 NOTE — H&P (Signed)
I have reviewed the H&P, the patient was re-examined, and I have identified no interval changes in medical condition and plan of care since the history and physical of record  

## 2013-04-26 NOTE — Anesthesia Procedure Notes (Signed)
Procedure Name: MAC Date/Time: 04/26/2013 2:01 PM Performed by: Carolyne Littles, AMY L Pre-anesthesia Checklist: Patient identified, Timeout performed, Emergency Drugs available, Suction available and Patient being monitored Oxygen Delivery Method: Nasal cannula

## 2013-04-26 NOTE — Anesthesia Postprocedure Evaluation (Signed)
  Anesthesia Post-op Note  Patient: Roberta Gonzalez  Procedure(s) Performed: Procedure(s) with comments: CATARACT EXTRACTION PHACO AND INTRAOCULAR LENS PLACEMENT (IOC) (Left) - CDE: 23.60  Patient Location: Short Stay  Anesthesia Type:MAC  Level of Consciousness: awake, alert , oriented and patient cooperative  Airway and Oxygen Therapy: Patient Spontanous Breathing  Post-op Pain: none  Post-op Assessment: Post-op Vital signs reviewed, Patient's Cardiovascular Status Stable, Respiratory Function Stable, Patent Airway and No signs of Nausea or vomiting  Post-op Vital Signs: Reviewed and stable  Complications: No apparent anesthesia complications

## 2013-04-26 NOTE — Brief Op Note (Signed)
Pre-Op Dx: Cataract OS Post-Op Dx: Cataract OS Surgeon: Vaishnav Demartin Anesthesia: Topical with MAC Surgery: Cataract Extraction with Intraocular lens Implant OS Implant: B&L enVista Specimen: None Complications: None 

## 2013-04-27 ENCOUNTER — Encounter (HOSPITAL_COMMUNITY): Payer: Self-pay | Admitting: Ophthalmology

## 2013-04-27 NOTE — Op Note (Signed)
NAMELARK, LANGENFELD NO.:  [id number]  MEDICAL RECORD NO.:  [id number]  LOCATION:  [facility]                          FACILITY:  [facility]  PHYSICIAN:  Susanne Greenhouse, MD       DATE OF BIRTH:  Apr 12, 1941  DATE OF PROCEDURE:  04/26/2013 DATE OF DISCHARGE:  04/26/2013                              OPERATIVE REPORT   PREOPERATIVE DIAGNOSIS:  Nuclear cataract, left eye, diagnosis code 366.16.  POSTOPERATIVE DIAGNOSIS:  Nuclear cataract, left eye, diagnosis code 366.16.  TEST PERFORMED:  Phacoemulsification posterior chamber intraocular lens implantation, left eye.  ANESTHESIA:  Topical with monitored anesthesia care and IV sedation.  OPERATIVE SUMMARY:  In the preoperative area, dilating drops were placed into the left eye.  The patient was then brought into the operating room where he was placed under topical anesthesia and IV sedation.  The eye was then prepped and draped.  Beginning with a 75 blade, a paracentesis port was made at the surgeon's 2 o'clock position.  The anterior chamber was then filled with a 1% nonpreserved lidocaine solution with epinephrine.  This was followed by Viscoat to deepen the chamber.  A small fornix-based peritomy was performed superiorly.  Next, a single iris hook was placed through the limbus superiorly.  A 2.4-mm keratome blade was then used to make a clear corneal incision over the iris hook. A bent cystotome needle and Utrata forceps were used to create a continuous tear capsulotomy.  Hydrodissection was performed using balanced salt solution on a fine cannula.  The lens nucleus was then removed using phacoemulsification in a quadrant cracking technique.  The cortical material was then removed with irrigation and aspiration.  The capsular bag and anterior chamber were refilled with Provisc.  The wound was widened to approximately 3 mm and a posterior chamber intraocular lens was placed into the capsular bag without difficulty using an  [employer] lens injecting system.  A single 10-0 nylon suture was then used to close the incision as well as stromal hydration.  The Provisc was removed from the anterior chamber and capsular bag with irrigation and aspiration.  At this point, the wounds were tested for leak, which were negative.  The anterior chamber remained deep and stable.  The patient tolerated the procedure well.  There were no operative complications, and he awoke from topical anesthesia and IV sedation without problem. No surgical specimens.  Prosthetic device used is a Bausch and Lomb posterior chamber lens, model MX60, power of 21.5, serial number is [id number].         ______________________________ Susanne Greenhouse, MD    KEH/MEDQ  D:  04/27/2013  T:  04/27/2013  Job:  [id number]

## 2013-05-03 ENCOUNTER — Encounter (HOSPITAL_COMMUNITY)
Admission: RE | Disposition: A | Discharge status: Home / Self Care | Payer: Self-pay | Source: Ambulatory Visit | Attending: Internal Medicine

## 2013-05-03 ENCOUNTER — Ambulatory Visit (HOSPITAL_COMMUNITY)
Admission: RE | Admit: 2013-05-03 | Discharge: 2013-05-03 | Disposition: A | Discharge status: Home / Self Care | Payer: Medicare Other | Source: Ambulatory Visit | Attending: Internal Medicine | Admitting: Internal Medicine

## 2013-05-03 DIAGNOSIS — K449 Diaphragmatic hernia without obstruction or gangrene: Secondary | ICD-10-CM

## 2013-05-03 DIAGNOSIS — R1310 Dysphagia, unspecified: Secondary | ICD-10-CM

## 2013-05-03 DIAGNOSIS — K219 Gastro-esophageal reflux disease without esophagitis: Secondary | ICD-10-CM

## 2013-05-03 DIAGNOSIS — E7800 Pure hypercholesterolemia, unspecified: Secondary | ICD-10-CM | POA: Insufficient documentation

## 2013-05-03 DIAGNOSIS — Z853 Personal history of malignant neoplasm of breast: Secondary | ICD-10-CM | POA: Insufficient documentation

## 2013-05-03 HISTORY — PX: ESOPHAGOGASTRODUODENOSCOPY (EGD) WITH ESOPHAGEAL DILATION: SHX5812

## 2013-05-03 SURGERY — ESOPHAGOGASTRODUODENOSCOPY (EGD) WITH ESOPHAGEAL DILATION
Anesthesia: Moderate Sedation

## 2013-05-03 MED ORDER — SODIUM CHLORIDE 0.9 % IV SOLN
INTRAVENOUS | Status: DC
  Administered 2013-05-03: 12:00:00 via INTRAVENOUS

## 2013-05-03 MED ORDER — BUTAMBEN-TETRACAINE-BENZOCAINE 2-2-14 % EX AERO
INHALATION_SPRAY | CUTANEOUS | Status: DC | PRN
  Administered 2013-05-03: 2 via TOPICAL

## 2013-05-03 MED ORDER — MEPERIDINE HCL 50 MG/ML IJ SOLN
INTRAMUSCULAR | Status: AC
  Filled 2013-05-03: qty 1

## 2013-05-03 MED ORDER — MEPERIDINE HCL 25 MG/ML IJ SOLN
INTRAMUSCULAR | Status: DC | PRN
  Administered 2013-05-03 (×2): 25 mg via INTRAVENOUS

## 2013-05-03 MED ORDER — STERILE WATER FOR IRRIGATION IR SOLN
Status: DC | PRN
  Administered 2013-05-03: 12:00:00

## 2013-05-03 MED ORDER — MIDAZOLAM HCL 5 MG/5ML IJ SOLN
INTRAMUSCULAR | Status: AC
  Filled 2013-05-03: qty 10

## 2013-05-03 MED ORDER — MIDAZOLAM HCL 5 MG/5ML IJ SOLN
INTRAMUSCULAR | Status: DC | PRN
  Administered 2013-05-03: 2 mg via INTRAVENOUS
  Administered 2013-05-03: 1 mg via INTRAVENOUS
  Administered 2013-05-03: 2 mg via INTRAVENOUS

## 2013-05-03 NOTE — H&P (Signed)
Roberta Gonzalez is an 72 y.o. female.   Chief Complaint: Patient is here for EGD and ED. HPI: Patient is 71 year old Caucasian female with history of GERD who presents with a history of intermittent dysphagia to solids. She's been having difficulty almost daily. She's also having difficulty with liquids at times. She points to the suprasternal area Sarre bolus obstruction. She has good appetite and has maintained her weight. She denies abdominal pain or melena. Patient has been on Fosamax for several years.  Past Medical History  Diagnosis Date  . GERD (gastroesophageal reflux disease) 06/10/2011  . Chest pain 06/10/2011  . Port catheter in place 11/29/2012  . Cancer     jan 2012/mastectomy L; chemo/rad tx  . 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 Hypercholesteremia   . Depression   . Neuropathy   . Breast cancer     right in 2000 in [place] and underwent a mastectomy.    Past Surgical History  Procedure Laterality Date  . Mastectomy    . Joint replacement    . Hip replacements    . Cranial shunt    . Abdominal hysterectomy    . Tonsillectomy    . Cataract extraction w/phaco Right 04/02/2013    Procedure: CATARACT EXTRACTION PHACO AND INTRAOCULAR LENS PLACEMENT (IOC);  Surgeon: Gemma Payor, MD;  Location: [facility];  Service: Ophthalmology;  Laterality: Right;  CDE 22.44  . Cataract extraction w/phaco Left 04/26/2013    Procedure: CATARACT EXTRACTION PHACO AND INTRAOCULAR LENS PLACEMENT (IOC);  Surgeon: Gemma Payor, MD;  Location: [facility];  Service: Ophthalmology;  Laterality: Left;  CDE: 23.60    No family history on file. Social History:  reports that she has quit smoking. She does not have any smokeless tobacco history on file. She reports that  drinks alcohol. She reports that she does not use illicit drugs.  Allergies:  Allergies  Allergen Reactions  . Codeine Other (See Comments)    Makes pt nervous  . Percodan (Oxycodone-Aspirin) Other (See Comments)    Mood change  (climbing the walls, angry, irritable)  . Sulfa Antibiotics Nausea Only    Medications Prior to Admission  Medication Sig Dispense Refill  . alendronate (FOSAMAX) 70 MG tablet Take 70 mg by mouth every 7 (seven) days. Take with a full glass of water on an empty stomach.      . Alum & Mag Hydroxide-Simeth (GI COCKTAIL) SUSP suspension Take 30 mLs by mouth daily as needed (stomach). Shake well.      . Bromfenac Sodium (PROLENSA) 0.07 % SOLN Place 1 drop into the right eye daily.      . cholecalciferol (VITAMIN D) 400 UNITS TABS Take 1,000 Units by mouth 2 (two) times daily before a meal.      . Dexlansoprazole 30 MG capsule Take 60 mg by mouth daily.       . Difluprednate (DUREZOL) 0.05 % EMUL Place 1 drop into the right eye 3 (three) times daily.      Marland Kitchen escitalopram (LEXAPRO) 20 MG tablet Take 20 mg by mouth every morning.      . escitalopram (LEXAPRO) 20 MG tablet TAKE ONE TABLET BY MOUTH ONCE DAILY  30 tablet  5  . fish oil-omega-3 fatty acids 1000 MG capsule Take 1 g by mouth 2 (two) times daily.       Marland Kitchen HYDROcodone-acetaminophen (NORCO/VICODIN) 5-325 MG per tablet Take 1 tablet by mouth every 6 (six) hours as needed for pain.      Marland Kitchen  ibuprofen (ADVIL,MOTRIN) 200 MG tablet Take 200 mg by mouth every 6 (six) hours as needed for pain.       Marland Kitchen loratadine (CLARITIN) 10 MG tablet Take 10 mg by mouth at bedtime.      . Naphazoline-Pheniramine (OPCON-A) 0.027-0.315 % SOLN Apply to eye.      . naproxen sodium (ALEVE) 220 MG tablet Take 220 mg by mouth 2 (two) times daily as needed (Pain).      Marland Kitchen OVER THE COUNTER MEDICATION Place 1 drop into the left eye 2 (two) times daily. OTC Allergy eye drop      . OVER THE COUNTER MEDICATION Place 1 drop into the left eye daily. "Restore eye drops"      . pravastatin (PRAVACHOL) 40 MG tablet Take 40 mg by mouth at bedtime.       . Prenatal Vit-Fe Psac Cmplx-FA (PRENATAL MULTIVITAMIN) 60-1 MG tablet Take 1 tablet by mouth daily with breakfast.       . sucralfate  (CARAFATE) 1 GM/10ML suspension Take 1 g by mouth daily as needed.        No results found for this or any previous visit (from the past 48 hour(s)). No results found.  ROS  Blood pressure 102/42, pulse 74, temperature 98 F (36.7 C), temperature source Oral, resp. rate 18, SpO2 98.00%. Physical Exam  Constitutional: She appears well-developed and well-nourished.  HENT:  Mouth/Throat: Oropharynx is clear and moist.  Eyes: Conjunctivae are normal. No scleral icterus.  Neck: No thyromegaly present.  Cardiovascular: Normal rate, regular rhythm and normal heart sounds.   Murmur: faint SEM at LLSB. Respiratory: Effort normal and breath sounds normal.  GI: Soft. She exhibits no distension and no mass. There is no tenderness.  Musculoskeletal: She exhibits no edema.  Lymphadenopathy:    She has no cervical adenopathy.  Neurological: She is alert.  Skin: Skin is warm.     Assessment/Plan Solid food dysphagia. EGD and ED.  REHMAN,NAJEEB U 05/03/2013, 12:21 PM

## 2013-05-03 NOTE — Op Note (Signed)
EGD PROCEDURE REPORT  PATIENT:  Roberta Gonzalez  MR#:  [id number] Birthdate:  06-05-1941, 72 y.o., female Endoscopist:  Dr. Malissa Hippo, MD Referred By:  Dr. Duke Salvia. Delbert Harness, MD  Procedure Date: 05/03/2013  Procedure:   EGD with ED.  Indications:  Patient is 72 year old Caucasian female who has chronic GERD who presents with dysphagia primarily to solids.            Informed Consent:  The risks, benefits, alternatives & imponderables which include, but are not limited to, bleeding, infection, perforation, drug reaction and potential missed lesion have been reviewed.  The potential for biopsy, lesion removal, esophageal dilation, etc. have also been discussed.  Questions have been answered.  All parties agreeable.  Please see history & physical in medical record for more information.  Medications:  Demerol 50 mg IV Versed 5 mg IV Cetacaine spray topically for oropharyngeal anesthesia  Description of procedure:  The endoscope was introduced through the mouth and advanced to the second portion of the duodenum without difficulty or limitations. The mucosal surfaces were surveyed very carefully during advancement of the scope and upon withdrawal.  Findings:  Esophagus:  Mucosa of the esophagus appeared to be unremarkable. Incomplete ring noted at GE junction which was wide open. GEJ:  35 cm Hiatus:  37 cm Stomach:  Stomach was empty and distended very well with insufflation. Folds in the proximal stomach were normal. Few small hyperplastic appearing polyps noted at gastric body and fundus. Antral and pyloric channel mucosa was normal. Pyloric channel was patent. Fundus and cardia were unremarkable except few small polyps as above. Duodenum:  Normal bulbar and post bulbar mucosa.  Therapeutic/Diagnostic Maneuvers Performed:   Esophagus initially dilated by passing 48 and 52 [place] Maloney dilators. Esophageal mucosa was examined post dilation and no mucosal disruption noted. Esophagus  was further dilated by passing 54 French Maloney dilator but once again no mucosal disruption noted. On the way out biopsy was taken from his aphasia and body for routine histology.  Complications:  None  Impression: Small sliding hiatal hernia with incomplete ring at GEJ. Esophagus dilated by passing 48, 52 and 54 French Maloney dilators but no mucosal disruption noted. Biopsy taken from mucosa at esophageal body for routine histology  Recommendations:  Continue anti-reflux measures and PPI as before. I will be contacting patient with results of biopsy and further recommendations.  REHMAN,NAJEEB U  05/03/2013  12:53 PM  CC: Dr. [name], MD & Dr. Bonnetta Barry ref. provider found

## 2013-05-07 ENCOUNTER — Encounter (HOSPITAL_COMMUNITY): Payer: Self-pay | Admitting: Internal Medicine

## 2013-05-09 ENCOUNTER — Other Ambulatory Visit (INDEPENDENT_AMBULATORY_CARE_PROVIDER_SITE_OTHER): Payer: Self-pay | Admitting: Internal Medicine

## 2013-05-09 DIAGNOSIS — R1310 Dysphagia, unspecified: Secondary | ICD-10-CM

## 2013-05-11 ENCOUNTER — Encounter (INDEPENDENT_AMBULATORY_CARE_PROVIDER_SITE_OTHER): Payer: Self-pay | Admitting: *Deleted

## 2013-05-14 ENCOUNTER — Telehealth (HOSPITAL_COMMUNITY): Payer: Self-pay | Admitting: Oncology

## 2013-05-14 ENCOUNTER — Other Ambulatory Visit (HOSPITAL_COMMUNITY): Payer: Self-pay | Admitting: Oncology

## 2013-05-14 NOTE — Telephone Encounter (Signed)
This refill should be sent to GI

## 2013-05-22 ENCOUNTER — Encounter (HOSPITAL_COMMUNITY): Payer: Medicare Other | Attending: Hematology and Oncology

## 2013-05-22 ENCOUNTER — Encounter (HOSPITAL_COMMUNITY): Payer: Medicare Other

## 2013-05-22 DIAGNOSIS — Z452 Encounter for adjustment and management of vascular access device: Secondary | ICD-10-CM

## 2013-05-22 DIAGNOSIS — Z95828 Presence of other vascular implants and grafts: Secondary | ICD-10-CM

## 2013-05-22 DIAGNOSIS — Z9889 Other specified postprocedural states: Secondary | ICD-10-CM | POA: Insufficient documentation

## 2013-05-22 DIAGNOSIS — C50319 Malignant neoplasm of lower-inner quadrant of unspecified female breast: Secondary | ICD-10-CM

## 2013-05-22 MED ORDER — HEPARIN SOD (PORK) LOCK FLUSH 100 UNIT/ML IV SOLN
500.0000 [IU] | Freq: Once | INTRAVENOUS | Status: AC
  Administered 2013-05-22: 500 [IU] via INTRAVENOUS
  Filled 2013-05-22: qty 5

## 2013-05-22 MED ORDER — HEPARIN SOD (PORK) LOCK FLUSH 100 UNIT/ML IV SOLN
INTRAVENOUS | Status: AC
  Filled 2013-05-22: qty 5

## 2013-05-22 MED ORDER — SODIUM CHLORIDE 0.9 % IJ SOLN
10.0000 mL | INTRAMUSCULAR | Status: DC | PRN
Start: 2013-05-22 — End: 2013-05-22
  Administered 2013-05-22: 10 mL via INTRAVENOUS
  Filled 2013-05-22: qty 10

## 2013-05-22 NOTE — Progress Notes (Signed)
Roberta Gonzalez presented for Portacath access and flush. Proper placement of portacath confirmed by CXR. Portacath located right chest wall accessed with  H 20 needle. Good blood return present. Portacath flushed with 20ml NS and 500U/5ml Heparin and needle removed intact. Procedure without incident. Patient tolerated procedure well.   

## 2013-05-31 ENCOUNTER — Other Ambulatory Visit (HOSPITAL_COMMUNITY): Payer: PRIVATE HEALTH INSURANCE

## 2013-06-01 ENCOUNTER — Ambulatory Visit (HOSPITAL_COMMUNITY)
Admission: RE | Admit: 2013-06-01 | Discharge: 2013-06-01 | Disposition: A | Discharge status: Home / Self Care | Payer: Medicare Other | Source: Ambulatory Visit | Attending: Internal Medicine | Admitting: Internal Medicine

## 2013-06-01 DIAGNOSIS — R1310 Dysphagia, unspecified: Secondary | ICD-10-CM

## 2013-06-01 DIAGNOSIS — K225 Diverticulum of esophagus, acquired: Secondary | ICD-10-CM | POA: Insufficient documentation

## 2013-06-01 DIAGNOSIS — K449 Diaphragmatic hernia without obstruction or gangrene: Secondary | ICD-10-CM | POA: Insufficient documentation

## 2013-06-01 DIAGNOSIS — K222 Esophageal obstruction: Secondary | ICD-10-CM | POA: Insufficient documentation

## 2013-07-03 ENCOUNTER — Encounter (HOSPITAL_COMMUNITY): Payer: Medicare Other | Attending: Hematology and Oncology

## 2013-07-03 DIAGNOSIS — Z452 Encounter for adjustment and management of vascular access device: Secondary | ICD-10-CM

## 2013-07-03 DIAGNOSIS — Z95828 Presence of other vascular implants and grafts: Secondary | ICD-10-CM

## 2013-07-03 DIAGNOSIS — C50319 Malignant neoplasm of lower-inner quadrant of unspecified female breast: Secondary | ICD-10-CM

## 2013-07-03 DIAGNOSIS — Z9889 Other specified postprocedural states: Secondary | ICD-10-CM | POA: Insufficient documentation

## 2013-07-03 MED ORDER — HEPARIN SOD (PORK) LOCK FLUSH 100 UNIT/ML IV SOLN
500.0000 [IU] | Freq: Once | INTRAVENOUS | Status: AC
  Administered 2013-07-03: 500 [IU] via INTRAVENOUS
  Filled 2013-07-03: qty 5

## 2013-07-03 MED ORDER — HEPARIN SOD (PORK) LOCK FLUSH 100 UNIT/ML IV SOLN
INTRAVENOUS | Status: AC
  Filled 2013-07-03: qty 5

## 2013-07-03 MED ORDER — SODIUM CHLORIDE 0.9 % IJ SOLN
10.0000 mL | INTRAMUSCULAR | Status: DC | PRN
  Administered 2013-07-03: 10 mL via INTRAVENOUS
  Filled 2013-07-03: qty 10

## 2013-07-03 NOTE — Progress Notes (Signed)
Roberta Gonzalez presented for Portacath access and flush. Proper placement of portacath confirmed by CXR. Portacath located right chest wall accessed with  H 20 needle. Good blood return present. Portacath flushed with 20ml NS and 500U/5ml Heparin and needle removed intact. Procedure without incident. Patient tolerated procedure well.   

## 2013-07-16 ENCOUNTER — Encounter (HOSPITAL_COMMUNITY): Payer: Self-pay | Admitting: Oncology

## 2013-07-16 ENCOUNTER — Encounter (HOSPITAL_BASED_OUTPATIENT_CLINIC_OR_DEPARTMENT_OTHER): Payer: Medicare Other | Admitting: Oncology

## 2013-07-16 VITALS — BP 104/65 | HR 90 | Temp 98.2°F | Resp 16 | Wt 123.2 lb

## 2013-07-16 DIAGNOSIS — C50319 Malignant neoplasm of lower-inner quadrant of unspecified female breast: Secondary | ICD-10-CM

## 2013-07-16 DIAGNOSIS — C773 Secondary and unspecified malignant neoplasm of axilla and upper limb lymph nodes: Secondary | ICD-10-CM

## 2013-07-16 DIAGNOSIS — C50919 Malignant neoplasm of unspecified site of unspecified female breast: Secondary | ICD-10-CM

## 2013-07-16 NOTE — Progress Notes (Signed)
Roberta Stalling, MD [address] [place] [address]  Invasive ductal carcinoma of breast, unspecified laterality  CURRENT THERAPY: Surveillance  INTERVAL HISTORY: Roberta Gonzalez 72 y.o. 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 HER2 was positive with a ratio of 2.09. Primary was 2.1 cm. She had a 2nd lesion that was 0.4 cm. Underwent mastectomy 11/27/2010. She therefore had a T2 N1 MX cancer. She has had 6 cycles of carboplatin with Taxotere and 52 weeks of Herceptin (finishing on 12/03/2011).   S/P EGD with esophageal dilation by Dr. Karilyn Cota on 05/03/2013.  She is also S/P Phacoemulsification posterior chamber intraocular lens implantation, left eye on 04/27/2013 by Dr. Gemma Payor.  From an oncology standpoint, she is doing well.  She denies any new lumps or bumps on her body.  She denies any breast changes.  She does report left axilla tightness with mild tenderness on shoulder abduction.  I recommended gentle massage of that area when resting.    As a side note, she admits to intermittent chest pain that radiates between her shoulder blades.  She reports that it resolves spontaneously and she explains that she does not notice a pattern or association with the discomfort.  She denies any increased chest pain with activity or anxiety.  I have asked her to follow-up with PCP regarding the discomfort.   Oncologically, she denies any complaints and ROS questioning is negative.   Past Medical History  Diagnosis Date  . GERD (gastroesophageal reflux disease) 06/10/2011  . Chest pain 06/10/2011  . Port catheter in place 11/29/2012  . Cancer     jan 2012/mastectomy L; chemo/rad tx  . 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 Hypercholesteremia   . Depression   .  Neuropathy   . Breast cancer     right in 2000 in [place] and underwent a mastectomy.    has Invasive ductal carcinoma of breast; GERD (gastroesophageal reflux disease); Chest pain; and Port catheter in place on her problem list.     is allergic to codeine; percodan; and sulfa antibiotics.  Ms. Downen had no medications administered during this visit.  Past Surgical History  Procedure Laterality Date  . Mastectomy    . Joint replacement    . Hip replacements    . Cranial shunt    . Abdominal hysterectomy    . Tonsillectomy    . Cataract extraction w/phaco Right 04/02/2013    Procedure: CATARACT EXTRACTION PHACO AND INTRAOCULAR LENS PLACEMENT (IOC);  Surgeon: Gemma Payor, MD;  Location: [facility];  Service: Ophthalmology;  Laterality: Right;  CDE 22.44  . Cataract extraction w/phaco Left 04/26/2013    Procedure: CATARACT EXTRACTION PHACO AND INTRAOCULAR LENS PLACEMENT (IOC);  Surgeon: Gemma Payor, MD;  Location: [facility];  Service: Ophthalmology;  Laterality: Left;  CDE: 23.60  . Esophagogastroduodenoscopy (egd) with esophageal dilation N/A 05/03/2013    Procedure: ESOPHAGOGASTRODUODENOSCOPY (EGD) WITH ESOPHAGEAL DILATION;  Surgeon: Malissa Hippo, MD;  Location: [facility];  Service: Endoscopy;  Laterality: N/A;  145-moved to 100 Ann to notify pt    Denies any headaches, dizziness, double vision, fevers, chills, night sweats, nausea, vomiting, diarrhea, constipation, chest pain, heart palpitations, shortness of breath, blood in stool, black tarry stool, urinary pain, urinary burning, urinary frequency, hematuria.  PHYSICAL EXAMINATION  ECOG PERFORMANCE STATUS: 1 - Symptomatic but completely ambulatory  Filed Vitals:   07/16/13 1100  BP: 104/65  Pulse: 90  Temp: 98.2 F (36.8 C)  Resp: 16    GENERAL:alert, no distress, well nourished, well developed, comfortable, cooperative and smiling SKIN: skin color, texture, turgor are normal, no rashes or significant lesions.  LE dry  skin. HEAD: Normocephalic, No masses, lesions, tenderness or abnormalities EYES: normal, PERRLA, EOMI, Conjunctiva are pink and non-injected EARS: External ears normal OROPHARYNX:mucous membranes are moist, edentulous NECK: supple, no adenopathy, thyroid normal size, non-tender, without nodularity, no stridor, non-tender, trachea midline LYMPH:  no palpable lymphadenopathy, no hepatosplenomegaly BREAST:B/L post-mastectomy site well healed and free of suspicious changes with some left, lateral breast excess tissue LUNGS: clear to auscultation and percussion HEART: regular rate & rhythm, no murmurs, no gallops, S1 normal and S2 normal ABDOMEN:abdomen soft, non-tender, normal bowel sounds, no masses or organomegaly and no hepatosplenomegaly BACK: Back symmetric, no curvature., No CVA tenderness EXTREMITIES:less then 2 second capillary refill, no joint deformities, effusion, or inflammation, no edema, no skin discoloration, no clubbing, no cyanosis  NEURO: alert & oriented x 3 with fluent speech, no focal motor/sensory deficits, gait normal    LABORATORY DATA: CBC    Component Value Date/Time   WBC 5.0 10/22/2011 0933   RBC 3.30* 10/22/2011 0933   HGB 12.3 03/26/2013 1024   HCT 37.0 03/26/2013 1024   PLT 231 10/22/2011 0933   MCV 101.8* 10/22/2011 0933   MCH 33.9 10/22/2011 0933   MCHC 33.3 10/22/2011 0933   RDW 12.0 10/22/2011 0933   LYMPHSABS 0.9 10/22/2011 0933   MONOABS 0.7 10/22/2011 0933   EOSABS 0.5 10/22/2011 0933   BASOSABS 0.0 10/22/2011 0933      Chemistry      Component Value Date/Time   NA 140 03/26/2013 1024   K 3.9 03/26/2013 1024   CL 103 03/26/2013 1024   CO2 29 03/26/2013 1024   BUN 13 03/26/2013 1024   CREATININE 0.56 03/26/2013 1024      Component Value Date/Time   CALCIUM 9.7 03/26/2013 1024   ALKPHOS 46 10/22/2011 0933   AST 15 10/22/2011 0933   ALT 18 10/22/2011 0933   BILITOT 0.3 10/22/2011 0933         ASSESSMENT:  1. Stage II, grade 3 invasive ductal  carcinoma of the left breast with 2/8 positive nodes, ER negative, PR negative, HER2 3+ positive, Ki-67 marker high at 82%, and her cancer showed lymphovascular invasion. HER2 was positive with a ratio of 2.09. Primary was 2.1 cm. She had a 2nd lesion that was 0.4 cm. Underwent mastectomy 11/27/2010. She therefore had a T2 N1 MX cancer. She has had 6 cycles of carboplatin with Taxotere and 52 weeks of Herceptin (finishing on 12/03/2011).  2. Gastroesophageal reflux disease.  3. Bilateral hip replacements with chronic hip discomfort. Her daughter states she also has a history of spinal stenosis.  4. Deconditioning with very difficult issues relating to walking, cycling, swimming, because of diagnoses #2  5. Chronic constipation 6. 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  7. History of right mastectomy in 2001. 8. Intermittent chest pain that radiates between scapula.  Recommended follow-up with PCP  Patient Active Problem List   Diagnosis Date Noted  . Port catheter in place 11/29/2012  . 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 GERD (gastroesophageal reflux disease) 06/10/2011  . Chest pain 06/10/2011  PLAN:  1. I personally reviewed and went over laboratory results with the patient. 2. No need for blood work today. 3. Patient education regarding her breast cancer.  She is educated that her first 2 years are crucial for her and therefore we will see her every 3-4 months x 2 years from completion of Herceptin (09/2012).   She is agreeable to this plan. 4. Recommended follow-up with PCP regarding chest pain.  5. Return in 3-4 months for follow-up   THERAPY PLAN:  Will see her every 3-4 months x 1st 2 years (through Nove 2015) and then space out appointments as deemed clinically appropriate.   All questions were answered. The patient knows to call the clinic with any problems, questions or concerns. We can certainly see the patient much  sooner if necessary.  Patient and plan discussed with Dr. Erline Hau and he is in agreement with the aforementioned.   KEFALAS,THOMAS

## 2013-07-16 NOTE — Patient Instructions (Addendum)
[facility] Discharge Instructions  RECOMMENDATIONS MADE BY THE CONSULTANT AND ANY TEST RESULTS WILL BE SENT TO YOUR REFERRING PHYSICIAN.  EXAM FINDINGS BY THE PHYSICIAN TODAY AND SIGNS OR SYMPTOMS TO REPORT TO CLINIC OR PRIMARY PHYSICIAN: Exam and discussion by Dellis Anes PA-C.  No evidence of recurrence by exam.  MEDICATIONS PRESCRIBED:  none  INSTRUCTIONS GIVEN AND DISCUSSED: Report any new lumps, bone pain, shortness of breath or other symptoms.  SPECIAL INSTRUCTIONS/FOLLOW-UP: Port flushes every 6 weeks and follow-up in 31/2 months.  Thank you for choosing [facility] to provide your oncology and hematology care.  To afford each patient quality time with our providers, please arrive at least 15 minutes before your scheduled appointment time.  With your help, our goal is to use those 15 minutes to complete the necessary work-up to ensure our physicians have the information they need to help with your evaluation and healthcare recommendations.    Effective January 1st, 2014, we ask that you re-schedule your appointment with our physicians should you arrive 10 or more minutes late for your appointment.  We strive to give you quality time with our providers, and arriving late affects you and other patients whose appointments are after yours.    Again, thank you for choosing [facility].  Our hope is that these requests will decrease the amount of time that you wait before being seen by our physicians.       _____________________________________________________________  Should you have questions after your visit to [facility], please contact our office at [phone] between the hours of 8:30 a.m. and 5:00 p.m.  Voicemails left after 4:30 p.m. will not be returned until the following business day.  For prescription refill requests, have your pharmacy contact our office with your prescription refill request.

## 2013-08-14 ENCOUNTER — Encounter (HOSPITAL_COMMUNITY): Payer: Medicare Other | Attending: Hematology and Oncology

## 2013-08-14 DIAGNOSIS — Z9889 Other specified postprocedural states: Secondary | ICD-10-CM | POA: Insufficient documentation

## 2013-08-14 DIAGNOSIS — Z452 Encounter for adjustment and management of vascular access device: Secondary | ICD-10-CM

## 2013-08-14 DIAGNOSIS — Z95828 Presence of other vascular implants and grafts: Secondary | ICD-10-CM

## 2013-08-14 DIAGNOSIS — C50319 Malignant neoplasm of lower-inner quadrant of unspecified female breast: Secondary | ICD-10-CM

## 2013-08-14 MED ORDER — SODIUM CHLORIDE 0.9 % IJ SOLN
10.0000 mL | INTRAMUSCULAR | Status: DC | PRN
  Administered 2013-08-14: 10 mL via INTRAVENOUS

## 2013-08-14 MED ORDER — HEPARIN SOD (PORK) LOCK FLUSH 100 UNIT/ML IV SOLN
INTRAVENOUS | Status: AC
  Filled 2013-08-14: qty 5

## 2013-08-14 MED ORDER — HEPARIN SOD (PORK) LOCK FLUSH 100 UNIT/ML IV SOLN
500.0000 [IU] | Freq: Once | INTRAVENOUS | Status: AC
  Administered 2013-08-14: 500 [IU] via INTRAVENOUS

## 2013-08-14 NOTE — Progress Notes (Signed)
Roberta Gonzalez presented for Portacath access and flush. Proper placement of portacath confirmed by CXR. Portacath located right chest wall accessed with  H 20 needle. Good blood return present. Portacath flushed with 20ml NS and 500U/5ml Heparin and needle removed intact. Procedure without incident. Patient tolerated procedure well.   

## 2013-09-04 ENCOUNTER — Encounter (INDEPENDENT_AMBULATORY_CARE_PROVIDER_SITE_OTHER): Payer: Self-pay | Admitting: *Deleted

## 2013-09-20 ENCOUNTER — Ambulatory Visit (INDEPENDENT_AMBULATORY_CARE_PROVIDER_SITE_OTHER): Payer: Medicare Other | Admitting: Internal Medicine

## 2013-09-25 ENCOUNTER — Encounter (HOSPITAL_COMMUNITY): Payer: Medicare Other

## 2013-09-26 ENCOUNTER — Encounter (HOSPITAL_COMMUNITY): Payer: Medicare Other

## 2013-10-02 ENCOUNTER — Encounter (HOSPITAL_COMMUNITY): Payer: Medicare Other

## 2013-10-03 ENCOUNTER — Encounter (HOSPITAL_COMMUNITY): Payer: Medicare Other

## 2013-10-04 ENCOUNTER — Ambulatory Visit (INDEPENDENT_AMBULATORY_CARE_PROVIDER_SITE_OTHER): Payer: Medicare Other | Admitting: Internal Medicine

## 2013-10-10 ENCOUNTER — Encounter (HOSPITAL_COMMUNITY): Payer: Medicare Other | Attending: Hematology and Oncology

## 2013-10-10 DIAGNOSIS — Z95828 Presence of other vascular implants and grafts: Secondary | ICD-10-CM

## 2013-10-10 DIAGNOSIS — Z452 Encounter for adjustment and management of vascular access device: Secondary | ICD-10-CM

## 2013-10-10 DIAGNOSIS — Z9889 Other specified postprocedural states: Secondary | ICD-10-CM | POA: Insufficient documentation

## 2013-10-10 DIAGNOSIS — C50319 Malignant neoplasm of lower-inner quadrant of unspecified female breast: Secondary | ICD-10-CM

## 2013-10-10 MED ORDER — HEPARIN SOD (PORK) LOCK FLUSH 100 UNIT/ML IV SOLN
INTRAVENOUS | Status: AC
  Filled 2013-10-10: qty 5

## 2013-10-10 MED ORDER — HEPARIN SOD (PORK) LOCK FLUSH 100 UNIT/ML IV SOLN
500.0000 [IU] | Freq: Once | INTRAVENOUS | Status: AC
  Administered 2013-10-10: 500 [IU] via INTRAVENOUS

## 2013-10-10 MED ORDER — SODIUM CHLORIDE 0.9 % IJ SOLN
10.0000 mL | INTRAMUSCULAR | Status: DC | PRN
  Administered 2013-10-10: 10 mL via INTRAVENOUS

## 2013-10-10 NOTE — Progress Notes (Signed)
Roberta Gonzalez presented for Portacath access and flush. Portacath located  Right chest wall accessed with  H 20 needle. Good blood return present. Portacath flushed with 20ml NS and 500U/34ml Heparin and needle removed intact. Procedure without incident. Patient tolerated procedure well.

## 2013-10-21 NOTE — Progress Notes (Signed)
Roberta Stalling, MD [address] [place] [address]  Invasive ductal carcinoma of breast, right - Plan: CBC with Differential, Comprehensive metabolic panel  CURRENT THERAPY: Surveillance per NCCN guidelines  INTERVAL HISTORY: Roberta Gonzalez 72 y.o. 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 HER2 was positive with a ratio of 2.09. Primary was 2.1 cm. She had a 2nd lesion that was 0.4 cm. Underwent mastectomy 11/27/2010. She therefore had a T2 N1 MX cancer. She has had 6 cycles of carboplatin with Taxotere and 52 weeks of Herceptin (finishing on 12/03/2011).      Invasive ductal carcinoma of breast   10/28/2010 Initial Diagnosis Breast, left, needle core biopsy, :  - INVASIVE DUCTAL CARCINOMA.   11/27/2010 Surgery Left simple mastectomy: Multicentric invasive ductal cancer, 2.1 and 0.4 cm, +LVI, 2/8 positive lymph nodes.  ER-, PR-, HER2 3+, Ki-67 82%.   12/07/2010 - 05/03/2011 Chemotherapy Carboplatin/Taxotere x 6 cycles (Approximate dates)   12/07/2010 - 12/03/2011 Antibody Plan 52 weeks of Herceptin (Aproximate start date)   05/05/2011 Remission     I personally reviewed and went over laboratory results with the patient.  Labs are outdated and therefore, we will get labs today or with her next port flush.    NCCN guidelines recommends the following surveillance for invasive breast cancer:  A. History and Physical exam every 4-6 months for 5 years and then every 12 months.  B. Mammography every 12 months  C. Women on Tamoxifen: annual gynecologic assessment every 12 months if uterus is present.  D. Women on aromatase inhibitor or who experience ovarian failure secondary to treatment should have monitoring of bone health with a bone mineral density determination at  baseline and periodically thereafter.  E. Assess and encourage adherence to adjuvant endocrine therapy.  F. Evidence suggests that active lifestyle and achieving and maintaining an ideal body weight (20-25 BMI) may lead to optimal breast cancer outcomes.  Roberta Gonzalez denies any complaints and ROS questioning is negative from an oncology standpoint.   She is recently a great grandmother and is very excited about this.   Past Medical History  Diagnosis Date  . GERD (gastroesophageal reflux disease) 06/10/2011  . Chest pain 06/10/2011  . Port catheter in place 11/29/2012  . Cancer     jan 2012/mastectomy L; chemo/rad tx  . 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 Hypercholesteremia   . Depression   . Neuropathy   . Breast cancer     right in 2000 in [place] and underwent a mastectomy.    has Invasive ductal carcinoma of breast; GERD (gastroesophageal reflux disease); Chest pain; and Port catheter in place on her problem list.     is allergic to codeine; percodan; and sulfa antibiotics.  Roberta Gonzalez had no medications administered during this visit.  Past Surgical History  Procedure Laterality Date  . Mastectomy    . Joint replacement    . Hip replacements    . Cranial shunt    . Abdominal hysterectomy    . Tonsillectomy    . Cataract extraction w/phaco Right 04/02/2013    Procedure: CATARACT EXTRACTION PHACO AND INTRAOCULAR LENS PLACEMENT (IOC);  Surgeon: Gemma Payor, MD;  Location: [facility];  Service: Ophthalmology;  Laterality: Right;  CDE 22.44  . Cataract extraction  w/phaco Left 04/26/2013    Procedure: CATARACT EXTRACTION PHACO AND INTRAOCULAR LENS PLACEMENT (IOC);  Surgeon: Gemma Payor, MD;  Location: [facility];  Service: Ophthalmology;  Laterality: Left;  CDE: 23.60  . Esophagogastroduodenoscopy (egd) with esophageal dilation N/A 05/03/2013    Procedure: ESOPHAGOGASTRODUODENOSCOPY (EGD) WITH ESOPHAGEAL DILATION;  Surgeon: Malissa Hippo, MD;  Location: [facility];  Service:  Endoscopy;  Laterality: N/A;  145-moved to 100 Ann to notify pt    Denies any headaches, dizziness, double vision, fevers, chills, night sweats, nausea, vomiting, diarrhea, constipation, chest pain, heart palpitations, shortness of breath, blood in stool, black tarry stool, urinary pain, urinary burning, urinary frequency, hematuria.   PHYSICAL EXAMINATION  ECOG PERFORMANCE STATUS: 0 - Asymptomatic  Filed Vitals:   10/22/13 1026  BP: 94/62  Pulse: 86  Temp: 98.4 F (36.9 C)  Resp: 16    GENERAL:alert, no distress, well nourished, well developed, comfortable, cooperative and smiling SKIN: skin color, texture, turgor are normal, no rashes or significant lesions HEAD: Normocephalic, No masses, lesions, tenderness or abnormalities EYES: normal, PERRLA, EOMI, Conjunctiva are pink and non-injected EARS: External ears normal OROPHARYNX:mucous membranes are moist  NECK: supple, no adenopathy, thyroid normal size, non-tender, without nodularity, no stridor, non-tender, trachea midline LYMPH:  no palpable lymphadenopathy, no hepatosplenomegaly BREAST:bilateral post-mastectomy site well healed and free of suspicious changes LUNGS: clear to auscultation and percussion HEART: regular rate & rhythm, no murmurs, no gallops, S1 normal and S2 normal ABDOMEN:abdomen soft, non-tender, normal bowel sounds, no masses or organomegaly and no hepatosplenomegaly BACK: Back symmetric, no curvature., No CVA tenderness EXTREMITIES:less then 2 second capillary refill, no joint deformities, effusion, or inflammation, no edema, no skin discoloration, no clubbing, no cyanosis  NEURO: alert & oriented x 3 with fluent speech, no focal motor/sensory deficits, gait normal   LABORATORY DATA: CBC    Component Value Date/Time   WBC 5.0 10/22/2011 0933   RBC 3.30* 10/22/2011 0933   HGB 12.3 03/26/2013 1024   HCT 37.0 03/26/2013 1024   PLT 231 10/22/2011 0933   MCV 101.8* 10/22/2011 0933   MCH 33.9 10/22/2011 0933     MCHC 33.3 10/22/2011 0933   RDW 12.0 10/22/2011 0933   LYMPHSABS 0.9 10/22/2011 0933   MONOABS 0.7 10/22/2011 0933   EOSABS 0.5 10/22/2011 0933   BASOSABS 0.0 10/22/2011 0933      Chemistry      Component Value Date/Time   NA 140 03/26/2013 1024   K 3.9 03/26/2013 1024   CL 103 03/26/2013 1024   CO2 29 03/26/2013 1024   BUN 13 03/26/2013 1024   CREATININE 0.56 03/26/2013 1024      Component Value Date/Time   CALCIUM 9.7 03/26/2013 1024   ALKPHOS 46 10/22/2011 0933   AST 15 10/22/2011 0933   ALT 18 10/22/2011 0933   BILITOT 0.3 10/22/2011 0933        ASSESSMENT:  1. 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 HER2 was positive with a ratio of 2.09. Primary was 2.1 cm. She had a 2nd lesion that was 0.4 cm. Underwent mastectomy 11/27/2010. She therefore had a T2 N1 MX cancer. She has had 6 cycles of carboplatin with Taxotere and 52 weeks of Herceptin (finishing on 12/03/2011).  2. Gastroesophageal reflux disease.  3. Bilateral hip replacements with chronic hip discomfort. Her daughter states she also has  a history of spinal stenosis.  4. Deconditioning with very difficult issues relating to walking, cycling, swimming, because of diagnoses #2  5. Chronic constipation  6. 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  7. History of right mastectomy in 2001.    Patient Active Problem List   Diagnosis Date Noted  . Port catheter in place 11/29/2012  . 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 GERD (gastroesophageal reflux disease) 06/10/2011  . Chest pain 06/10/2011     PLAN:  1. I personally reviewed and went over laboratory results with the patient. 2. Labs with next port flush: CBC diff, CMET 3. Influenza vaccine declined 4. Patient education regarding NCCN guidelines 5. Return  in 4 months for follow-up.   THERAPY PLAN:  We will continue to follow NCCN guidelines for surveillance.  NCCN guidelines recommends the following surveillance for invasive breast cancer:  A. History and Physical exam every 4-6 months for 5 years and then every 12 months.  B. Mammography every 12 months  C. Women on Tamoxifen: annual gynecologic assessment every 12 months if uterus is present.  D. Women on aromatase inhibitor or who experience ovarian failure secondary to treatment should have monitoring of bone health with a bone mineral density determination at baseline and periodically thereafter.  E. Assess and encourage adherence to adjuvant endocrine therapy.  F. Evidence suggests that active lifestyle and achieving and maintaining an ideal body weight (20-25 BMI) may lead to optimal breast cancer outcomes.   All questions were answered. The patient knows to call the clinic with any problems, questions or concerns. We can certainly see the patient much sooner if necessary.  Patient and plan discussed with Dr. Alla German and he is in agreement with the aforementioned.   Aayden Cefalu

## 2013-10-22 ENCOUNTER — Encounter (HOSPITAL_COMMUNITY): Payer: Self-pay | Admitting: Oncology

## 2013-10-22 ENCOUNTER — Encounter (HOSPITAL_COMMUNITY): Payer: Medicare Other | Attending: Hematology and Oncology | Admitting: Oncology

## 2013-10-22 VITALS — BP 94/62 | HR 86 | Temp 98.4°F | Resp 16 | Wt 121.4 lb

## 2013-10-22 DIAGNOSIS — K5900 Constipation, unspecified: Secondary | ICD-10-CM

## 2013-10-22 DIAGNOSIS — K219 Gastro-esophageal reflux disease without esophagitis: Secondary | ICD-10-CM

## 2013-10-22 DIAGNOSIS — C50911 Malignant neoplasm of unspecified site of right female breast: Secondary | ICD-10-CM

## 2013-10-22 DIAGNOSIS — C50319 Malignant neoplasm of lower-inner quadrant of unspecified female breast: Secondary | ICD-10-CM

## 2013-10-22 DIAGNOSIS — C773 Secondary and unspecified malignant neoplasm of axilla and upper limb lymph nodes: Secondary | ICD-10-CM

## 2013-10-22 DIAGNOSIS — C50919 Malignant neoplasm of unspecified site of unspecified female breast: Secondary | ICD-10-CM | POA: Insufficient documentation

## 2013-10-22 DIAGNOSIS — Z171 Estrogen receptor negative status [ER-]: Secondary | ICD-10-CM

## 2013-10-22 NOTE — Patient Instructions (Signed)
[facility] Discharge Instructions  RECOMMENDATIONS MADE BY THE CONSULTANT AND ANY TEST RESULTS WILL BE SENT TO YOUR REFERRING PHYSICIAN.  EXAM FINDINGS BY THE PHYSICIAN TODAY AND SIGNS OR SYMPTOMS TO REPORT TO CLINIC OR PRIMARY PHYSICIAN: Exam and findings as discussed by Dellis Anes, PA.  You are doing well. Report any new lumps, bone pain, shortness of breath or other symptoms.  MEDICATIONS PRESCRIBED:  none  INSTRUCTIONS/FOLLOW-UP: Port flush every 6 weeks, blood work with next flush and to be seen in follow-up in 4 months.  Thank you for choosing [facility] to provide your oncology and hematology care.  To afford each patient quality time with our providers, please arrive at least 15 minutes before your scheduled appointment time.  With your help, our goal is to use those 15 minutes to complete the necessary work-up to ensure our physicians have the information they need to help with your evaluation and healthcare recommendations.    Effective January 1st, 2014, we ask that you re-schedule your appointment with our physicians should you arrive 10 or more minutes late for your appointment.  We strive to give you quality time with our providers, and arriving late affects you and other patients whose appointments are after yours.    Again, thank you for choosing [facility].  Our hope is that these requests will decrease the amount of time that you wait before being seen by our physicians.       _____________________________________________________________  Should you have questions after your visit to [facility], please contact our office at [phone] between the hours of 8:30 a.m. and 5:00 p.m.  Voicemails left after 4:30 p.m. will not be returned until the following business day.  For prescription refill requests, have your pharmacy contact our office with your prescription refill request.

## 2013-11-20 ENCOUNTER — Other Ambulatory Visit (HOSPITAL_COMMUNITY): Payer: Self-pay | Admitting: Oncology

## 2013-11-20 DIAGNOSIS — C50919 Malignant neoplasm of unspecified site of unspecified female breast: Secondary | ICD-10-CM

## 2013-11-20 MED ORDER — ESCITALOPRAM OXALATE 20 MG PO TABS: 20.0000 mg | ORAL_TABLET | Freq: Every day | ORAL | Status: DC

## 2013-11-21 ENCOUNTER — Encounter (HOSPITAL_BASED_OUTPATIENT_CLINIC_OR_DEPARTMENT_OTHER): Payer: Medicare Other

## 2013-11-21 DIAGNOSIS — Z452 Encounter for adjustment and management of vascular access device: Secondary | ICD-10-CM

## 2013-11-21 DIAGNOSIS — C50911 Malignant neoplasm of unspecified site of right female breast: Secondary | ICD-10-CM

## 2013-11-21 DIAGNOSIS — C50319 Malignant neoplasm of lower-inner quadrant of unspecified female breast: Secondary | ICD-10-CM

## 2013-11-21 LAB — CBC WITH DIFFERENTIAL/PLATELET
Basophils Absolute: 0 10*3/uL (ref 0.0–0.1)
Basophils Relative: 1 % (ref 0–1)
Eosinophils Absolute: 0.2 10*3/uL (ref 0.0–0.7)
Hemoglobin: 12.3 g/dL (ref 12.0–15.0)
MCHC: 33.7 g/dL (ref 30.0–36.0)
Neutro Abs: 2.8 10*3/uL (ref 1.7–7.7)
Neutrophils Relative %: 56 % (ref 43–77)
Platelets: 254 10*3/uL (ref 150–400)
RDW: 12.1 % (ref 11.5–15.5)

## 2013-11-21 LAB — COMPREHENSIVE METABOLIC PANEL
AST: 14 U/L (ref 0–37)
Albumin: 3.6 g/dL (ref 3.5–5.2)
Alkaline Phosphatase: 49 U/L (ref 39–117)
Chloride: 102 mEq/L (ref 96–112)
Potassium: 4.2 mEq/L (ref 3.7–5.3)
Total Bilirubin: 0.4 mg/dL (ref 0.3–1.2)
Total Protein: 6.8 g/dL (ref 6.0–8.3)

## 2013-11-21 MED ORDER — HEPARIN SOD (PORK) LOCK FLUSH 100 UNIT/ML IV SOLN
500.0000 [IU] | Freq: Once | INTRAVENOUS | Status: AC
  Administered 2013-11-21: 500 [IU] via INTRAVENOUS

## 2013-11-21 MED ORDER — SODIUM CHLORIDE 0.9 % IJ SOLN
10.0000 mL | INTRAMUSCULAR | Status: DC | PRN
  Administered 2013-11-21: 10 mL via INTRAVENOUS

## 2013-11-21 MED ORDER — HEPARIN SOD (PORK) LOCK FLUSH 100 UNIT/ML IV SOLN
INTRAVENOUS | Status: AC
  Filled 2013-11-21: qty 5

## 2013-11-21 NOTE — Progress Notes (Signed)
Roberta Gonzalez presented for Portacath access and flush. Portacath located right  chest wall accessed with  H 20 needle. Good blood return present. Portacath flushed with 20ml NS and 500U/5ml Heparin and needle removed intact. Procedure without incident. Patient tolerated procedure well.   

## 2014-01-02 ENCOUNTER — Encounter (HOSPITAL_COMMUNITY): Payer: Medicare Other

## 2014-01-09 ENCOUNTER — Encounter (HOSPITAL_COMMUNITY): Payer: Medicare Other

## 2014-01-14 ENCOUNTER — Encounter (HOSPITAL_COMMUNITY): Payer: Medicare Other | Attending: Hematology and Oncology

## 2014-01-14 ENCOUNTER — Other Ambulatory Visit (HOSPITAL_COMMUNITY): Payer: Self-pay | Admitting: Family Medicine

## 2014-01-14 DIAGNOSIS — M545 Low back pain, unspecified: Secondary | ICD-10-CM

## 2014-01-14 DIAGNOSIS — C50919 Malignant neoplasm of unspecified site of unspecified female breast: Secondary | ICD-10-CM | POA: Insufficient documentation

## 2014-01-14 DIAGNOSIS — Z452 Encounter for adjustment and management of vascular access device: Secondary | ICD-10-CM

## 2014-01-14 DIAGNOSIS — C773 Secondary and unspecified malignant neoplasm of axilla and upper limb lymph nodes: Secondary | ICD-10-CM

## 2014-01-14 DIAGNOSIS — C50319 Malignant neoplasm of lower-inner quadrant of unspecified female breast: Secondary | ICD-10-CM

## 2014-01-14 MED ORDER — HEPARIN SOD (PORK) LOCK FLUSH 100 UNIT/ML IV SOLN
INTRAVENOUS | Status: AC
  Filled 2014-01-14: qty 5

## 2014-01-14 MED ORDER — SODIUM CHLORIDE 0.9 % IJ SOLN
10.0000 mL | Freq: Once | INTRAMUSCULAR | Status: AC
Start: 2014-01-14 — End: 2014-01-14
  Administered 2014-01-14: 10 mL via INTRAVENOUS

## 2014-01-14 MED ORDER — HEPARIN SOD (PORK) LOCK FLUSH 100 UNIT/ML IV SOLN
500.0000 [IU] | Freq: Once | INTRAVENOUS | Status: AC
Start: 2014-01-14 — End: 2014-01-14
  Administered 2014-01-14: 500 [IU] via INTRAVENOUS

## 2014-01-14 NOTE — Progress Notes (Signed)
Roberta Gonzalez presented for Portacath access and flush.  Portacath located right chest wall accessed with  H 20 needle.  Good blood return present. Portacath flushed with 50ml NS and 500U/58ml Heparin and needle removed intact.  Procedure tolerated well and without incident.

## 2014-01-16 ENCOUNTER — Ambulatory Visit (HOSPITAL_COMMUNITY): Payer: Medicare Other

## 2014-01-22 ENCOUNTER — Ambulatory Visit (HOSPITAL_COMMUNITY)
Admission: RE | Admit: 2014-01-22 | Discharge: 2014-01-22 | Disposition: A | Discharge status: Home / Self Care | Payer: Medicare Other | Source: Ambulatory Visit | Attending: Family Medicine | Admitting: Family Medicine

## 2014-01-22 ENCOUNTER — Other Ambulatory Visit (HOSPITAL_COMMUNITY): Payer: Self-pay | Admitting: Family Medicine

## 2014-01-22 DIAGNOSIS — M545 Low back pain, unspecified: Secondary | ICD-10-CM

## 2014-01-22 DIAGNOSIS — T148XXA Other injury of unspecified body region, initial encounter: Secondary | ICD-10-CM

## 2014-01-22 DIAGNOSIS — M79609 Pain in unspecified limb: Secondary | ICD-10-CM | POA: Insufficient documentation

## 2014-01-22 DIAGNOSIS — M259 Joint disorder, unspecified: Secondary | ICD-10-CM | POA: Insufficient documentation

## 2014-01-22 DIAGNOSIS — M47817 Spondylosis without myelopathy or radiculopathy, lumbosacral region: Secondary | ICD-10-CM | POA: Insufficient documentation

## 2014-01-23 ENCOUNTER — Other Ambulatory Visit (HOSPITAL_COMMUNITY): Payer: Self-pay | Admitting: Oncology

## 2014-01-23 DIAGNOSIS — K219 Gastro-esophageal reflux disease without esophagitis: Secondary | ICD-10-CM

## 2014-01-23 MED ORDER — SUCRALFATE 1 GM/10ML PO SUSP: 1.0000 g | Freq: Every day | ORAL | Status: DC | PRN

## 2014-02-19 NOTE — Progress Notes (Signed)
Roberta Curet, MD [place] [address]  Invasive ductal carcinoma of breast - Plan: heparin lock flush 100 unit/mL, sodium chloride 0.9 % injection 10 mL, Vitamin D 25 hydroxy, Vitamin B12, Vitamin D 25 hydroxy, Vitamin B12  Other malaise and fatigue - Plan: TSH, Vitamin D 25 hydroxy, Vitamin B12, T4, TSH, Vitamin D 25 hydroxy, Vitamin B12, T4  Other and unspecified hyperlipidemia - Plan: Lipid panel, Lipid panel  Anemia, unspecified - Plan: CBC with Differential, Vitamin D 25 hydroxy, Vitamin B12, CBC with Differential, Vitamin D 25 hydroxy, Vitamin B12  Unspecified essential hypertension - Plan: Comprehensive metabolic panel, Comprehensive metabolic panel  Preventative health care - Plan: Vitamin D 25 hydroxy, Vitamin B12, Vitamin D 25 hydroxy, Vitamin B12  CURRENT THERAPY: Surveillance per NCCN guidelines  INTERVAL HISTORY: Roberta Gonzalez 73 y.o. 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 HER2 was positive with a ratio of 2.09. Primary was 2.1 cm. She had a 2nd lesion that was 0.4 cm. Underwent mastectomy 11/27/2010. She therefore had a T2 N1 MX cancer. She has had 6 cycles of carboplatin with Taxotere and 52 weeks of Herceptin (finishing on 12/03/2011).     Invasive ductal carcinoma of breast   10/28/2010 Initial Diagnosis Breast, left, needle core biopsy, :  - INVASIVE DUCTAL CARCINOMA.   11/27/2010 Surgery Left simple mastectomy: Multicentric invasive ductal cancer, 2.1 and 0.4 cm, +LVI, 2/8 positive lymph nodes.  ER-, PR-, HER2 3+, Ki-67 82%.   12/07/2010 - 05/03/2011 Chemotherapy Carboplatin/Taxotere x 6 cycles (Approximate dates)   12/07/2010 - 12/03/2011 Antibody Plan 52 weeks of Herceptin (Aproximate start date)   05/05/2011 Remission      I  personally reviewed and went over laboratory results with the patient.  The results are noted within this dictation.  I personally reviewed and went over radiographic studies with the patient.  The results are noted within this dictation.    NCCN guidelines recommends the following surveillance for invasive breast cancer:  A. History and Physical exam every 4-6 months for 5 years and then every 12 months.  B. Mammography every 12 months  C. Women on Tamoxifen: annual gynecologic assessment every 12 months if uterus is present.  D. Women on aromatase inhibitor or who experience ovarian failure secondary to treatment should have monitoring of bone health with a bone mineral density determination at baseline and periodically thereafter.  E. Assess and encourage adherence to adjuvant endocrine therapy.  F. Evidence suggests that active lifestyle and achieving and maintaining an ideal body weight (20-25 BMI) may lead to optimal breast cancer outcomes.  Her only complaint today is a cough that occurs in an episodic fashion.  She denies any sputum production.  She admits to a post-nasal drip.  She is using Allegra daily.  Occasionally, she blows yellow phlegm from her nostrils.  I provided the patient education regarding allergic rhinitis.  I recommend addition of Benadryl 25 mg at HS and OTC Flonase as directed.  I have offered to have her port removed.  She is fearful of general anesthesia, but port removal can be performed without general anesthesia.  She is educated about this.  For now, she wishes to maintain her port, which is reasonable.   She reports that she is due to  have labs by Dr. [name] today or tomorrow.  Since we are flushing her port, we will perform labs today according to his orders to save her from two separate charges and two needle sticks.   She is agreeable to this.  I recommended that the patient undergo colonoscopy by Dr. Laural Golden.  She notes that this was scheduled, but she  needed to cancel it due to inclement weather this year.  She should have this rescheduled.   Oncologically, she denies any complaints and ROS questioning is negative.   Past Medical History  Diagnosis Date  . GERD (gastroesophageal reflux disease) 06/10/2011  . Chest pain 06/10/2011  . Port catheter in place 11/29/2012  . Cancer     jan 2012/mastectomy L; chemo/rad tx  . 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 Hypercholesteremia   . Depression   . Neuropathy   . Breast cancer     right in 2000 in [place] and underwent a mastectomy.    has Invasive ductal carcinoma of breast; GERD (gastroesophageal reflux disease); Chest pain; and Port catheter in place on her problem list.     is allergic to codeine; percodan; and sulfa antibiotics.  Roberta Gonzalez had no medications administered during this visit.  Past Surgical History  Procedure Laterality Date  . Mastectomy    . Joint replacement    . Hip replacements    . Cranial shunt    . Abdominal hysterectomy    . Tonsillectomy    . Cataract extraction w/phaco Right 04/02/2013    Procedure: CATARACT EXTRACTION PHACO AND INTRAOCULAR LENS PLACEMENT (IOC);  Surgeon: [name], MD;  Location: [facility];  Service: Ophthalmology;  Laterality: Right;  CDE 22.44  . Cataract extraction w/phaco Left 04/26/2013    Procedure: CATARACT EXTRACTION PHACO AND INTRAOCULAR LENS PLACEMENT (IOC);  Surgeon: [name], MD;  Location: [facility];  Service: Ophthalmology;  Laterality: Left;  CDE: 23.60  . Esophagogastroduodenoscopy (egd) with esophageal dilation N/A 05/03/2013    Procedure: ESOPHAGOGASTRODUODENOSCOPY (EGD) WITH ESOPHAGEAL DILATION;  Surgeon: [name], MD;  Location: [facility];  Service: Endoscopy;  Laterality: N/A;  145-moved to 100 Ann to notify pt    Denies any headaches, dizziness, double vision, fevers, chills, night sweats, nausea, vomiting, diarrhea, constipation, chest pain, heart palpitations, shortness of breath, blood in  stool, black tarry stool, urinary pain, urinary burning, urinary frequency, hematuria.   PHYSICAL EXAMINATION  ECOG PERFORMANCE STATUS: 0 - Asymptomatic  Filed Vitals:   02/20/14 0852  BP: 104/48  Pulse: 76  Temp: 98.2 F (36.8 C)  Resp: 16    GENERAL:alert, healthy, no distress, well nourished, well developed, comfortable, cooperative and smiling SKIN: skin color, texture, turgor are normal, no rashes or significant lesions HEAD: Normocephalic, No masses, lesions, tenderness or abnormalities.  Left frontal sinus tenderness on palpation. EYES: normal, PERRLA, EOMI, Conjunctiva are pink and non-injected EARS: External ears normal OROPHARYNX:lips, buccal mucosa, and tongue normal, mucous membranes are moist and mild erythema in the posterior pharynx with signs of post-nasal drip  NECK: supple, no adenopathy, thyroid normal size, non-tender, without nodularity, no stridor, non-tender, trachea midline LYMPH:  no palpable lymphadenopathy BREAST:not examined LUNGS: clear to auscultation and percussion HEART: regular rate & rhythm, no murmurs, no gallops, S1 normal and S2 normal ABDOMEN:normal bowel sounds BACK: Back symmetric, no curvature. EXTREMITIES:less then 2 second capillary refill, no joint deformities, effusion, or inflammation, no edema, no skin discoloration, no clubbing, no cyanosis  NEURO: alert & oriented x 3 with  fluent speech, no focal motor/sensory deficits, gait normal    LABORATORY DATA: CBC    Component Value Date/Time   WBC 4.9 11/21/2013 1105   RBC 3.73* 11/21/2013 1105   HGB 12.3 11/21/2013 1105   HCT 36.5 11/21/2013 1105   PLT 254 11/21/2013 1105   MCV 97.9 11/21/2013 1105   MCH 33.0 11/21/2013 1105   MCHC 33.7 11/21/2013 1105   RDW 12.1 11/21/2013 1105   LYMPHSABS 1.5 11/21/2013 1105   MONOABS 0.5 11/21/2013 1105   EOSABS 0.2 11/21/2013 1105   BASOSABS 0.0 11/21/2013 1105      Chemistry      Component Value Date/Time   NA 140 11/21/2013 1105    K 4.2 11/21/2013 1105   CL 102 11/21/2013 1105   CO2 25 11/21/2013 1105   BUN 16 11/21/2013 1105   CREATININE 0.53 11/21/2013 1105      Component Value Date/Time   CALCIUM 9.4 11/21/2013 1105   ALKPHOS 49 11/21/2013 1105   AST 14 11/21/2013 1105   ALT 13 11/21/2013 1105   BILITOT 0.4 11/21/2013 1105      RADIOLOGY:  01/22/2014  CLINICAL DATA: Low back pain  EXAM:  CT LUMBAR SPINE WITHOUT CONTRAST  TECHNIQUE:  Multidetector CT imaging of the lumbar spine was performed without  intravenous contrast administration. Multiplanar CT image  reconstructions were also generated.  COMPARISON: CT 09/16/2011  FINDINGS:  There is moderate dextroscoliosis at L1-2. This is unchanged.  Negative for fracture or mass. Spinous processes are touching with  degenerative change from L2 and S1. 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 This is relatively mild.  L1-2: Disc degeneration and left-sided spurring is unchanged. No  significant stenosis.  L2-3: Mild degenerative change  L3-4: Mild degenerative change  L4-5: Mild degenerative change without stenosis  L5-S1: Disc degeneration and mild spurring on the right, unchanged  from the prior study. Mild right foraminal encroachment.  IMPRESSION:  Mild degenerative changes are present, unchanged from 2012. No new  findings.  Electronically Signed  By: Franchot Gallo M.D.  On: 01/22/2014 10:52   01/22/2014  CLINICAL DATA: Left foot pain and burning  EXAM:  LEFT FOOT - COMPLETE 3+ VIEW  COMPARISON: DG FOOT COMPLETE*L* dated 02/13/2013  FINDINGS:  Diffuse osteopenia. There are healed fractures of the necks of the  second and third metatarsals. There is mild degenerative change of  the first metatarsophalangeal joint. This is increased in severity  when compared to the prior study, with some erosive change and soft  tissue swelling along the medial aspect of the joint. There is  limited evaluation of the fifth toe as it  overlaps the fourth toe on  both the frontal and oblique images. On the lateral view, the  phalanges appear intact.  IMPRESSION:  1. Healed fractures of the second and third metatarsals  2. Very limited evaluation of the fifth toe due to positioning as  described above. Allowing for this, no significant abnormalities  appreciated.  3. Interval increase and degenerative change, still mild, involving  the first metatarsophalangeal joint. There appears to be some  erosive change and soft tissue swelling. The findings suggest the  possibility of gouty arthritis.  Electronically Signed  By: Skipper Cliche M.D.  On: 01/22/2014 10:32     ASSESSMENT:  1. 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 HER2 was positive with a ratio of 2.09. Primary was 2.1 cm. She had a 2nd lesion that was 0.4 cm. Underwent mastectomy 11/27/2010. She therefore had a T2 N1 MX cancer. She has had 6 cycles of carboplatin with Taxotere and 52 weeks of Herceptin (finishing on 12/03/2011).  2. Gastroesophageal reflux disease.  3. Bilateral hip replacements with chronic hip discomfort. Her daughter states she also has a history of spinal stenosis.  4. Chronic constipation  5. 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  6. History of right mastectomy in 2001.  7. Allergic rhinitis with post-nasal drip.  Taking Allegra daily.  Recommend adding 25 mg of Benadryl at HS and OTC Flonase as directed.  Patient Active Problem List   Diagnosis Date Noted  . Port catheter in place 11/29/2012  . 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 GERD (gastroesophageal reflux disease) 06/10/2011  . Chest pain 06/10/2011    PLAN:  1. I personally reviewed and went over laboratory results with the patient.  The results are noted within  this dictation. 2. I personally reviewed and went over radiographic studies with the patient.  The results are noted within this dictation.   3. No role for labs in 6 months 4. Labs today per PCP and we will fax Dr. [name] the results: CBC diff, CMET, Lipid panel, TSH and T4, Vit D level, and Vitamin B12 level. 5. Recommend addition of Benadryl 25 mg at HS for allergic rhinitis. 6. Continue with Allegra daily 7. Recommend OTC Flonase as well. 8. Recommend Colonoscopy by Dr. Laural Golden in the near future. 9. Return in 6 months   THERAPY PLAN:  From an oncology standpoint, she denies any complaints.  We will follow NCCN guidelines pertaining to surveillance. NCCN guidelines recommends the following surveillance for invasive breast cancer:  A. History and Physical exam every 4-6 months for 5 years and then every 12 months.  B. Mammography every 12 months  C. Women on Tamoxifen: annual gynecologic assessment every 12 months if uterus is present.  D. Women on aromatase inhibitor or who experience ovarian failure secondary to treatment should have monitoring of bone health with a bone mineral density determination at baseline and periodically thereafter.  E. Assess and encourage adherence to adjuvant endocrine therapy.  F. Evidence suggests that active lifestyle and achieving and maintaining an ideal body weight (20-25 BMI) may lead to optimal breast cancer outcomes.  All questions were answered. The patient knows to call the clinic with any problems, questions or concerns. We can certainly see the patient much sooner if necessary.  Patient and plan discussed with Dr. Farrel Gobble and he is in agreement with the aforementioned.   Alize Acy 02/20/2014

## 2014-02-20 ENCOUNTER — Encounter (HOSPITAL_COMMUNITY): Payer: Medicare Other | Attending: Hematology and Oncology | Admitting: Oncology

## 2014-02-20 ENCOUNTER — Encounter (HOSPITAL_COMMUNITY): Payer: Self-pay | Admitting: Oncology

## 2014-02-20 VITALS — BP 104/48 | HR 76 | Temp 98.2°F | Resp 16 | Wt 123.6 lb

## 2014-02-20 DIAGNOSIS — C50919 Malignant neoplasm of unspecified site of unspecified female breast: Secondary | ICD-10-CM

## 2014-02-20 DIAGNOSIS — D649 Anemia, unspecified: Secondary | ICD-10-CM

## 2014-02-20 DIAGNOSIS — Z9010 Acquired absence of unspecified breast and nipple: Secondary | ICD-10-CM

## 2014-02-20 DIAGNOSIS — Z0000 Encounter for general adult medical examination without abnormal findings: Secondary | ICD-10-CM

## 2014-02-20 DIAGNOSIS — R5381 Other malaise: Secondary | ICD-10-CM | POA: Insufficient documentation

## 2014-02-20 DIAGNOSIS — E785 Hyperlipidemia, unspecified: Secondary | ICD-10-CM

## 2014-02-20 DIAGNOSIS — Z171 Estrogen receptor negative status [ER-]: Secondary | ICD-10-CM

## 2014-02-20 DIAGNOSIS — R5383 Other fatigue: Secondary | ICD-10-CM

## 2014-02-20 DIAGNOSIS — Z853 Personal history of malignant neoplasm of breast: Secondary | ICD-10-CM

## 2014-02-20 DIAGNOSIS — I10 Essential (primary) hypertension: Secondary | ICD-10-CM

## 2014-02-20 DIAGNOSIS — J309 Allergic rhinitis, unspecified: Secondary | ICD-10-CM

## 2014-02-20 LAB — CBC WITH DIFFERENTIAL/PLATELET
BASOS ABS: 0 10*3/uL (ref 0.0–0.1)
BASOS PCT: 1 % (ref 0–1)
Eosinophils Absolute: 0.2 10*3/uL (ref 0.0–0.7)
Eosinophils Relative: 4 % (ref 0–5)
HCT: 37 % (ref 36.0–46.0)
HEMOGLOBIN: 12.1 g/dL (ref 12.0–15.0)
Lymphocytes Relative: 27 % (ref 12–46)
Lymphs Abs: 1.4 10*3/uL (ref 0.7–4.0)
MCH: 32.7 pg (ref 26.0–34.0)
MCHC: 32.7 g/dL (ref 30.0–36.0)
MCV: 100 fL (ref 78.0–100.0)
MONOS PCT: 12 % (ref 3–12)
Monocytes Absolute: 0.6 10*3/uL (ref 0.1–1.0)
NEUTROS ABS: 3 10*3/uL (ref 1.7–7.7)
NEUTROS PCT: 56 % (ref 43–77)
Platelets: 254 10*3/uL (ref 150–400)
RBC: 3.7 MIL/uL — AB (ref 3.87–5.11)
RDW: 11.9 % (ref 11.5–15.5)
WBC: 5.3 10*3/uL (ref 4.0–10.5)

## 2014-02-20 LAB — COMPREHENSIVE METABOLIC PANEL
ALBUMIN: 3.9 g/dL (ref 3.5–5.2)
ALT: 12 U/L (ref 0–35)
AST: 14 U/L (ref 0–37)
Alkaline Phosphatase: 48 U/L (ref 39–117)
BILIRUBIN TOTAL: 0.5 mg/dL (ref 0.3–1.2)
BUN: 12 mg/dL (ref 6–23)
CHLORIDE: 105 meq/L (ref 96–112)
CO2: 28 mEq/L (ref 19–32)
Calcium: 9.4 mg/dL (ref 8.4–10.5)
Creatinine, Ser: 0.49 mg/dL — ABNORMAL LOW (ref 0.50–1.10)
GFR calc Af Amer: >90 mL/min (ref >90)
GFR calc non Af Amer: >90 mL/min (ref >90)
Glucose, Bld: 95 mg/dL (ref 70–99)
Potassium: 4 mEq/L (ref 3.7–5.3)
Sodium: 143 mEq/L (ref 137–147)
Total Protein: 6.9 g/dL (ref 6.0–8.3)

## 2014-02-20 LAB — LIPID PANEL
CHOL/HDL RATIO: 2.2 ratio
CHOLESTEROL: 128 mg/dL (ref 0–200)
HDL: 57 mg/dL (ref >39)
LDL Cholesterol: 50 mg/dL (ref 0–99)
TRIGLYCERIDES: 107 mg/dL (ref <150)
VLDL: 21 mg/dL (ref 0–40)

## 2014-02-20 LAB — T4: T4, Total: 8.1 ug/dL (ref 5.0–12.5)

## 2014-02-20 LAB — VITAMIN B12: Vitamin B-12: 345 pg/mL (ref 211–911)

## 2014-02-20 LAB — TSH: TSH: 0.723 u[IU]/mL (ref 0.350–4.500)

## 2014-02-20 MED ORDER — SODIUM CHLORIDE 0.9 % IJ SOLN
10.0000 mL | INTRAMUSCULAR | Status: DC | PRN
  Administered 2014-02-20: 10 mL via INTRAVENOUS

## 2014-02-20 MED ORDER — HEPARIN SOD (PORK) LOCK FLUSH 100 UNIT/ML IV SOLN
500.0000 [IU] | Freq: Once | INTRAVENOUS | Status: AC
  Administered 2014-02-20: 500 [IU] via INTRAVENOUS
  Filled 2014-02-20: qty 5

## 2014-02-20 NOTE — Progress Notes (Signed)
Roberta Gonzalez presented for Portacath access and flush. Proper placement of portacath confirmed by CXR. Portacath located right chest wall accessed with  H 20 needle. Good blood return present. Portacath flushed with 20ml NS and 500U/5ml Heparin and needle removed intact. Procedure without incident. Patient tolerated procedure well.   

## 2014-02-20 NOTE — Patient Instructions (Addendum)
[place] Discharge Instructions  RECOMMENDATIONS MADE BY THE CONSULTANT AND ANY TEST RESULTS WILL BE SENT TO YOUR REFERRING PHYSICIAN.  EXAM FINDINGS BY THE PHYSICIAN TODAY AND SIGNS OR SYMPTOMS TO REPORT TO CLINIC OR PRIMARY PHYSICIAN: Exam and findings as discussed by Robynn Pane, PA-C.  You are doing well. Report any new lumps, bone pain, shortness of breath or other symptoms.  See Dr. Laural Golden for your colonoscopy.  MEDICATIONS PRESCRIBED:  Take benadryl at bedtime Use over the counter flonase nasal spray.  INSTRUCTIONS/FOLLOW-UP: Port flushes every 6 months and office visit in 6 months.  Thank you for choosing [facility] to provide your oncology and hematology care.  To afford each patient quality time with our providers, please arrive at least 15 minutes before your scheduled appointment time.  With your help, our goal is to use those 15 minutes to complete the necessary work-up to ensure our physicians have the information they need to help with your evaluation and healthcare recommendations.    Effective January 1st, 2014, we ask that you re-schedule your appointment with our physicians should you arrive 10 or more minutes late for your appointment.  We strive to give you quality time with our providers, and arriving late affects you and other patients whose appointments are after yours.    Again, thank you for choosing [facility].  Our hope is that these requests will decrease the amount of time that you wait before being seen by our physicians.       _____________________________________________________________  Should you have questions after your visit to [facility], please contact our office at [phone] between the hours of 8:30 a.m. and 5:00 p.m.  Voicemails left after 4:30 p.m. will not be returned until the following business day.  For prescription refill requests, have your pharmacy contact our office with  your prescription refill request.

## 2014-02-21 LAB — VITAMIN D 25 HYDROXY (VIT D DEFICIENCY, FRACTURES): Vit D, 25-Hydroxy: 68 ng/mL (ref 30–89)

## 2014-03-13 ENCOUNTER — Telehealth (INDEPENDENT_AMBULATORY_CARE_PROVIDER_SITE_OTHER): Payer: Self-pay | Admitting: *Deleted

## 2014-03-13 ENCOUNTER — Other Ambulatory Visit (INDEPENDENT_AMBULATORY_CARE_PROVIDER_SITE_OTHER): Payer: Self-pay | Admitting: *Deleted

## 2014-03-13 ENCOUNTER — Ambulatory Visit (INDEPENDENT_AMBULATORY_CARE_PROVIDER_SITE_OTHER): Payer: Medicare Other | Admitting: Internal Medicine

## 2014-03-13 ENCOUNTER — Encounter (INDEPENDENT_AMBULATORY_CARE_PROVIDER_SITE_OTHER): Payer: Self-pay | Admitting: Internal Medicine

## 2014-03-13 VITALS — BP 100/46 | HR 60 | Temp 98.2°F | Ht 63.0 in | Wt 122.1 lb

## 2014-03-13 DIAGNOSIS — Z860100 Personal history of colon polyps, unspecified: Secondary | ICD-10-CM | POA: Insufficient documentation

## 2014-03-13 DIAGNOSIS — Z800 Family history of malignant neoplasm of digestive organs: Secondary | ICD-10-CM | POA: Insufficient documentation

## 2014-03-13 DIAGNOSIS — Z1211 Encounter for screening for malignant neoplasm of colon: Secondary | ICD-10-CM

## 2014-03-13 MED ORDER — PEG-KCL-NACL-NASULF-NA ASC-C 100 G PO SOLR: 1.0000 | Freq: Once | ORAL | Status: DC

## 2014-03-13 NOTE — Telephone Encounter (Signed)
Patient needs movi prep 

## 2014-03-13 NOTE — Patient Instructions (Signed)
Colonoscopy.  The risks and benefits such as perforation, bleeding, and infection were reviewed with the patient and is agreeable. 

## 2014-03-13 NOTE — Progress Notes (Signed)
Subjective:     Patient ID: Roberta Gonzalez, female   DOB: Mar 05, 1941, 73 y.o.   MRN: [id number]  HPI Presents today with a request for a colonoscopy. She tells me she has a hx of colon polyps. Her last colonoscopy was greater than 20 yrs ago in [place]. Appetite is good. No weight loss. Occasionally dysphagia because she is unable to chew (endentulous). There is no abdominal pain. She usually has a BM three times a week. She saw one speck of blood last week because she strained.  Hx of bilateral breast cancer. First 2000, and 2nd 2011 with left breast cancer. Followed by  Hazel Sams alas at the [facility].    05/03/2013 EGD/ED: dysphagia: Dr. Laural Golden: Impression:  Small sliding hiatal hernia with incomplete ring at GEJ.  Esophagus dilated by passing 48, 52 and 54 French Maloney dilators but no mucosal disruption noted.  Biopsy taken from mucosa at esophageal body for routine histology     Review of Systems Past Medical History  Diagnosis Date  . GERD (gastroesophageal reflux disease) 06/10/2011  . Chest pain 06/10/2011  . Port catheter in place 11/29/2012  . Cancer     jan 2012/mastectomy L; chemo/rad tx  . 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 Hypercholesteremia   . Depression   . Neuropathy   . Breast cancer     right in 2000 in [place] and underwent a mastectomy.    Past Surgical History  Procedure Laterality Date  . Mastectomy    . Joint replacement    . Hip replacements    . Cranial shunt    . Abdominal hysterectomy    . Tonsillectomy    . Cataract extraction w/phaco Right 04/02/2013    Procedure: CATARACT EXTRACTION PHACO AND INTRAOCULAR LENS PLACEMENT (IOC);  Surgeon: [name], MD;  Location: [facility];  Service: Ophthalmology;  Laterality: Right;  CDE 22.44  . Cataract extraction w/phaco Left 04/26/2013    Procedure: CATARACT EXTRACTION PHACO AND INTRAOCULAR LENS PLACEMENT (IOC);  Surgeon: [name], MD;  Location: [facility];  Service:  Ophthalmology;  Laterality: Left;  CDE: 23.60  . Esophagogastroduodenoscopy (egd) with esophageal dilation N/A 05/03/2013    Procedure: ESOPHAGOGASTRODUODENOSCOPY (EGD) WITH ESOPHAGEAL DILATION;  Surgeon: [name], MD;  Location: [facility];  Service: Endoscopy;  Laterality: N/A;  145-moved to 100 Ann to notify pt    Allergies  Allergen Reactions  . Codeine Other (See Comments)    Makes pt nervous  . Percodan [Oxycodone-Aspirin] Other (See Comments)    Mood change (climbing the walls, angry, irritable)  . Sulfa Antibiotics Nausea Only    Current Outpatient Prescriptions on File Prior to Visit  Medication Sig Dispense Refill  . alendronate (FOSAMAX) 70 MG tablet Take 70 mg by mouth every 7 (seven) days. Take with a full glass of water on an empty stomach.      . Alum & Mag Hydroxide-Simeth (GI COCKTAIL) SUSP suspension Take 30 mLs by mouth daily as needed (stomach). Shake well.      . cholecalciferol (VITAMIN D) 400 UNITS TABS Take 1,000 Units by mouth daily.       Marland Kitchen Dexlansoprazole 30 MG capsule Take 60 mg by mouth daily.       Marland Kitchen escitalopram (LEXAPRO) 20 MG tablet Take 1 tablet (20 mg total) by mouth daily.  30 tablet  5  . fexofenadine (ALLEGRA) 180 MG tablet Take 180 mg by mouth daily.      Marland Kitchen  fish oil-omega-3 fatty acids 1000 MG capsule Take 1 g by mouth 2 (two) times daily.       Marland Kitchen HYDROcodone-acetaminophen (NORCO/VICODIN) 5-325 MG per tablet Take 1 tablet by mouth every 6 (six) hours as needed for pain.      Marland Kitchen ibuprofen (ADVIL,MOTRIN) 200 MG tablet Take 200 mg by mouth every 6 (six) hours as needed for pain.       . naproxen sodium (ALEVE) 220 MG tablet Take 220 mg by mouth 2 (two) times daily as needed (Pain).      Marland Kitchen OVER THE COUNTER MEDICATION Place 1 drop into the left eye 2 (two) times daily. OTC Allergy eye drop      . OVER THE COUNTER MEDICATION Place 1 drop into the left eye daily. "Restore eye drops"      . pravastatin (PRAVACHOL) 40 MG tablet Take 40 mg by mouth  daily.      . Prenatal Vit-Fe Psac Cmplx-FA (PRENATAL MULTIVITAMIN) 60-1 MG tablet Take 1 tablet by mouth daily with breakfast.       . sucralfate (CARAFATE) 1 GM/10ML suspension Take 10 mLs (1 g total) by mouth daily as needed.  420 mL  3   No current facility-administered medications on file prior to visit.  Widowed , 4 children. One daughter age 11 with hx of colon cancer. Doing well      Objective:   Physical Exam  Filed Vitals:   03/13/14 1035  BP: 100/46  Pulse: 60  Temp: 98.2 F (36.8 C)  Height: 5\' 3"  (1.6 m)  Weight: 122 lb 1.6 oz (55.384 kg)  Alert and oriented. Skin warm and dry. Oral mucosa is moist.   . Sclera anicteric, conjunctivae is pink. Thyroid not enlarged. No cervical lymphadenopathy. Lungs clear. Heart regular rate and rhythm.  Abdomen is soft. Bowel sounds are positive. No hepatomegaly. No abdominal masses felt. No tenderness.  No edema to lower extremities.        Assessment:    Family hx of colon cancer in a daughter. No GI symptoms. Requesting a surveillance colonoscopy for hx of colon polyps.       Plan:    Colonoscopy with Dr Laural Golden. The risks and benefits such as perforation, bleeding, and infection were reviewed with the patient and is agreeable.

## 2014-03-22 ENCOUNTER — Encounter (HOSPITAL_COMMUNITY): Payer: Self-pay | Admitting: Pharmacy Technician

## 2014-03-29 ENCOUNTER — Emergency Department (HOSPITAL_COMMUNITY): Payer: Medicare Other

## 2014-03-29 ENCOUNTER — Emergency Department (HOSPITAL_COMMUNITY)
Admission: EM | Admit: 2014-03-29 | Discharge: 2014-03-29 | Disposition: A | Discharge status: Home / Self Care | Payer: Medicare Other | Attending: Emergency Medicine | Admitting: Emergency Medicine

## 2014-03-29 ENCOUNTER — Encounter (HOSPITAL_COMMUNITY): Payer: Self-pay | Admitting: Emergency Medicine

## 2014-03-29 DIAGNOSIS — F3289 Other specified depressive episodes: Secondary | ICD-10-CM | POA: Insufficient documentation

## 2014-03-29 DIAGNOSIS — Z8669 Personal history of other diseases of the nervous system and sense organs: Secondary | ICD-10-CM | POA: Insufficient documentation

## 2014-03-29 DIAGNOSIS — Z853 Personal history of malignant neoplasm of breast: Secondary | ICD-10-CM | POA: Insufficient documentation

## 2014-03-29 DIAGNOSIS — F329 Major depressive disorder, single episode, unspecified: Secondary | ICD-10-CM | POA: Insufficient documentation

## 2014-03-29 DIAGNOSIS — Z87891 Personal history of nicotine dependence: Secondary | ICD-10-CM | POA: Insufficient documentation

## 2014-03-29 DIAGNOSIS — Z79899 Other long term (current) drug therapy: Secondary | ICD-10-CM | POA: Insufficient documentation

## 2014-03-29 DIAGNOSIS — W010XXA Fall on same level from slipping, tripping and stumbling without subsequent striking against object, initial encounter: Secondary | ICD-10-CM | POA: Insufficient documentation

## 2014-03-29 DIAGNOSIS — S52599A Other fractures of lower end of unspecified radius, initial encounter for closed fracture: Secondary | ICD-10-CM | POA: Insufficient documentation

## 2014-03-29 DIAGNOSIS — E7800 Pure hypercholesterolemia, unspecified: Secondary | ICD-10-CM | POA: Insufficient documentation

## 2014-03-29 DIAGNOSIS — IMO0002 Reserved for concepts with insufficient information to code with codable children: Secondary | ICD-10-CM | POA: Insufficient documentation

## 2014-03-29 DIAGNOSIS — S62101A Fracture of unspecified carpal bone, right wrist, initial encounter for closed fracture: Secondary | ICD-10-CM

## 2014-03-29 DIAGNOSIS — Y9389 Activity, other specified: Secondary | ICD-10-CM | POA: Insufficient documentation

## 2014-03-29 DIAGNOSIS — Y929 Unspecified place or not applicable: Secondary | ICD-10-CM | POA: Insufficient documentation

## 2014-03-29 DIAGNOSIS — Z791 Long term (current) use of non-steroidal anti-inflammatories (NSAID): Secondary | ICD-10-CM | POA: Insufficient documentation

## 2014-03-29 DIAGNOSIS — K219 Gastro-esophageal reflux disease without esophagitis: Secondary | ICD-10-CM | POA: Insufficient documentation

## 2014-03-29 DIAGNOSIS — Z9889 Other specified postprocedural states: Secondary | ICD-10-CM | POA: Insufficient documentation

## 2014-03-29 MED ORDER — HYDROCODONE-ACETAMINOPHEN 5-325 MG PO TABS
1.0000 | ORAL_TABLET | Freq: Once | ORAL | Status: AC
  Administered 2014-03-29: 1 via ORAL
  Filled 2014-03-29: qty 1

## 2014-03-29 NOTE — ED Notes (Addendum)
Pain, swelling rt wrist,  Tripped and fell.  Good radial pulse

## 2014-03-29 NOTE — Discharge Instructions (Signed)
Wrist Fracture °A wrist fracture is a break in one of the wrist bones. A cast or splint is used to keep the injured bones from moving. The cast or splint is often on for 4 6 weeks. °HOME CARE °· Keep your injured wrist raised (elevated). Move your fingers as much as possible. °· Put ice on the injured area for the first 1 2 days after you have been treated or as told by your doctor. °· Put ice in a plastic bag. °· Place a towel between your skin and the bag. °· Leave the ice on for 15-20 minutes at a time, every 2 hours while you are awake. °· Do not put pressure on any part of your cast or splint. °· Protect your cast or splint with a plastic bag before bathing or showering. Do not lower your cast or splint into water. °· Only take medicine as told by your doctor. °GET HELP RIGHT AWAY IF:  °· Your cast or splint gets damaged or breaks. °· You have very bad pain that does not go away. °· You have more puffiness (swelling) than before the cast was put on. °· Your skin or fingernails below the injured area turn blue, gray, or feel cold or numb. °· You lose some of your feeling in the fingers. °MAKE SURE YOU:  °· Understand these instructions. °· Will watch your condition. °· Will get help right away if you are not doing well or get worse. °Document Released: 04/26/2008 Document Revised: 08/02/2012 Document Reviewed: 05/22/2012 °[employer]® Patient Information ©2014 [employer]. ° °

## 2014-03-29 NOTE — ED Notes (Signed)
Patient given discharge instruction, verbalized understand. Patient wheelchair out of the department.  

## 2014-03-29 NOTE — ED Provider Notes (Signed)
CSN: [id number]     Arrival date & time 03/29/14  1546 History   First MD Initiated Contact with Patient 03/29/14 1609     Chief Complaint  Patient presents with  . Fall     (Consider location/radiation/quality/duration/timing/severity/associated sxs/prior Treatment) Patient is a 73 y.o. female presenting with wrist pain. The history is provided by the patient.  Wrist Pain This is a new problem. Episode onset: several hours prior to ED arrival. The problem occurs constantly. The problem has been unchanged. Associated symptoms include arthralgias and joint swelling. Pertinent negatives include no abdominal pain, chest pain, chills, fever, neck pain, numbness, rash, vomiting or weakness. The symptoms are aggravated by twisting and bending. She has tried ice for the symptoms. The treatment provided mild relief.    Past Medical History  Diagnosis Date  . GERD (gastroesophageal reflux disease) 06/10/2011  . Chest pain 06/10/2011  . Port catheter in place 11/29/2012  . Hypercholesteremia   . Depression   . Neuropathy   . Cancer     jan 2012/mastectomy L; chemo/rad tx  . 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 Breast cancer     right in 2000 in [place] and underwent a mastectomy.   Past Surgical History  Procedure Laterality Date  . Mastectomy    . Joint replacement    . Hip replacements    . Cranial shunt    . Abdominal hysterectomy    . Tonsillectomy    . Cataract extraction w/phaco Right 04/02/2013    Procedure: CATARACT EXTRACTION PHACO AND INTRAOCULAR LENS PLACEMENT (IOC);  Surgeon: [name], MD;  Location: [facility];  Service: Ophthalmology;  Laterality: Right;  CDE 22.44  . Cataract extraction w/phaco Left 04/26/2013    Procedure: CATARACT EXTRACTION PHACO AND INTRAOCULAR LENS PLACEMENT (IOC);  Surgeon: [name], MD;  Location: [facility];  Service: Ophthalmology;  Laterality: Left;  CDE: 23.60  . Esophagogastroduodenoscopy (egd) with esophageal dilation N/A 05/03/2013   Procedure: ESOPHAGOGASTRODUODENOSCOPY (EGD) WITH ESOPHAGEAL DILATION;  Surgeon: [name], MD;  Location: [facility];  Service: Endoscopy;  Laterality: N/A;  145-moved to 100 Ann to notify pt   History reviewed. No pertinent family history. History  Substance Use Topics  . Smoking status: Former Research scientist (life sciences)  . Smokeless tobacco: Never Used     Comment: quit smoking over 20 yrs ago  . Alcohol Use: Yes     Comment: glass of liquor about twice a week   OB History   Grav Para Term Preterm Abortions TAB SAB Ect Mult Living                 Review of Systems  Constitutional: Negative for fever and chills.  Cardiovascular: Negative for chest pain.  Gastrointestinal: Negative for vomiting and abdominal pain.  Genitourinary: Negative for dysuria and difficulty urinating.  Musculoskeletal: Positive for arthralgias and joint swelling. Negative for neck pain.  Skin: Negative for color change, rash and wound.  Neurological: Negative for weakness and numbness.  All other systems reviewed and are negative.     Allergies  Codeine; Percodan; and Sulfa antibiotics  Home Medications   Prior to Admission medications   Medication Sig Start Date End Date Taking? Authorizing Provider  alendronate (FOSAMAX) 70 MG tablet Take 70 mg by mouth every 7 (seven) days. Take with a full glass of water on an empty stomach.    Historical Provider, MD  Alum & Mag Hydroxide-Simeth (GI COCKTAIL) SUSP suspension Take 30 mLs by mouth daily as needed (  stomach). Shake well.    Historical Provider, MD  Cholecalciferol 1000 UNITS tablet Take 2,000 Units by mouth daily.    Historical Provider, MD  Dexlansoprazole 30 MG capsule Take 30 mg by mouth daily.    Historical Provider, MD  escitalopram (LEXAPRO) 20 MG tablet Take 1 tablet (20 mg total) by mouth daily. 11/20/13   Baird Cancer, PA-C  fexofenadine (ALLEGRA) 180 MG tablet Take 180 mg by mouth daily.    Historical Provider, MD  fish oil-omega-3 fatty acids  1000 MG capsule Take 1 g by mouth 2 (two) times daily.     Historical Provider, MD  fluticasone (FLONASE) 50 MCG/ACT nasal spray Place into both nostrils daily.    Historical Provider, MD  HYDROcodone-acetaminophen (NORCO/VICODIN) 5-325 MG per tablet Take 1 tablet by mouth every 6 (six) hours as needed for pain.    Historical Provider, MD  ibuprofen (ADVIL,MOTRIN) 200 MG tablet Take 200 mg by mouth every 6 (six) hours as needed for pain.     Historical Provider, MD  naproxen sodium (ALEVE) 220 MG tablet Take 220 mg by mouth 2 (two) times daily as needed (Pain).    Historical Provider, MD  OVER THE COUNTER MEDICATION Place 1 drop into the left eye 2 (two) times daily. OTC Allergy eye drop    Historical Provider, MD  peg 3350 powder (MOVIPREP) 100 G SOLR Take 1 kit (200 g total) by mouth once. 03/13/14   Butch Penny, NP  pravastatin (PRAVACHOL) 40 MG tablet Take 40 mg by mouth daily.    Historical Provider, MD  Prenatal Vit-Fe Psac Cmplx-FA (PRENATAL MULTIVITAMIN) 60-1 MG tablet Take 1 tablet by mouth daily with breakfast.     Historical Provider, MD  sucralfate (CARAFATE) 1 GM/10ML suspension Take 10 mLs (1 g total) by mouth daily as needed. 01/23/14   Manon Hilding Kefalas, PA-C   BP 130/56  Pulse 85  Temp(Src) 98.9 F (37.2 C) (Oral)  Resp 18  SpO2 95% Physical Exam  Nursing note and vitals reviewed. Constitutional: She is oriented to person, place, and time. She appears well-developed and well-nourished. No distress.  HENT:  Head: Normocephalic and atraumatic.  Neck: Normal range of motion. Neck supple.  Cardiovascular: Normal rate, regular rhythm, normal heart sounds and intact distal pulses.   No murmur heard. Pulmonary/Chest: Effort normal and breath sounds normal. No respiratory distress. She exhibits no tenderness.  Musculoskeletal: She exhibits edema and tenderness.  Diffuse ttp of the distal right wrist, bony deformity present.  No open wounds  Radial pulse is brisk, distal sensation  intact.  CR< 2 sec.  No bruising or abrasions.  Patient has full ROM of the fingers.  Compartments soft.  Neurological: She is alert and oriented to person, place, and time. She exhibits normal muscle tone. Coordination normal.  Skin: Skin is warm and dry. No rash noted. No erythema.    ED Course  Procedures (including critical care time) Labs Review Labs Reviewed - No data to display  Imaging Review Dg Wrist Complete Right  03/29/2014   CLINICAL DATA:  Fall.  Pain.  EXAM: RIGHT WRIST - COMPLETE 3+ VIEW  COMPARISON:  None.  FINDINGS: There is a Colles type fracture of the distal radius with ventral angulation and dorsal tilt of the distal radial articular surface. The ulnar styloid is hypoplastic and there is no fracture in that location. No evidence of carpal fracture.  IMPRESSION: Colles type fracture of the distal radius with ventral angulation and dorsal tilt of  the distal radial articular surface.   Electronically Signed   By: Nelson Chimes M.D.   On: 03/29/2014 16:45     EKG Interpretation None      MDM   Final diagnoses:  Closed fracture of right wrist   X-ray results reviewed and discussed with the patient. She remains neurovascularly intact. Deformity of the distal wrist,I will consult orthopedics   1710 consult Dr. Luna Glasgow.  Recommends sugar tong splint and sling. He will see patient at 8:30 AM on Monday, 04/01/2014   Pain improved on recheck after splint application.  Remains NV intact.  Pt appears stable for d/c and agrees to elevate ice and return for any worsening sx's. States she has vicodin at home if needed for pain   Raven Furnas L. Vanessa Julian, PA-C 03/31/14 1945

## 2014-03-31 NOTE — ED Provider Notes (Signed)
Medical screening examination/treatment/procedure(s) were performed by non-physician practitioner and as supervising physician I was immediately available for consultation/collaboration.   EKG Interpretation None     ]   Maudry Diego, MD 03/31/14 2130

## 2014-04-03 ENCOUNTER — Encounter (HOSPITAL_COMMUNITY): Payer: Medicare Other | Attending: Hematology and Oncology

## 2014-04-03 DIAGNOSIS — E785 Hyperlipidemia, unspecified: Secondary | ICD-10-CM | POA: Insufficient documentation

## 2014-04-03 DIAGNOSIS — D649 Anemia, unspecified: Secondary | ICD-10-CM | POA: Insufficient documentation

## 2014-04-03 DIAGNOSIS — I10 Essential (primary) hypertension: Secondary | ICD-10-CM | POA: Insufficient documentation

## 2014-04-03 DIAGNOSIS — R5381 Other malaise: Secondary | ICD-10-CM | POA: Insufficient documentation

## 2014-04-03 DIAGNOSIS — Z0000 Encounter for general adult medical examination without abnormal findings: Secondary | ICD-10-CM | POA: Insufficient documentation

## 2014-04-03 DIAGNOSIS — R5383 Other fatigue: Secondary | ICD-10-CM

## 2014-04-03 DIAGNOSIS — C50919 Malignant neoplasm of unspecified site of unspecified female breast: Secondary | ICD-10-CM | POA: Insufficient documentation

## 2014-04-05 ENCOUNTER — Ambulatory Visit (HOSPITAL_COMMUNITY)
Admission: RE | Admit: 2014-04-05 | Discharge: 2014-04-05 | Disposition: A | Discharge status: Home / Self Care | Payer: Medicare Other | Source: Ambulatory Visit | Attending: Internal Medicine | Admitting: Internal Medicine

## 2014-04-05 ENCOUNTER — Encounter (HOSPITAL_COMMUNITY): Payer: Self-pay

## 2014-04-05 ENCOUNTER — Encounter (HOSPITAL_COMMUNITY)
Admission: RE | Disposition: A | Discharge status: Home / Self Care | Payer: Self-pay | Source: Ambulatory Visit | Attending: Internal Medicine

## 2014-04-05 DIAGNOSIS — D126 Benign neoplasm of colon, unspecified: Secondary | ICD-10-CM

## 2014-04-05 DIAGNOSIS — Z87891 Personal history of nicotine dependence: Secondary | ICD-10-CM | POA: Insufficient documentation

## 2014-04-05 DIAGNOSIS — Z882 Allergy status to sulfonamides status: Secondary | ICD-10-CM | POA: Insufficient documentation

## 2014-04-05 DIAGNOSIS — Z1211 Encounter for screening for malignant neoplasm of colon: Secondary | ICD-10-CM | POA: Insufficient documentation

## 2014-04-05 DIAGNOSIS — Z800 Family history of malignant neoplasm of digestive organs: Secondary | ICD-10-CM

## 2014-04-05 DIAGNOSIS — Z9010 Acquired absence of unspecified breast and nipple: Secondary | ICD-10-CM | POA: Insufficient documentation

## 2014-04-05 DIAGNOSIS — K5730 Diverticulosis of large intestine without perforation or abscess without bleeding: Secondary | ICD-10-CM | POA: Insufficient documentation

## 2014-04-05 DIAGNOSIS — Z86010 Personal history of colonic polyps: Secondary | ICD-10-CM

## 2014-04-05 DIAGNOSIS — K219 Gastro-esophageal reflux disease without esophagitis: Secondary | ICD-10-CM | POA: Insufficient documentation

## 2014-04-05 DIAGNOSIS — Z96649 Presence of unspecified artificial hip joint: Secondary | ICD-10-CM | POA: Insufficient documentation

## 2014-04-05 DIAGNOSIS — Z7983 Long term (current) use of bisphosphonates: Secondary | ICD-10-CM | POA: Insufficient documentation

## 2014-04-05 DIAGNOSIS — Z885 Allergy status to narcotic agent status: Secondary | ICD-10-CM | POA: Insufficient documentation

## 2014-04-05 DIAGNOSIS — Z9221 Personal history of antineoplastic chemotherapy: Secondary | ICD-10-CM | POA: Insufficient documentation

## 2014-04-05 DIAGNOSIS — Z79899 Other long term (current) drug therapy: Secondary | ICD-10-CM | POA: Insufficient documentation

## 2014-04-05 DIAGNOSIS — E7800 Pure hypercholesterolemia, unspecified: Secondary | ICD-10-CM | POA: Insufficient documentation

## 2014-04-05 DIAGNOSIS — Z923 Personal history of irradiation: Secondary | ICD-10-CM | POA: Insufficient documentation

## 2014-04-05 DIAGNOSIS — Z853 Personal history of malignant neoplasm of breast: Secondary | ICD-10-CM | POA: Insufficient documentation

## 2014-04-05 HISTORY — PX: COLONOSCOPY: SHX5424

## 2014-04-05 SURGERY — COLONOSCOPY
Anesthesia: Moderate Sedation

## 2014-04-05 MED ORDER — MEPERIDINE HCL 50 MG/ML IJ SOLN
INTRAMUSCULAR | Status: AC
  Filled 2014-04-05: qty 1

## 2014-04-05 MED ORDER — STERILE WATER FOR IRRIGATION IR SOLN
Status: DC | PRN
  Administered 2014-04-05: 11:00:00

## 2014-04-05 MED ORDER — MEPERIDINE HCL 50 MG/ML IJ SOLN
INTRAMUSCULAR | Status: DC | PRN
  Administered 2014-04-05 (×2): 25 mg via INTRAVENOUS

## 2014-04-05 MED ORDER — SODIUM CHLORIDE 0.9 % IV SOLN: INTRAVENOUS | Status: DC

## 2014-04-05 MED ORDER — MIDAZOLAM HCL 5 MG/5ML IJ SOLN
INTRAMUSCULAR | Status: AC
  Filled 2014-04-05: qty 10

## 2014-04-05 MED ORDER — MIDAZOLAM HCL 5 MG/5ML IJ SOLN
INTRAMUSCULAR | Status: DC | PRN
  Administered 2014-04-05: 1 mg via INTRAVENOUS
  Administered 2014-04-05 (×3): 2 mg via INTRAVENOUS

## 2014-04-05 NOTE — Op Note (Signed)
COLONOSCOPY PROCEDURE REPORT  PATIENT:  Roberta Gonzalez  MR#:  [id number] Birthdate:  July 19, 1941, 73 y.o., female Endoscopist:  Dr. [name], MD Referred By:  Dr. Ralene Bathe. Dondiego, MD  Procedure Date: 04/05/2014  Procedure:   Colonoscopy with snare polypectomy.  Indications: Patient is 73 year old Caucasian female who has history of colonic polyps and last exam was 20 years ago is here for surveillance colonoscopy. Family history significant for colon carcinoma in her daughter who is 44 at the time of diagnosis and doing fine 10 he is later in her maternal grandmother had colon carcinoma in her early 59s.  Informed Consent:  The procedure and risks were reviewed with the patient and informed consent was obtained.  Medications:  Demerol 50 mg IV Versed 7 mg IV  Description of procedure:  After a digital rectal exam was performed, that colonoscope was advanced from the anus through the rectum and colon to the area of the cecum, ileocecal valve and appendiceal orifice. The cecum was deeply intubated. These structures were well-seen and photographed for the record. From the level of the cecum and ileocecal valve, the scope was slowly and cautiously withdrawn. The mucosal surfaces were carefully surveyed utilizing scope tip to flexion to facilitate fold flattening as needed. The scope was pulled down into the rectum where a thorough exam including retroflexion was performed.  Findings:   Prep fair. 8 mm sessile polyp at cecum in close vicinity to appendiceal orifice. This polyp was biopsied for histology and restless ablated with argon plasma coagulator. Small sessile polyp at hepatic flexure was also ablated with APC. 36 mm polyps were hot snared from transverse colon and submitted together. 12 mm broad-based polyp was hot snared from the transverse colon. This polyp was removed using Roth net. 14 mm polyp on a thick stalk was snared from sigmoid colon. 2 instinct clips were applied to  polypectomy site. 7 mm polyp was snared from distal sigmoid colon and submitted along with other polyp removed from sigmoid colon. Small polyp at sigmoid colon was coagulated using snare tip. Few diverticula hepatic flexure and sigmoid colon. Normal rectal mucosa and anorectal junction.    Therapeutic/Diagnostic Maneuvers Performed:  See above  Complications:  None  Cecal Withdrawal Time:  49 minutes.   Impression:  Examination performed to cecum. Patient had 10 polyps altogether as follows;  8 mm sessile cecal polyp was ablated with argon plasma coagulator after biopsy was taken for histology.  Three polyps were coagulated; these are located at hepatic flexure, transverse and sigmoid colon.  Six polyps were snared ranging in size from 6-14 mm. 2 instinct clips applied to polypectomy site at sigmoid colon.  Mild diverticulosis in the hepatic flexure and sigmoid colon.   Recommendations:  Standard instructions given. No aspirin or NSAIDs for 1 week. Patient and her daughter informed that she cannot have an MRI until clips have passed. I will contact patient with biopsy results and further recommendations.  [name]  04/05/2014 12:02 PM  CC: Dr. Maricela Curet, MD & Dr. Rayne Du ref. provider found

## 2014-04-05 NOTE — H&P (Signed)
Roberta Gonzalez is an 73 y.o. female.   Chief Complaint: Patient is here for colonoscopy. HPI: Patient is 73 year old Caucasian female who has history of colonic polyps and is here for surveillance colonoscopy. Patient's last exam was in [facility] years ago but she decided to wait this long to have an exam. She denies abdominal pain change in bowel habits or rectal bleeding. Family history significant for colon carcinoma in maternal grandmother was around 39 at the time of diagnosis. Her daughter had surgery for colon carcinoma at age 20 and now is 72 years old.  Past Medical History  Diagnosis Date  . GERD (gastroesophageal reflux disease) 06/10/2011  . Chest pain 06/10/2011  . Port catheter in place 11/29/2012  . Hypercholesteremia   . Depression   . Neuropathy   . Cancer     jan 2012/mastectomy L; chemo/rad tx  . 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 Breast cancer     right in 2000 in [place] and underwent a mastectomy.    Past Surgical History  Procedure Laterality Date  . Mastectomy    . Joint replacement    . Hip replacements    . Cranial shunt    . Abdominal hysterectomy    . Tonsillectomy    . Cataract extraction w/phaco Right 04/02/2013    Procedure: CATARACT EXTRACTION PHACO AND INTRAOCULAR LENS PLACEMENT (IOC);  Surgeon: [name], MD;  Location: [facility];  Service: Ophthalmology;  Laterality: Right;  CDE 22.44  . Cataract extraction w/phaco Left 04/26/2013    Procedure: CATARACT EXTRACTION PHACO AND INTRAOCULAR LENS PLACEMENT (IOC);  Surgeon: [name], MD;  Location: [facility];  Service: Ophthalmology;  Laterality: Left;  CDE: 23.60  . Esophagogastroduodenoscopy (egd) with esophageal dilation N/A 05/03/2013    Procedure: ESOPHAGOGASTRODUODENOSCOPY (EGD) WITH ESOPHAGEAL DILATION;  Surgeon: [name], MD;  Location: [facility];  Service: Endoscopy;  Laterality: N/A;  145-moved to 100 Ann to notify pt  . Repair of fractured penis Right last  wednesday    Dr. Luna Glasgow    History reviewed. No pertinent family history. Social History:  reports that she has quit smoking. She has never used smokeless tobacco. She reports that she drinks alcohol. She reports that she does not use illicit drugs.  Allergies:  Allergies  Allergen Reactions  . Codeine Other (See Comments)    Makes pt nervous  . Percodan [Oxycodone-Aspirin] Other (See Comments)    Mood change (climbing the walls, angry, irritable)  . Sulfa Antibiotics Nausea Only    Medications Prior to Admission  Medication Sig Dispense Refill  . alendronate (FOSAMAX) 70 MG tablet Take 70 mg by mouth every 7 (seven) days. Take with a full glass of water on an empty stomach.      . Alum & Mag Hydroxide-Simeth (GI COCKTAIL) SUSP suspension Take 30 mLs by mouth daily as needed (stomach). Shake well.      . Cholecalciferol 1000 UNITS tablet Take 2,000 Units by mouth every morning.       Marland Kitchen Dexlansoprazole 30 MG capsule Take 30 mg by mouth every morning.       . diphenhydrAMINE (BENADRYL) 25 mg capsule Take 25 mg by mouth every 4 (four) hours as needed for allergies.      Marland Kitchen escitalopram (LEXAPRO) 20 MG tablet Take 1 tablet (20 mg total) by mouth daily.  30 tablet  5  . fexofenadine (ALLEGRA) 180 MG tablet Take 180 mg by mouth every morning.       Marland Kitchen  fish oil-omega-3 fatty acids 1000 MG capsule Take 1 g by mouth 2 (two) times daily.       . fluticasone (FLONASE) 50 MCG/ACT nasal spray Place 1 spray into both nostrils daily.      Marland Kitchen HYDROcodone-acetaminophen (NORCO/VICODIN) 5-325 MG per tablet Take 0.5-1 tablets by mouth daily as needed for severe pain.       Marland Kitchen ibuprofen (ADVIL,MOTRIN) 200 MG tablet Take 200 mg by mouth every 6 (six) hours as needed for pain.       . naproxen sodium (ALEVE) 220 MG tablet Take 440 mg by mouth daily as needed (Pain).       Marland Kitchen OVER THE COUNTER MEDICATION Place 1 drop into the left eye 2 (two) times daily. OTC Allergy eye drop      . peg 3350 powder (MOVIPREP) 100  G SOLR Take 1 kit (200 g total) by mouth once.  1 kit  0  . pravastatin (PRAVACHOL) 40 MG tablet Take 40 mg by mouth at bedtime.       . Prenatal Vit-Fe Psac Cmplx-FA (PRENATAL MULTIVITAMIN) 60-1 MG tablet Take 1 tablet by mouth daily with breakfast.       . sucralfate (CARAFATE) 1 GM/10ML suspension Take 10 mLs (1 g total) by mouth daily as needed.  420 mL  3    No results found for this or any previous visit (from the past 48 hour(s)). No results found.  ROS  Blood pressure 144/56, pulse 78, temperature 98.6 F (37 C), temperature source Oral, resp. rate 12, height $RemoveBe'5\' 3"'GkaqnuFnI$  (1.6 m), weight 120 lb (54.432 kg), SpO2 99.00%. Physical Exam  Constitutional:  Well-developed thin Caucasian female in NAD  HENT:  Mouth/Throat: Oropharynx is clear and moist.  Eyes: Conjunctivae are normal. No scleral icterus.  Neck: No thyromegaly present.  Segment of VP shunt fell at right side of neck  Cardiovascular: Normal rate and regular rhythm.   Murmur: grade 1/6 short systolic murmur best heard at LLSB. Respiratory: Effort normal and breath sounds normal.  GI: Soft. She exhibits no distension and no mass. There is no tenderness.  Musculoskeletal: She exhibits no edema.  She has cast at the right wrist for radius fracture.  Lymphadenopathy:    She has no cervical adenopathy.  Skin: Skin is warm and dry.     Assessment/Plan History of colonic polyps. Family history of colon carcinoma(maternal grandmother and daughter). Surveillance colonoscopy.  [employer] 04/05/2014, 10:39 AM

## 2014-04-05 NOTE — Discharge Instructions (Addendum)
No aspirin or NSAIDs for 1 week. Resume other medications as before. Resume usual diet. No driving for 24 hours Physician will call you with biopsy results. Remember you cannot have an MRI until clips have passed Colonoscopy, Care After Refer to this sheet in the next few weeks. These instructions provide you with information on caring for yourself after your procedure. Your health care provider may also give you more specific instructions. Your treatment has been planned according to current medical practices, but problems sometimes occur. Call your health care provider if you have any problems or questions after your procedure. WHAT TO EXPECT AFTER THE PROCEDURE  After your procedure, it is typical to have the following:  A small amount of blood in your stool.  Moderate amounts of gas and mild abdominal cramping or bloating. HOME CARE INSTRUCTIONS  Do not drive, operate machinery, or sign important documents for 24 hours.  You may shower and resume your regular physical activities, but move at a slower pace for the first 24 hours.  Take frequent rest periods for the first 24 hours.  Walk around or put a warm pack on your abdomen to help reduce abdominal cramping and bloating.  Drink enough fluids to keep your urine clear or pale yellow.  You may resume your normal diet as instructed by your health care provider. Avoid heavy or fried foods that are hard to digest.  Avoid drinking alcohol for 24 hours or as instructed by your health care provider.  Only take over-the-counter or prescription medicines as directed by your health care provider.  If a tissue sample (biopsy) was taken during your procedure:  Do not take aspirin or blood thinners for 7 days, or as instructed by your health care provider.  Do not drink alcohol for 7 days, or as instructed by your health care provider.  Eat soft foods for the first 24 hours. SEEK MEDICAL CARE IF: You have persistent spotting of blood  in your stool 2 3 days after the procedure. SEEK IMMEDIATE MEDICAL CARE IF:  You have more than a small spotting of blood in your stool.  You pass large blood clots in your stool.  Your abdomen is swollen (distended).  You have nausea or vomiting.  You have a fever.  You have increasing abdominal pain that is not relieved with medicine.  Colon Polyps Polyps are lumps of extra tissue growing inside the body. Polyps can grow in the large intestine (colon). Most colon polyps are noncancerous (benign). However, some colon polyps can become cancerous over time. Polyps that are larger than a pea may be harmful. To be safe, caregivers remove and test all polyps. CAUSES  Polyps form when mutations in the genes cause your cells to grow and divide even though no more tissue is needed. RISK FACTORS There are a number of risk factors that can increase your chances of getting colon polyps. They include:  Being older than 50 years.  Family history of colon polyps or colon cancer.  Long-term colon diseases, such as colitis or Crohn disease.  Being overweight.  Smoking.  Being inactive.  Drinking too much alcohol. SYMPTOMS  Most small polyps do not cause symptoms. If symptoms are present, they may include:  Blood in the stool. The stool may look dark red or black.  Constipation or diarrhea that lasts longer than 1 week. DIAGNOSIS People often do not know they have polyps until their caregiver finds them during a regular checkup. Your caregiver can use 4 tests to  check for polyps:  Digital rectal exam. The caregiver wears gloves and feels inside the rectum. This test would find polyps only in the rectum.  Barium enema. The caregiver puts a liquid called barium into your rectum before taking X-rays of your colon. Barium makes your colon look white. Polyps are dark, so they are easy to see in the X-ray pictures.  Sigmoidoscopy. A thin, flexible tube (sigmoidoscope) is placed into your  rectum. The sigmoidoscope has a light and tiny camera in it. The caregiver uses the sigmoidoscope to look at the last third of your colon.  Colonoscopy. This test is like sigmoidoscopy, but the caregiver looks at the entire colon. This is the most common method for finding and removing polyps. TREATMENT  Any polyps will be removed during a sigmoidoscopy or colonoscopy. The polyps are then tested for cancer. PREVENTION  To help lower your risk of getting more colon polyps:  Eat plenty of fruits and vegetables. Avoid eating fatty foods.  Do not smoke.  Avoid drinking alcohol.  Exercise every day.  Lose weight if recommended by your caregiver.  Eat plenty of calcium and folate. Foods that are rich in calcium include milk, cheese, and broccoli. Foods that are rich in folate include chickpeas, kidney beans, and spinach. HOME CARE INSTRUCTIONS Keep all follow-up appointments as directed by your caregiver. You may need periodic exams to check for polyps. SEEK MEDICAL CARE IF: You notice bleeding during a bowel movement.

## 2014-04-08 ENCOUNTER — Encounter (HOSPITAL_COMMUNITY): Payer: Self-pay | Admitting: Internal Medicine

## 2014-04-11 ENCOUNTER — Encounter (INDEPENDENT_AMBULATORY_CARE_PROVIDER_SITE_OTHER): Payer: Self-pay | Admitting: *Deleted

## 2014-05-15 ENCOUNTER — Encounter (HOSPITAL_COMMUNITY): Payer: Medicare Other | Attending: Hematology and Oncology

## 2014-05-15 DIAGNOSIS — R5383 Other fatigue: Secondary | ICD-10-CM

## 2014-05-15 DIAGNOSIS — R5381 Other malaise: Secondary | ICD-10-CM | POA: Insufficient documentation

## 2014-05-15 DIAGNOSIS — Z0000 Encounter for general adult medical examination without abnormal findings: Secondary | ICD-10-CM | POA: Insufficient documentation

## 2014-05-15 DIAGNOSIS — I10 Essential (primary) hypertension: Secondary | ICD-10-CM | POA: Insufficient documentation

## 2014-05-15 DIAGNOSIS — E785 Hyperlipidemia, unspecified: Secondary | ICD-10-CM | POA: Insufficient documentation

## 2014-05-15 DIAGNOSIS — C50919 Malignant neoplasm of unspecified site of unspecified female breast: Secondary | ICD-10-CM | POA: Insufficient documentation

## 2014-05-15 DIAGNOSIS — Z452 Encounter for adjustment and management of vascular access device: Secondary | ICD-10-CM

## 2014-05-15 DIAGNOSIS — D649 Anemia, unspecified: Secondary | ICD-10-CM | POA: Insufficient documentation

## 2014-05-15 MED ORDER — HEPARIN SOD (PORK) LOCK FLUSH 100 UNIT/ML IV SOLN
500.0000 [IU] | Freq: Once | INTRAVENOUS | Status: AC
  Administered 2014-05-15: 500 [IU] via INTRAVENOUS

## 2014-05-15 MED ORDER — HEPARIN SOD (PORK) LOCK FLUSH 100 UNIT/ML IV SOLN
INTRAVENOUS | Status: AC
  Filled 2014-05-15: qty 5

## 2014-05-15 MED ORDER — SODIUM CHLORIDE 0.9 % IJ SOLN
10.0000 mL | INTRAMUSCULAR | Status: DC | PRN
  Administered 2014-05-15: 10 mL via INTRAVENOUS

## 2014-05-15 NOTE — Progress Notes (Signed)
Roberta Gonzalez presented for Portacath access and flush. Proper placement of portacath confirmed by CXR. Portacath located rt chest wall accessed with  H 20 needle. Good blood return present. Portacath flushed with 67ml NS and 500U/29ml Heparin and needle removed intact. Procedure without incident. Patient tolerated procedure well.

## 2014-06-03 ENCOUNTER — Other Ambulatory Visit (HOSPITAL_COMMUNITY): Payer: Self-pay | Admitting: Oncology

## 2014-06-03 DIAGNOSIS — C50919 Malignant neoplasm of unspecified site of unspecified female breast: Secondary | ICD-10-CM

## 2014-06-03 MED ORDER — ESCITALOPRAM OXALATE 20 MG PO TABS: 20.0000 mg | ORAL_TABLET | Freq: Every day | ORAL | Status: DC

## 2014-06-26 ENCOUNTER — Encounter (HOSPITAL_COMMUNITY): Payer: Medicare Other | Attending: Hematology and Oncology

## 2014-06-26 DIAGNOSIS — C50919 Malignant neoplasm of unspecified site of unspecified female breast: Secondary | ICD-10-CM

## 2014-06-26 DIAGNOSIS — I10 Essential (primary) hypertension: Secondary | ICD-10-CM | POA: Insufficient documentation

## 2014-06-26 DIAGNOSIS — R5381 Other malaise: Secondary | ICD-10-CM | POA: Insufficient documentation

## 2014-06-26 DIAGNOSIS — Z0000 Encounter for general adult medical examination without abnormal findings: Secondary | ICD-10-CM | POA: Insufficient documentation

## 2014-06-26 DIAGNOSIS — D649 Anemia, unspecified: Secondary | ICD-10-CM | POA: Insufficient documentation

## 2014-06-26 DIAGNOSIS — E785 Hyperlipidemia, unspecified: Secondary | ICD-10-CM | POA: Insufficient documentation

## 2014-06-26 DIAGNOSIS — Z452 Encounter for adjustment and management of vascular access device: Secondary | ICD-10-CM

## 2014-06-26 DIAGNOSIS — R5383 Other fatigue: Secondary | ICD-10-CM

## 2014-06-26 MED ORDER — HEPARIN SOD (PORK) LOCK FLUSH 100 UNIT/ML IV SOLN
500.0000 [IU] | Freq: Once | INTRAVENOUS | Status: AC
  Administered 2014-06-26: 500 [IU] via INTRAVENOUS

## 2014-06-26 MED ORDER — HEPARIN SOD (PORK) LOCK FLUSH 100 UNIT/ML IV SOLN
INTRAVENOUS | Status: AC
  Filled 2014-06-26: qty 5

## 2014-06-26 MED ORDER — SODIUM CHLORIDE 0.9 % IJ SOLN
10.0000 mL | Freq: Once | INTRAMUSCULAR | Status: AC
  Administered 2014-06-26: 10 mL via INTRAVENOUS

## 2014-06-26 NOTE — Progress Notes (Signed)
Roberta Gonzalez presented for Portacath access and flush. Proper placement of portacath confirmed by CXR. Portacath located rt chest wall accessed with  H 20 needle. Good blood return present. Portacath flushed with 76ml NS and 500U/38ml Heparin and needle removed intact. Procedure without incident. Patient tolerated procedure well.

## 2014-08-07 ENCOUNTER — Encounter (HOSPITAL_COMMUNITY): Payer: Medicare Other | Attending: Hematology and Oncology

## 2014-08-07 VITALS — BP 107/38 | HR 70 | Temp 97.8°F

## 2014-08-07 DIAGNOSIS — Z9889 Other specified postprocedural states: Secondary | ICD-10-CM

## 2014-08-07 DIAGNOSIS — E785 Hyperlipidemia, unspecified: Secondary | ICD-10-CM | POA: Insufficient documentation

## 2014-08-07 DIAGNOSIS — R5383 Other fatigue: Secondary | ICD-10-CM

## 2014-08-07 DIAGNOSIS — C50919 Malignant neoplasm of unspecified site of unspecified female breast: Secondary | ICD-10-CM | POA: Insufficient documentation

## 2014-08-07 DIAGNOSIS — D649 Anemia, unspecified: Secondary | ICD-10-CM | POA: Insufficient documentation

## 2014-08-07 DIAGNOSIS — R5381 Other malaise: Secondary | ICD-10-CM | POA: Insufficient documentation

## 2014-08-07 DIAGNOSIS — I10 Essential (primary) hypertension: Secondary | ICD-10-CM | POA: Insufficient documentation

## 2014-08-07 DIAGNOSIS — Z95828 Presence of other vascular implants and grafts: Secondary | ICD-10-CM

## 2014-08-07 DIAGNOSIS — Z0000 Encounter for general adult medical examination without abnormal findings: Secondary | ICD-10-CM | POA: Insufficient documentation

## 2014-08-07 MED ORDER — HEPARIN SOD (PORK) LOCK FLUSH 100 UNIT/ML IV SOLN
500.0000 [IU] | Freq: Once | INTRAVENOUS | Status: AC
  Administered 2014-08-07: 500 [IU] via INTRAVENOUS
  Filled 2014-08-07: qty 5

## 2014-08-07 MED ORDER — HEPARIN SOD (PORK) LOCK FLUSH 100 UNIT/ML IV SOLN
INTRAVENOUS | Status: AC
  Filled 2014-08-07: qty 5

## 2014-08-07 MED ORDER — SODIUM CHLORIDE 0.9 % IJ SOLN
10.0000 mL | INTRAMUSCULAR | Status: DC | PRN
  Administered 2014-08-07: 10 mL via INTRAVENOUS

## 2014-08-07 NOTE — Progress Notes (Signed)
Roberta Gonzalez presented for Portacath access and flush. Proper placement of portacath confirmed by CXR. Portacath located rt chest wall accessed with  H 20 needle. Good blood return present. Portacath flushed with 37ml NS and 500U/24ml Heparin and needle removed intact. Procedure without incident. Patient tolerated procedure well.

## 2014-08-19 NOTE — Progress Notes (Signed)
Roberta Gonzalez, [place] [address]  Invasive ductal carcinoma of breast, unspecified laterality - Plan: CBC with Differential, Comprehensive metabolic panel, CBC with Differential, Comprehensive metabolic panel, CBC with Differential, Comprehensive metabolic panel, Lipid panel  Preventative health care - Plan: Lipid panel, TSH, T4, Lipid panel, TSH, T4  CURRENT THERAPY: Surveillance per NCCN guidelines  INTERVAL HISTORY: Roberta Gonzalez 73 y.o. 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 HER2 was positive with a ratio of 2.09. Primary was 2.1 cm. She had a 2nd lesion that was 0.4 cm. Underwent mastectomy 11/27/2010. She therefore had a T2 N1 MX cancer. She has had 6 cycles of carboplatin with Taxotere and 52 weeks of Herceptin (finishing on 12/03/2011).     Invasive ductal carcinoma of breast   10/28/2010 Initial Diagnosis Breast, left, needle core biopsy, :  - INVASIVE DUCTAL CARCINOMA.   11/27/2010 Surgery Left simple mastectomy: Multicentric invasive ductal cancer, 2.1 and 0.4 cm, +LVI, 2/8 positive lymph nodes.  ER-, PR-, HER2 3+, Ki-67 82%.   12/07/2010 - 05/03/2011 Chemotherapy Carboplatin/Taxotere x 6 cycles (Approximate dates)   12/07/2010 - 12/03/2011 Antibody Plan 52 weeks of Herceptin (Aproximate start date)   05/05/2011 Remission    Roberta Gonzalez underwent colonoscopy by Dr. Laural Golden on 04/05/2014.  She had a total of 10 polyps that were removed.  I personally reviewed and went over pathology results with the patient.  All pathology returned negative for malignancy.  Dr. Laural Golden wishes to repeat colonoscopy in 1 year.  Roberta Gonzalez also reported to the ED on 03/29/2014 after falling. I personally reviewed and went over radiographic studies with the patient.  The results  are noted within this dictation.  An xray was performed showing the following results of her wrist: Colles type fracture of the distal radius with ventral angulation  and dorsal tilt of the distal radial articular surface.  This has been followed by Dr. Luna Glasgow.  I personally reviewed and went over laboratory results with the patient.  The results are noted within this dictation.  She has orders from her primary care provider for additional labs.  We will perform them and fax results to Dr. Cindie Gonzalez.  With her recent bouts of fractures, I recommend bone density examination per [place] preventative task force recommendations.  She is on Fosamax on we know PO bisphosphates are not absorbed well (~20%).  Therefore, if bone density is not improved or stable, I would recommend Prolia every 6 months, due to efficacy and known benefit over other IV/SQ treatments.  Oncologically, she denies any complaints and ROS questioning is negative.  Past Medical History  Diagnosis Date  . GERD (gastroesophageal reflux disease) 06/10/2011  . Chest pain 06/10/2011  . Port catheter in place 11/29/2012  . Hypercholesteremia   . Depression   . Neuropathy   . Cancer     jan 2012/mastectomy L; chemo/rad tx  . 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 Breast cancer     right in 2000 in [place] and underwent a mastectomy.    has Invasive ductal carcinoma of breast; GERD (gastroesophageal reflux disease); Chest pain; Port catheter in place; Personal history of colonic polyps; and Family hx of colon cancer on her problem list.     is allergic to codeine; percodan; and  sulfa antibiotics.  [name] does not currently have medications on file.  Past Surgical History  Procedure Laterality Date  . Mastectomy    . Joint replacement    . Hip replacements    . Cranial shunt    . Abdominal hysterectomy    . Tonsillectomy    . Cataract extraction w/phaco Right 04/02/2013    Procedure: CATARACT EXTRACTION PHACO  AND INTRAOCULAR LENS PLACEMENT (IOC);  Surgeon: [name], MD;  Location: [facility];  Service: Ophthalmology;  Laterality: Right;  CDE 22.44  . Cataract extraction w/phaco Left 04/26/2013    Procedure: CATARACT EXTRACTION PHACO AND INTRAOCULAR LENS PLACEMENT (IOC);  Surgeon: [name], MD;  Location: [facility];  Service: Ophthalmology;  Laterality: Left;  CDE: 23.60  . Esophagogastroduodenoscopy (egd) with esophageal dilation N/A 05/03/2013    Procedure: ESOPHAGOGASTRODUODENOSCOPY (EGD) WITH ESOPHAGEAL DILATION;  Surgeon: [name], MD;  Location: [facility];  Service: Endoscopy;  Laterality: N/A;  145-moved to 100 Ann to notify pt  . Repair of fractured penis Right last wednesday    Dr. Luna Glasgow  . Colonoscopy N/A 04/05/2014    Procedure: COLONOSCOPY;  Surgeon: [name], MD;  Location: [facility];  Service: Endoscopy;  Laterality: N/A;  11:15    Denies any headaches, dizziness, double vision, fevers, chills, night sweats, nausea, vomiting, diarrhea, constipation, chest pain, heart palpitations, shortness of breath, blood in stool, black tarry stool, urinary pain, urinary burning, urinary frequency, hematuria.   PHYSICAL EXAMINATION  ECOG PERFORMANCE STATUS: 0 - Asymptomatic  Filed Vitals:   08/23/14 1030  BP: 90/43  Pulse: 72  Temp: 98 F (36.7 C)  Resp: 15    GENERAL:alert, no distress, well nourished, well developed, comfortable, cooperative and smiling, accompanied by daughter SKIN: skin color, texture, turgor are normal, no rashes or significant lesions HEAD: Normocephalic, No masses, lesions, tenderness or abnormalities EYES: normal, PERRLA, EOMI, Conjunctiva are pink and non-injected EARS: External ears normal OROPHARYNX:mucous membranes are moist  NECK: supple, no adenopathy, thyroid normal size, non-tender, without nodularity, no stridor, non-tender, trachea midline LYMPH:  no palpable lymphadenopathy, no hepatosplenomegaly BREAST:risk and benefit of breast  self-exam was discussed LUNGS: clear to auscultation  HEART: regular rate & rhythm, no murmurs, no gallops, S1 normal and S2 normal ABDOMEN:abdomen soft, non-tender, normal bowel sounds and no masses or organomegaly BACK: Back symmetric, no curvature. EXTREMITIES:less then 2 second capillary refill, no joint deformities, effusion, or inflammation, no skin discoloration, no clubbing, no cyanosis  NEURO: alert & oriented x 3 with fluent speech, no focal motor/sensory deficits, gait normal   LABORATORY DATA: CBC    Component Value Date/Time   WBC 5.3 02/20/2014 0945   RBC 3.70* 02/20/2014 0945   HGB 12.1 02/20/2014 0945   HCT 37.0 02/20/2014 0945   PLT 254 02/20/2014 0945   MCV 100.0 02/20/2014 0945   MCH 32.7 02/20/2014 0945   MCHC 32.7 02/20/2014 0945   RDW 11.9 02/20/2014 0945   LYMPHSABS 1.4 02/20/2014 0945   MONOABS 0.6 02/20/2014 0945   EOSABS 0.2 02/20/2014 0945   BASOSABS 0.0 02/20/2014 0945      Chemistry      Component Value Date/Time   NA 143 02/20/2014 0945   K 4.0 02/20/2014 0945   CL 105 02/20/2014 0945   CO2 28 02/20/2014 0945   BUN 12 02/20/2014 0945   CREATININE 0.49* 02/20/2014 0945      Component Value Date/Time   CALCIUM 9.4 02/20/2014 0945   ALKPHOS 48 02/20/2014 0945  AST 14 02/20/2014 0945   ALT 12 02/20/2014 0945   BILITOT 0.5 02/20/2014 0945        PATHOLOGY:  04/05/2014  Diagnosis 1. Colon, polyp(s), cecal - FRAGMENTS OF SESSILE SERRATED POLYP/ADENOMA WITH CYTOLOGIC DYSPLASIA. - NO EVIDENCE OF MALIGNANCY. 2. Colon, polyp(s), transverse - TUBULAR ADENOMA (THREE FRAGMENTS). NO HIGH GRADE DYSPLASIA OR MALIGNANCY IDENTIFIED. 3. Colon, polyp(s), mid transverse - TUBULAR ADENOMA (ONE FRAGMENT). NO HIGH GRADE DYSPLASIA OR MALIGNANCY IDENTIFIED. 4. Colon, polyp(s), sigmoid - TUBULAR ADENOMA (ONE FRAGMENT). NO HIGH GRADE DYSPLASIA OR MALIGNANCY IDENTIFIED. - INKED MARGINS, NEGATIVE FOR DYSPLASIA OR MALIGNANCY. Microscopic Comment 1. Reference: Rex DK, Celene Kras, Batts KP,  [place], [place], [place], [place], K[place] CJ, [place], [place], [place] RD, Wardell [place], [place] DC, [place], Wise PE , Young J , [place] J. Serrated lesions of the colorectum: review and recommendations from an expert panel. Am Nicki [place]. 2012;107(9):1315-29. Gretel Acre LI MD Pathologist, Electronic Signature (Case signed 04/08/2014)     ASSESSMENT:  1. 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 HER2 was positive with a ratio of 2.09. Primary was 2.1 cm. She had a 2nd lesion that was 0.4 cm. Underwent mastectomy 11/27/2010. She therefore had a T2 N1 MX cancer. She has had 6 cycles of carboplatin with Taxotere and 52 weeks of Herceptin (finishing on 12/03/2011).  2. Gastroesophageal reflux disease, on Nexium.  3. Bilateral hip replacements with chronic hip discomfort. Her daughter states she also has a history of spinal stenosis.  4. Chronic constipation  5. 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  6. History of right mastectomy in 2001.  7. Colonoscopy in May 2015 demonstrated 10 polyps, all negative for malignancy.  Repeat colonoscopy in May 2016 by Dr. Laural Golden. 8. Right wrist colles type fracture. 9. On Fosamax for suspected osteoporosis without bone density documented in [facility].  Recommend Bone density exam to evaluate effectiveness of Fosamax.  Patient Active Problem List   Diagnosis Date Noted  . Personal history of colonic polyps 03/13/2014  . Family hx of colon cancer 03/13/2014  . Port catheter in place 11/29/2012  . 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 GERD (gastroesophageal reflux disease) 06/10/2011  . Chest pain 06/10/2011     PLAN:  1. I personally reviewed and went over laboratory results with the patient.  The results are noted within this  dictation. 2. I personally reviewed and went over radiographic studies with the patient.  The results are noted within this dictation.   3. I personally reviewed and went over pathology results with the patient. 4. Follow-up colonoscopy with Dr. Laural Golden in May 2016. 5. Recommend bone density exam per [place] Preventative Task Force recommendations and recent wrist fracture.  Will defer to primary care provider.  She is on Fosamax by another presciber and follow-up on its effectiveness is warranted.  If not stable or improved, recommend Prolia to improve absorption. 6. Labs today: CBC diff, CMET 7. Additional labs ordered by Dr. Cindie Gonzalez: TSH, T4, Lipid panel (fasting) 8. Return in 6 months for follow-up   THERAPY PLAN:  NCCN guidelines recommends the following surveillance for invasive breast cancer:  A. History and Physical exam every 4-6 months for 5 years and then every 12 months.  B. Mammography every 12 months  C. Women  on Tamoxifen: annual gynecologic assessment every 12 months if uterus is present.  D. Women on aromatase inhibitor or who experience ovarian failure secondary to treatment should have monitoring of bone health with a bone mineral density determination at baseline and periodically thereafter.  E. Assess and encourage adherence to adjuvant endocrine therapy.  F. Evidence suggests that active lifestyle and achieving and maintaining an ideal body weight (20-25 BMI) may lead to optimal breast cancer outcomes.   All questions were answered. The patient knows to call the clinic with any problems, questions or concerns. We can certainly see the patient much sooner if necessary.  Patient and plan discussed with Dr. Farrel Gobble and he is in agreement with the aforementioned.   Jediah Horger 08/23/2014

## 2014-08-22 ENCOUNTER — Ambulatory Visit (HOSPITAL_COMMUNITY): Payer: Medicare Other | Admitting: Oncology

## 2014-08-23 ENCOUNTER — Encounter (HOSPITAL_COMMUNITY): Payer: Medicare Other | Attending: Hematology and Oncology | Admitting: Oncology

## 2014-08-23 ENCOUNTER — Encounter (HOSPITAL_COMMUNITY): Payer: Medicare Other

## 2014-08-23 VITALS — BP 90/43 | HR 72 | Temp 98.0°F | Resp 15 | Wt 115.0 lb

## 2014-08-23 DIAGNOSIS — Z853 Personal history of malignant neoplasm of breast: Secondary | ICD-10-CM

## 2014-08-23 DIAGNOSIS — Z9889 Other specified postprocedural states: Secondary | ICD-10-CM | POA: Insufficient documentation

## 2014-08-23 DIAGNOSIS — Z0000 Encounter for general adult medical examination without abnormal findings: Secondary | ICD-10-CM

## 2014-08-23 DIAGNOSIS — C50919 Malignant neoplasm of unspecified site of unspecified female breast: Secondary | ICD-10-CM

## 2014-08-23 LAB — LIPID PANEL
Cholesterol: 131 mg/dL (ref 0–200)
HDL: 56 mg/dL
LDL Cholesterol: 54 mg/dL (ref 0–99)
Total CHOL/HDL Ratio: 2.3 ratio
Triglycerides: 107 mg/dL
VLDL: 21 mg/dL (ref 0–40)

## 2014-08-23 LAB — CBC WITH DIFFERENTIAL/PLATELET
Basophils Absolute: 0 10*3/uL (ref 0.0–0.1)
Basophils Relative: 1 % (ref 0–1)
EOS PCT: 4 % (ref 0–5)
Eosinophils Absolute: 0.1 10*3/uL (ref 0.0–0.7)
HEMATOCRIT: 37.2 % (ref 36.0–46.0)
HEMOGLOBIN: 12.4 g/dL (ref 12.0–15.0)
LYMPHS ABS: 1.1 10*3/uL (ref 0.7–4.0)
LYMPHS PCT: 28 % (ref 12–46)
MCH: 31.9 pg (ref 26.0–34.0)
MCHC: 33.3 g/dL (ref 30.0–36.0)
MCV: 95.6 fL (ref 78.0–100.0)
MONOS PCT: 14 % — AB (ref 3–12)
Monocytes Absolute: 0.6 10*3/uL (ref 0.1–1.0)
Neutro Abs: 2.1 10*3/uL (ref 1.7–7.7)
Neutrophils Relative %: 53 % (ref 43–77)
Platelets: 240 10*3/uL (ref 150–400)
RBC: 3.89 MIL/uL (ref 3.87–5.11)
RDW: 12 % (ref 11.5–15.5)
WBC: 3.9 10*3/uL — AB (ref 4.0–10.5)

## 2014-08-23 LAB — COMPREHENSIVE METABOLIC PANEL WITH GFR
ALT: 7 U/L (ref 0–35)
AST: 11 U/L (ref 0–37)
Albumin: 3.8 g/dL (ref 3.5–5.2)
Alkaline Phosphatase: 51 U/L (ref 39–117)
Anion gap: 11 (ref 5–15)
BUN: 14 mg/dL (ref 6–23)
CO2: 25 meq/L (ref 19–32)
Calcium: 9 mg/dL (ref 8.4–10.5)
Chloride: 106 meq/L (ref 96–112)
Creatinine, Ser: 0.51 mg/dL (ref 0.50–1.10)
GFR calc Af Amer: >90 mL/min
GFR calc non Af Amer: >90 mL/min
Glucose, Bld: 104 mg/dL — ABNORMAL HIGH (ref 70–99)
Potassium: 3.8 meq/L (ref 3.7–5.3)
Sodium: 142 meq/L (ref 137–147)
Total Bilirubin: 0.5 mg/dL (ref 0.3–1.2)
Total Protein: 6.7 g/dL (ref 6.0–8.3)

## 2014-08-23 LAB — T4: T4, Total: 8.1 ug/dL (ref 4.5–12.0)

## 2014-08-23 MED ORDER — HEPARIN SOD (PORK) LOCK FLUSH 100 UNIT/ML IV SOLN
INTRAVENOUS | Status: AC
  Filled 2014-08-23: qty 5

## 2014-08-23 NOTE — Patient Instructions (Signed)
[facility] Discharge Instructions  RECOMMENDATIONS MADE BY THE CONSULTANT AND ANY TEST RESULTS WILL BE SENT TO YOUR REFERRING PHYSICIAN.  EXAM FINDINGS BY THE PHYSICIAN TODAY AND SIGNS OR SYMPTOMS TO REPORT TO CLINIC OR PRIMARY PHYSICIAN: Exam and findings as discussed by Robynn Pane.  Please return here for office appt and labs in 6 months.  We recommend a bone density test.  Please call with any questions or concerns.  Thank you for choosing [facility] to provide your oncology and hematology care.  To afford each patient quality time with our providers, please arrive at least 15 minutes before your scheduled appointment time.  With your help, our goal is to use those 15 minutes to complete the necessary work-up to ensure our physicians have the information they need to help with your evaluation and healthcare recommendations.    Effective January 1st, 2014, we ask that you re-schedule your appointment with our physicians should you arrive 10 or more minutes late for your appointment.  We strive to give you quality time with our providers, and arriving late affects you and other patients whose appointments are after yours.    Again, thank you for choosing [facility].  Our hope is that these requests will decrease the amount of time that you wait before being seen by our physicians.       _____________________________________________________________  Should you have questions after your visit to [facility], please contact our office at [phone] between the hours of 8:30 a.m. and 4:30 p.m.  Voicemails left after 4:30 p.m. will not be returned until the following business day.  For prescription refill requests, have your pharmacy contact our office with your prescription refill request.    _______________________________________________________________  We hope that we have given you very good care.  You may receive a patient  satisfaction survey in the mail, please complete it and return it as soon as possible.  We value your feedback!  _______________________________________________________________  Have you asked about our STAR program?  STAR stands for Survivorship Training and Rehabilitation, and this is a nationally recognized cancer care program that focuses on survivorship and rehabilitation.  Cancer and cancer treatments may cause problems, such as, pain, making you feel tired and keeping you from doing the things that you need or want to do. Cancer rehabilitation can help. Our goal is to reduce these troubling effects and help you have the best quality of life possible.  You may receive a survey from a nurse that asks questions about your current state of health.  Based on the survey results, all eligible patients will be referred to the [facility] program for an evaluation so we can better serve you!  A frequently asked questions sheet is available upon request.

## 2014-08-24 LAB — TSH: TSH: 0.675 u[IU]/mL (ref 0.350–4.500)

## 2014-11-09 ENCOUNTER — Other Ambulatory Visit: Payer: Self-pay | Admitting: Nurse Practitioner

## 2014-12-20 ENCOUNTER — Other Ambulatory Visit (HOSPITAL_COMMUNITY): Payer: Self-pay | Admitting: Family Medicine

## 2014-12-20 DIAGNOSIS — R52 Pain, unspecified: Secondary | ICD-10-CM

## 2014-12-24 ENCOUNTER — Ambulatory Visit (HOSPITAL_COMMUNITY)
Admission: RE | Admit: 2014-12-24 | Discharge: 2014-12-24 | Disposition: A | Discharge status: Home / Self Care | Payer: Medicare Other | Source: Ambulatory Visit | Attending: Family Medicine | Admitting: Family Medicine

## 2014-12-24 ENCOUNTER — Other Ambulatory Visit (HOSPITAL_COMMUNITY): Payer: Self-pay | Admitting: Family Medicine

## 2014-12-24 DIAGNOSIS — M545 Low back pain: Secondary | ICD-10-CM | POA: Diagnosis present

## 2014-12-24 DIAGNOSIS — M5126 Other intervertebral disc displacement, lumbar region: Secondary | ICD-10-CM

## 2014-12-24 DIAGNOSIS — Z96643 Presence of artificial hip joint, bilateral: Secondary | ICD-10-CM | POA: Insufficient documentation

## 2014-12-24 DIAGNOSIS — M419 Scoliosis, unspecified: Secondary | ICD-10-CM | POA: Diagnosis not present

## 2014-12-24 DIAGNOSIS — R52 Pain, unspecified: Secondary | ICD-10-CM

## 2015-01-24 ENCOUNTER — Other Ambulatory Visit (HOSPITAL_COMMUNITY): Payer: Self-pay | Admitting: Oncology

## 2015-01-24 DIAGNOSIS — C50919 Malignant neoplasm of unspecified site of unspecified female breast: Secondary | ICD-10-CM

## 2015-01-24 MED ORDER — ESCITALOPRAM OXALATE 20 MG PO TABS: 20.0000 mg | ORAL_TABLET | Freq: Every day | ORAL | Status: DC

## 2015-02-24 ENCOUNTER — Encounter (HOSPITAL_COMMUNITY): Payer: Medicare Other | Attending: Hematology & Oncology

## 2015-02-24 ENCOUNTER — Other Ambulatory Visit (HOSPITAL_COMMUNITY): Payer: Self-pay | Admitting: Oncology

## 2015-02-24 ENCOUNTER — Encounter (HOSPITAL_COMMUNITY): Payer: Self-pay | Admitting: Oncology

## 2015-02-24 ENCOUNTER — Encounter (HOSPITAL_BASED_OUTPATIENT_CLINIC_OR_DEPARTMENT_OTHER): Payer: Medicare Other | Admitting: Oncology

## 2015-02-24 VITALS — BP 106/39 | HR 75 | Temp 97.7°F | Resp 18 | Wt 108.5 lb

## 2015-02-24 DIAGNOSIS — Z0000 Encounter for general adult medical examination without abnormal findings: Secondary | ICD-10-CM

## 2015-02-24 DIAGNOSIS — C50919 Malignant neoplasm of unspecified site of unspecified female breast: Secondary | ICD-10-CM | POA: Diagnosis present

## 2015-02-24 DIAGNOSIS — M810 Age-related osteoporosis without current pathological fracture: Secondary | ICD-10-CM

## 2015-02-24 DIAGNOSIS — Z780 Asymptomatic menopausal state: Secondary | ICD-10-CM | POA: Diagnosis not present

## 2015-02-24 DIAGNOSIS — C50912 Malignant neoplasm of unspecified site of left female breast: Secondary | ICD-10-CM | POA: Diagnosis present

## 2015-02-24 DIAGNOSIS — C773 Secondary and unspecified malignant neoplasm of axilla and upper limb lymph nodes: Secondary | ICD-10-CM | POA: Diagnosis not present

## 2015-02-24 LAB — CBC WITH DIFFERENTIAL/PLATELET
BASOS ABS: 0.1 10*3/uL (ref 0.0–0.1)
Basophils Relative: 1 % (ref 0–1)
EOS ABS: 0.2 10*3/uL (ref 0.0–0.7)
EOS PCT: 4 % (ref 0–5)
HEMATOCRIT: 38.9 % (ref 36.0–46.0)
HEMOGLOBIN: 12.5 g/dL (ref 12.0–15.0)
LYMPHS ABS: 1.5 10*3/uL (ref 0.7–4.0)
Lymphocytes Relative: 27 % (ref 12–46)
MCH: 31.7 pg (ref 26.0–34.0)
MCHC: 32.1 g/dL (ref 30.0–36.0)
MCV: 98.7 fL (ref 78.0–100.0)
MONO ABS: 0.6 10*3/uL (ref 0.1–1.0)
MONOS PCT: 10 % (ref 3–12)
Neutro Abs: 3.2 10*3/uL (ref 1.7–7.7)
Neutrophils Relative %: 58 % (ref 43–77)
Platelets: 249 10*3/uL (ref 150–400)
RBC: 3.94 MIL/uL (ref 3.87–5.11)
RDW: 12.4 % (ref 11.5–15.5)
WBC: 5.5 10*3/uL (ref 4.0–10.5)

## 2015-02-24 LAB — COMPREHENSIVE METABOLIC PANEL
ALBUMIN: 4 g/dL (ref 3.5–5.2)
ALK PHOS: 46 U/L (ref 39–117)
ALT: 11 U/L (ref 0–35)
ANION GAP: 7 (ref 5–15)
AST: 18 U/L (ref 0–37)
BILIRUBIN TOTAL: 0.4 mg/dL (ref 0.3–1.2)
BUN: 17 mg/dL (ref 6–23)
CALCIUM: 8.8 mg/dL (ref 8.4–10.5)
CO2: 25 mmol/L (ref 19–32)
Chloride: 108 mmol/L (ref 96–112)
Creatinine, Ser: 0.55 mg/dL (ref 0.50–1.10)
GFR calc Af Amer: >90 mL/min (ref >90)
GLUCOSE: 101 mg/dL — AB (ref 70–99)
POTASSIUM: 3.7 mmol/L (ref 3.5–5.1)
SODIUM: 140 mmol/L (ref 135–145)
TOTAL PROTEIN: 6.8 g/dL (ref 6.0–8.3)

## 2015-02-24 MED ORDER — SODIUM CHLORIDE 0.9 % IJ SOLN
10.0000 mL | INTRAMUSCULAR | Status: DC | PRN
  Administered 2015-02-24: 10 mL via INTRAVENOUS
  Filled 2015-02-24: qty 10

## 2015-02-24 MED ORDER — HEPARIN SOD (PORK) LOCK FLUSH 100 UNIT/ML IV SOLN
INTRAVENOUS | Status: AC
  Filled 2015-02-24: qty 5

## 2015-02-24 MED ORDER — HEPARIN SOD (PORK) LOCK FLUSH 100 UNIT/ML IV SOLN
500.0000 [IU] | Freq: Once | INTRAVENOUS | Status: AC
  Administered 2015-02-24: 500 [IU] via INTRAVENOUS

## 2015-02-24 NOTE — Assessment & Plan Note (Addendum)
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 HER2 was positive with a ratio of 2.09. Primary was 2.1 cm. She had a 2nd lesion that was 0.4 cm. Underwent mastectomy 11/27/2010. She therefore had a T2 N1 MX cancer. She has had 6 cycles of carboplatin with Taxotere and 52 weeks of Herceptin (finishing on 12/03/2011).   Labs today: CBC diff, CMET   Labs in 6 months: CBC diff, CMET.  Return in 6 months for follow-up.  Review of the patient's chart demonstrates Fosamax use without follow-up bone density exam.  I recommended a bone density examination and the patient reports that her primary care provider states that a "bone density test is not needed because she is on a bone pill."  Unfortunately, that is an inaccurate statement as Fosamax is not 100% absorbed and future follow-up is still recommended.  I will order a bone density exam within the next 6 months and decide on the best route of intervention at this time.

## 2015-02-24 NOTE — Patient Instructions (Signed)
[place] at [facility]  Discharge Instructions:  You saw Roberta Gonzalez today. Follow up in 6 months with Dr Whitney Muse.   Port flushes every 6 weeks as scheduled. We scheduled you a bone density test.   _______________________________________________________________  Thank you for choosing [place] at [facility] to provide your oncology and hematology care.  To afford each patient quality time with our providers, please arrive at least 15 minutes before your scheduled appointment.  You need to re-schedule your appointment if you arrive 10 or more minutes late.  We strive to give you quality time with our providers, and arriving late affects you and other patients whose appointments are after yours.  Also, if you no show three or more times for appointments you may be dismissed from the clinic.  Again, thank you for choosing [facility] hope is that these requests will allow you access to exceptional care and in a timely manner. _______________________________________________________________  If you have questions after your visit, please contact our office at [phone] between the hours of 8:30 a.m. and 5:00 p.m. Voicemails left after 4:30 p.m. will not be returned until the following business day. _______________________________________________________________  For prescription refill requests, have your pharmacy contact our office. _______________________________________________________________  Recommendations made by the consultant and any test results will be sent to your referring physician. _______________________________________________________________

## 2015-02-24 NOTE — Progress Notes (Signed)
Labs from port.  

## 2015-02-24 NOTE — Progress Notes (Signed)
Roberta Curet, MD [place] [address]  Invasive ductal carcinoma of breast, unspecified laterality - Plan: CBC with Differential, Comprehensive metabolic panel, heparin lock flush 100 unit/mL, sodium chloride 0.9 % injection 10 mL, CANCELED: DG Bone Density  Preventative health care - Plan: DG Bone Density, CANCELED: DG Bone Density, CANCELED: DG Bone Density  Post-menopausal - Plan: DG Bone Density  Osteoporosis - Plan: DG Bone Density  CURRENT THERAPY: Surveillance per NCCN guidelines  INTERVAL HISTORY: Roberta Gonzalez 74 y.o. 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 HER2 was positive with a ratio of 2.09. Primary was 2.1 cm. She had a 2nd lesion that was 0.4 cm. Underwent mastectomy 11/27/2010. She therefore had a T2 N1 MX cancer. She has had 6 cycles of carboplatin with Taxotere and 52 weeks of Herceptin (finishing on 12/03/2011).     Invasive ductal carcinoma of breast   10/28/2010 Initial Diagnosis Breast, left, needle core biopsy, :  - INVASIVE DUCTAL CARCINOMA.   11/27/2010 Surgery Left simple mastectomy: Multicentric invasive ductal cancer, 2.1 and 0.4 cm, +LVI, 2/8 positive lymph nodes.  ER-, PR-, HER2 3+, Ki-67 82%.   12/07/2010 - 05/03/2011 Chemotherapy Carboplatin/Taxotere x 6 cycles (Approximate dates)   12/07/2010 - 12/03/2011 Antibody Plan 52 weeks of Herceptin (Aproximate start date)   05/05/2011 Remission      I personally reviewed and went over laboratory results with the patient.  The results are noted within this dictation.  She notes that she is doing well, but with some lower leg tenderness that is chronic for her.  I will defer to her primary care provider.   On chart review, the patient has not had a bone density exam that I can see in [facility].   She is on Fosamax by her primary care provider, Dr. [name].  The patient reports that her primary care provider says "she does not need another bone density test because she is on a bone pill."  Unfortunately, that is an inaccurate statement as approximately 20% of Fosamax is absorbed.  I will order the test because it is the right thing to do for the patient and I will fax the results to her primary care provider for addressing.  She is not on an aromatase inhibitor and therefore, oncologically, she is not at increased risk for progressive osteoporosis.    She reports that she is feeling well.  Oncologically, she denies any complaints and ROS questioning is negative.  Past Medical History  Diagnosis Date  . GERD (gastroesophageal reflux disease) 06/10/2011  . Chest pain 06/10/2011  . Port catheter in place 11/29/2012  . Hypercholesteremia   . Depression   . Neuropathy   . Cancer     jan 2012/mastectomy L; chemo/rad tx  . 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 Breast cancer     right in 2000 in [place] and underwent a mastectomy.    has Invasive ductal carcinoma of breast; GERD (gastroesophageal reflux disease); Chest pain; Port catheter in place; Personal history of colonic polyps; and Family hx of colon cancer on her problem list.     is allergic to codeine; percodan; and sulfa antibiotics.  We administered heparin lock flush and sodium chloride.  Past Surgical History  Procedure Laterality Date  . Mastectomy    .  Joint replacement    . Hip replacements    . Cranial shunt    . Abdominal hysterectomy    . Tonsillectomy    . Cataract extraction w/phaco Right 04/02/2013    Procedure: CATARACT EXTRACTION PHACO AND INTRAOCULAR LENS PLACEMENT (IOC);  Surgeon: [name], MD;  Location: [facility];  Service: Ophthalmology;  Laterality: Right;  CDE 22.44  . Cataract extraction w/phaco Left 04/26/2013    Procedure: CATARACT EXTRACTION PHACO AND INTRAOCULAR LENS PLACEMENT (IOC);   Surgeon: [name], MD;  Location: [facility];  Service: Ophthalmology;  Laterality: Left;  CDE: 23.60  . Esophagogastroduodenoscopy (egd) with esophageal dilation N/A 05/03/2013    Procedure: ESOPHAGOGASTRODUODENOSCOPY (EGD) WITH ESOPHAGEAL DILATION;  Surgeon: [name], MD;  Location: [facility];  Service: Endoscopy;  Laterality: N/A;  145-moved to 100 Ann to notify pt  . Repair of fractured penis Right last wednesday    Dr. Luna Glasgow  . Colonoscopy N/A 04/05/2014    Procedure: COLONOSCOPY;  Surgeon: [name], MD;  Location: [facility];  Service: Endoscopy;  Laterality: N/A;  11:15    Denies any headaches, dizziness, double vision, fevers, chills, night sweats, nausea, vomiting, diarrhea, constipation, chest pain, heart palpitations, shortness of breath, blood in stool, black tarry stool, urinary pain, urinary burning, urinary frequency, hematuria.   PHYSICAL EXAMINATION  ECOG PERFORMANCE STATUS: 0 - Asymptomatic  Filed Vitals:   02/24/15 0900  BP: 106/39  Pulse: 75  Temp: 97.7 F (36.5 C)  Resp: 18    GENERAL:alert, no distress, well nourished, well developed, comfortable, cooperative, smiling and accompanied by daughter and grandson SKIN: skin color, texture, turgor are normal, no rashes or significant lesions HEAD: Normocephalic, No masses, lesions, tenderness or abnormalities EYES: normal, PERRLA, EOMI, Conjunctiva are pink and non-injected EARS: External ears normal OROPHARYNX:lips, buccal mucosa, and tongue normal and mucous membranes are moist  NECK: supple, no adenopathy, thyroid normal size, non-tender, without nodularity, no stridor, non-tender, trachea midline LYMPH:  no palpable lymphadenopathy BREAST:post-mastectomy bilaterally site well healed and free of suspicious changes LUNGS: clear to auscultation  HEART: regular rate & rhythm, no murmurs, no gallops, S1 normal and S2 normal ABDOMEN:abdomen soft, non-tender, normal bowel sounds and no masses or  organomegaly BACK: Back symmetric, no curvature., No CVA tenderness EXTREMITIES:less then 2 second capillary refill, no joint deformities, effusion, or inflammation, no edema, no skin discoloration, no clubbing, no cyanosis  NEURO: alert & oriented x 3 with fluent speech, no focal motor/sensory deficits, gait normal   LABORATORY DATA: CBC    Component Value Date/Time   WBC 5.5 02/24/2015 0952   RBC 3.94 02/24/2015 0952   HGB 12.5 02/24/2015 0952   HCT 38.9 02/24/2015 0952   PLT 249 02/24/2015 0952   MCV 98.7 02/24/2015 0952   MCH 31.7 02/24/2015 0952   MCHC 32.1 02/24/2015 0952   RDW 12.4 02/24/2015 0952   LYMPHSABS 1.5 02/24/2015 0952   MONOABS 0.6 02/24/2015 0952   EOSABS 0.2 02/24/2015 0952   BASOSABS 0.1 02/24/2015 0952      Chemistry      Component Value Date/Time   NA 140 02/24/2015 0952   K 3.7 02/24/2015 0952   CL 108 02/24/2015 0952   CO2 25 02/24/2015 0952   BUN 17 02/24/2015 0952   CREATININE 0.55 02/24/2015 0952      Component Value Date/Time   CALCIUM 8.8 02/24/2015 0952   ALKPHOS 46 02/24/2015 0952   AST 18 02/24/2015 0952   ALT 11 02/24/2015 0952  BILITOT 0.4 02/24/2015 5051        ASSESSMENT AND PLAN:  Invasive ductal carcinoma of breast Stage II, 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 HER2 was positive with a ratio of 2.09. Primary was 2.1 cm. She had a 2nd lesion that was 0.4 cm. Underwent mastectomy 11/27/2010. She therefore had a T2 N1 MX cancer. She has had 6 cycles of carboplatin with Taxotere and 52 weeks of Herceptin (finishing on 12/03/2011).   Labs today: CBC diff, CMET   Labs in 6 months: CBC diff, CMET.  Return in 6 months for follow-up.  Review of the patient's chart demonstrates Fosamax use without follow-up bone density exam.  I recommended a bone density examination and the  patient reports that her primary care provider states that a "bone density test is not needed because she is on a bone pill."  Unfortunately, that is an inaccurate statement as Fosamax is not 100% absorbed and future follow-up is still recommended.  I will order a bone density exam within the next 6 months and decide on the best route of intervention at this time.   THERAPY PLAN:  NCCN guidelines recommends the following surveillance for invasive breast cancer:  A. History and Physical exam every 4-6 months for 5 years and then every 12 months.  B. Mammography every 12 months  C. Women on Tamoxifen: annual gynecologic assessment every 12 months if uterus is present.  D. Women on aromatase inhibitor or who experience ovarian failure secondary to treatment should have monitoring of bone health with a bone mineral density determination at baseline and periodically thereafter.  E. Assess and encourage adherence to adjuvant endocrine therapy.  F. Evidence suggests that active lifestyle and achieving and maintaining an ideal body weight (20-25 BMI) may lead to optimal breast cancer outcomes.   All questions were answered. The patient knows to call the clinic with any problems, questions or concerns. We can certainly see the patient much sooner if necessary.  Patient and plan discussed with Dr. Ancil Linsey and she is in agreement with the aforementioned.   This note is electronically signed by: Robynn Pane 02/24/2015 10:27 AM

## 2015-02-24 NOTE — Progress Notes (Signed)
Roberta Gonzalez presented for Portacath access and flush.  Proper placement of portacath confirmed by CXR.  Portacath located right chest wall accessed with  H 20 needle.  Good blood return present. Portacath flushed with 21ml NS and 500U/46ml Heparin and needle removed intact.  Procedure tolerated well and without incident.

## 2015-03-10 ENCOUNTER — Other Ambulatory Visit (HOSPITAL_COMMUNITY): Payer: Self-pay | Admitting: Oncology

## 2015-03-10 DIAGNOSIS — K219 Gastro-esophageal reflux disease without esophagitis: Secondary | ICD-10-CM

## 2015-03-10 MED ORDER — SUCRALFATE 1 GM/10ML PO SUSP: 1.0000 g | Freq: Every day | ORAL | Status: DC | PRN

## 2015-03-25 ENCOUNTER — Encounter (INDEPENDENT_AMBULATORY_CARE_PROVIDER_SITE_OTHER): Payer: Self-pay | Admitting: *Deleted

## 2015-04-07 ENCOUNTER — Encounter (HOSPITAL_COMMUNITY): Payer: Medicare Other | Attending: Hematology & Oncology

## 2015-04-07 ENCOUNTER — Encounter (HOSPITAL_COMMUNITY): Payer: Self-pay

## 2015-04-07 VITALS — BP 92/34 | HR 84 | Temp 98.4°F | Resp 20

## 2015-04-07 DIAGNOSIS — Z853 Personal history of malignant neoplasm of breast: Secondary | ICD-10-CM | POA: Diagnosis not present

## 2015-04-07 DIAGNOSIS — Z452 Encounter for adjustment and management of vascular access device: Secondary | ICD-10-CM

## 2015-04-07 DIAGNOSIS — C50919 Malignant neoplasm of unspecified site of unspecified female breast: Secondary | ICD-10-CM | POA: Insufficient documentation

## 2015-04-07 DIAGNOSIS — Z95828 Presence of other vascular implants and grafts: Secondary | ICD-10-CM

## 2015-04-07 DIAGNOSIS — M810 Age-related osteoporosis without current pathological fracture: Secondary | ICD-10-CM | POA: Insufficient documentation

## 2015-04-07 DIAGNOSIS — Z780 Asymptomatic menopausal state: Secondary | ICD-10-CM | POA: Insufficient documentation

## 2015-04-07 MED ORDER — SODIUM CHLORIDE 0.9 % IJ SOLN
10.0000 mL | INTRAMUSCULAR | Status: DC | PRN
  Administered 2015-04-07: 10 mL via INTRAVENOUS
  Filled 2015-04-07: qty 10

## 2015-04-07 MED ORDER — HEPARIN SOD (PORK) LOCK FLUSH 100 UNIT/ML IV SOLN
500.0000 [IU] | Freq: Once | INTRAVENOUS | Status: AC
  Administered 2015-04-07: 500 [IU] via INTRAVENOUS
  Filled 2015-04-07: qty 5

## 2015-04-07 NOTE — Progress Notes (Signed)
Roberta Gonzalez presented for Portacath access and flush.  Proper placement of portacath confirmed by CXR.  Portacath located right chest wall accessed with  H 20 needle.  Good blood return present. Portacath flushed with 41ml NS and 500U/13ml Heparin and needle removed intact.  Procedure tolerated well and without incident.

## 2015-04-07 NOTE — Patient Instructions (Signed)
[place] at [facility]  Discharge Instructions:  Port flush today Follow up as scheduled Please call the clinic if you have any questions or concerns _______________________________________________________________  Thank you for choosing [place] at [facility] to provide your oncology and hematology care.  To afford each patient quality time with our providers, please arrive at least 15 minutes before your scheduled appointment.  You need to re-schedule your appointment if you arrive 10 or more minutes late.  We strive to give you quality time with our providers, and arriving late affects you and other patients whose appointments are after yours.  Also, if you no show three or more times for appointments you may be dismissed from the clinic.  Again, thank you for choosing [place] at [facility] hope is that these requests will allow you access to exceptional care and in a timely manner. _______________________________________________________________  If you have questions after your visit, please contact our office at [phone] between the hours of 8:30 a.m. and 5:00 p.m. Voicemails left after 4:30 p.m. will not be returned until the following business day. _______________________________________________________________  For prescription refill requests, have your pharmacy contact our office. _______________________________________________________________  Recommendations made by the consultant and any test results will be sent to your referring physician. _______________________________________________________________

## 2015-05-02 ENCOUNTER — Ambulatory Visit (HOSPITAL_COMMUNITY)
Admission: RE | Admit: 2015-05-02 | Discharge: 2015-05-02 | Disposition: A | Discharge status: Home / Self Care | Payer: Medicare Other | Source: Ambulatory Visit | Attending: Oncology | Admitting: Oncology

## 2015-05-02 DIAGNOSIS — Z780 Asymptomatic menopausal state: Secondary | ICD-10-CM | POA: Diagnosis not present

## 2015-05-02 DIAGNOSIS — M810 Age-related osteoporosis without current pathological fracture: Secondary | ICD-10-CM | POA: Diagnosis not present

## 2015-05-02 DIAGNOSIS — Z0000 Encounter for general adult medical examination without abnormal findings: Secondary | ICD-10-CM

## 2015-05-19 ENCOUNTER — Encounter (HOSPITAL_COMMUNITY): Payer: Self-pay

## 2015-05-19 ENCOUNTER — Encounter (HOSPITAL_COMMUNITY): Payer: Medicare Other | Attending: Hematology & Oncology

## 2015-05-19 VITALS — BP 90/34 | HR 79 | Temp 98.2°F | Resp 20

## 2015-05-19 DIAGNOSIS — C50912 Malignant neoplasm of unspecified site of left female breast: Secondary | ICD-10-CM | POA: Diagnosis not present

## 2015-05-19 DIAGNOSIS — C773 Secondary and unspecified malignant neoplasm of axilla and upper limb lymph nodes: Secondary | ICD-10-CM

## 2015-05-19 DIAGNOSIS — Z452 Encounter for adjustment and management of vascular access device: Secondary | ICD-10-CM

## 2015-05-19 DIAGNOSIS — Z95828 Presence of other vascular implants and grafts: Secondary | ICD-10-CM

## 2015-05-19 MED ORDER — HEPARIN SOD (PORK) LOCK FLUSH 100 UNIT/ML IV SOLN
500.0000 [IU] | Freq: Once | INTRAVENOUS | Status: AC
  Administered 2015-05-19: 500 [IU] via INTRAVENOUS
  Filled 2015-05-19: qty 5

## 2015-05-19 MED ORDER — SODIUM CHLORIDE 0.9 % IJ SOLN
10.0000 mL | INTRAMUSCULAR | Status: DC | PRN
  Administered 2015-05-19: 10 mL via INTRAVENOUS
  Filled 2015-05-19: qty 10

## 2015-05-19 NOTE — Patient Instructions (Signed)
[place] at [facility] Discharge Instructions  RECOMMENDATIONS MADE BY THE CONSULTANT AND ANY TEST RESULTS WILL BE SENT TO YOUR REFERRING PHYSICIAN. You got your port flushed today. Will see you at your next appointment.  Thank you for choosing [place] at [facility] to provide your oncology and hematology care.  To afford each patient quality time with our provider, please arrive at least 15 minutes before your scheduled appointment time.    You need to re-schedule your appointment should you arrive 10 or more minutes late.  We strive to give you quality time with our providers, and arriving late affects you and other patients whose appointments are after yours.  Also, if you no show three or more times for appointments you may be dismissed from the clinic at the providers discretion.     Again, thank you for choosing [facility].  Our hope is that these requests will decrease the amount of time that you wait before being seen by our physicians.       _____________________________________________________________  Should you have questions after your visit to [facility], please contact our office at [phone] between the hours of 8:30 a.m. and 4:30 p.m.  Voicemails left after 4:30 p.m. will not be returned until the following business day.  For prescription refill requests, have your pharmacy contact our office.

## 2015-05-19 NOTE — Progress Notes (Signed)
Roberta Gonzalez presented for Portacath access and flush. Portacath located right  chest wall accessed with  H 20 needle. Good blood return present. Portacath flushed with 59ml NS and 500U/65ml Heparin and needle removed intact. Procedure without incident. Patient tolerated procedure well.

## 2015-06-30 ENCOUNTER — Encounter (HOSPITAL_COMMUNITY): Payer: Medicare Other | Attending: Hematology & Oncology

## 2015-06-30 VITALS — BP 110/40 | HR 75 | Temp 98.9°F | Resp 18

## 2015-06-30 DIAGNOSIS — C50912 Malignant neoplasm of unspecified site of left female breast: Secondary | ICD-10-CM

## 2015-06-30 DIAGNOSIS — Z95828 Presence of other vascular implants and grafts: Secondary | ICD-10-CM

## 2015-06-30 DIAGNOSIS — C773 Secondary and unspecified malignant neoplasm of axilla and upper limb lymph nodes: Secondary | ICD-10-CM | POA: Diagnosis not present

## 2015-06-30 DIAGNOSIS — Z452 Encounter for adjustment and management of vascular access device: Secondary | ICD-10-CM

## 2015-06-30 DIAGNOSIS — C50919 Malignant neoplasm of unspecified site of unspecified female breast: Secondary | ICD-10-CM | POA: Insufficient documentation

## 2015-06-30 DIAGNOSIS — M810 Age-related osteoporosis without current pathological fracture: Secondary | ICD-10-CM | POA: Insufficient documentation

## 2015-06-30 DIAGNOSIS — Z780 Asymptomatic menopausal state: Secondary | ICD-10-CM | POA: Insufficient documentation

## 2015-06-30 MED ORDER — HEPARIN SOD (PORK) LOCK FLUSH 100 UNIT/ML IV SOLN
500.0000 [IU] | Freq: Once | INTRAVENOUS | Status: AC
  Administered 2015-06-30: 500 [IU] via INTRAVENOUS
  Filled 2015-06-30: qty 5

## 2015-06-30 MED ORDER — SODIUM CHLORIDE 0.9 % IJ SOLN
10.0000 mL | Freq: Once | INTRAMUSCULAR | Status: AC
  Administered 2015-06-30: 10 mL via INTRAVENOUS

## 2015-06-30 NOTE — Patient Instructions (Signed)
[facility] at [facility] Discharge Instructions  RECOMMENDATIONS MADE BY THE CONSULTANT AND ANY TEST RESULTS WILL BE SENT TO YOUR REFERRING PHYSICIAN.  Port flush done today as scheduled. Return as scheduled.  Thank you for choosing [facility] at [facility] to provide your oncology and hematology care.  To afford each patient quality time with our provider, please arrive at least 15 minutes before your scheduled appointment time.    You need to re-schedule your appointment should you arrive 10 or more minutes late.  We strive to give you quality time with our providers, and arriving late affects you and other patients whose appointments are after yours.  Also, if you no show three or more times for appointments you may be dismissed from the clinic at the providers discretion.     Again, thank you for choosing [facility].  Our hope is that these requests will decrease the amount of time that you wait before being seen by our physicians.       _____________________________________________________________  Should you have questions after your visit to [facility], please contact our office at [phone] between the hours of 8:30 a.m. and 4:30 p.m.  Voicemails left after 4:30 p.m. will not be returned until the following business day.  For prescription refill requests, have your pharmacy contact our office.    

## 2015-06-30 NOTE — Progress Notes (Signed)
Roberta Gonzalez presented for Portacath access and flush. Proper placement of portacath confirmed by CXR. Portacath located right chest wall accessed with  H 20 needle. Good blood return present. Portacath flushed with 1ml NS and 500U/22ml Heparin and needle removed intact. Procedure without incident. Patient tolerated procedure well.

## 2015-08-11 ENCOUNTER — Encounter (HOSPITAL_COMMUNITY): Payer: Medicare Other

## 2015-08-25 ENCOUNTER — Encounter (HOSPITAL_COMMUNITY): Payer: Medicare Other | Attending: Hematology & Oncology | Admitting: Hematology & Oncology

## 2015-08-25 ENCOUNTER — Encounter (HOSPITAL_COMMUNITY): Payer: Medicare Other

## 2015-08-25 ENCOUNTER — Encounter (HOSPITAL_COMMUNITY): Payer: Self-pay | Admitting: Hematology & Oncology

## 2015-08-25 DIAGNOSIS — Z780 Asymptomatic menopausal state: Secondary | ICD-10-CM | POA: Diagnosis not present

## 2015-08-25 DIAGNOSIS — C50919 Malignant neoplasm of unspecified site of unspecified female breast: Secondary | ICD-10-CM | POA: Diagnosis not present

## 2015-08-25 DIAGNOSIS — C50912 Malignant neoplasm of unspecified site of left female breast: Secondary | ICD-10-CM | POA: Diagnosis present

## 2015-08-25 DIAGNOSIS — M810 Age-related osteoporosis without current pathological fracture: Secondary | ICD-10-CM | POA: Diagnosis not present

## 2015-08-25 DIAGNOSIS — C773 Secondary and unspecified malignant neoplasm of axilla and upper limb lymph nodes: Secondary | ICD-10-CM

## 2015-08-25 LAB — COMPREHENSIVE METABOLIC PANEL
ALBUMIN: 3.9 g/dL (ref 3.5–5.0)
ALT: 12 U/L — ABNORMAL LOW (ref 14–54)
ANION GAP: 4 — AB (ref 5–15)
AST: 17 U/L (ref 15–41)
Alkaline Phosphatase: 36 U/L — ABNORMAL LOW (ref 38–126)
BILIRUBIN TOTAL: 0.4 mg/dL (ref 0.3–1.2)
BUN: 11 mg/dL (ref 6–20)
CO2: 27 mmol/L (ref 22–32)
Calcium: 8.5 mg/dL — ABNORMAL LOW (ref 8.9–10.3)
Chloride: 107 mmol/L (ref 101–111)
Creatinine, Ser: 0.47 mg/dL (ref 0.44–1.00)
GFR calc Af Amer: >60 mL/min (ref >60)
GLUCOSE: 94 mg/dL (ref 65–99)
POTASSIUM: 3.7 mmol/L (ref 3.5–5.1)
Sodium: 138 mmol/L (ref 135–145)
TOTAL PROTEIN: 6.6 g/dL (ref 6.5–8.1)

## 2015-08-25 LAB — CBC WITH DIFFERENTIAL/PLATELET
BASOS ABS: 0.1 10*3/uL (ref 0.0–0.1)
Basophils Relative: 1 %
EOS ABS: 0.2 10*3/uL (ref 0.0–0.7)
Eosinophils Relative: 4 %
HCT: 36.1 % (ref 36.0–46.0)
HEMOGLOBIN: 12 g/dL (ref 12.0–15.0)
LYMPHS ABS: 1.6 10*3/uL (ref 0.7–4.0)
LYMPHS PCT: 30 %
MCH: 32.2 pg (ref 26.0–34.0)
MCHC: 33.2 g/dL (ref 30.0–36.0)
MCV: 96.8 fL (ref 78.0–100.0)
Monocytes Absolute: 0.7 10*3/uL (ref 0.1–1.0)
Monocytes Relative: 12 %
NEUTROS PCT: 53 %
Neutro Abs: 2.9 10*3/uL (ref 1.7–7.7)
Platelets: 255 10*3/uL (ref 150–400)
RBC: 3.73 MIL/uL — AB (ref 3.87–5.11)
RDW: 12.6 % (ref 11.5–15.5)
WBC: 5.4 10*3/uL (ref 4.0–10.5)

## 2015-08-25 MED ORDER — HEPARIN SOD (PORK) LOCK FLUSH 100 UNIT/ML IV SOLN
INTRAVENOUS | Status: AC
  Filled 2015-08-25: qty 5

## 2015-08-25 MED ORDER — SODIUM CHLORIDE 0.9 % IJ SOLN
10.0000 mL | INTRAMUSCULAR | Status: DC | PRN
  Administered 2015-08-25: 10 mL via INTRAVENOUS
  Filled 2015-08-25: qty 10

## 2015-08-25 MED ORDER — HEPARIN SOD (PORK) LOCK FLUSH 100 UNIT/ML IV SOLN
500.0000 [IU] | Freq: Once | INTRAVENOUS | Status: AC
  Administered 2015-08-25: 500 [IU] via INTRAVENOUS

## 2015-08-25 NOTE — Progress Notes (Signed)
See office visit encounter. 

## 2015-08-25 NOTE — Patient Instructions (Signed)
..  [place] at [facility] Discharge Instructions  RECOMMENDATIONS MADE BY THE CONSULTANT AND ANY TEST RESULTS WILL BE SENT TO YOUR REFERRING PHYSICIAN.  Port flush today  Calcium 1200 mg daily and at least 2000 units vitamin d  Return to see [place] in 6 months  Thank you for choosing [place] at [facility] to provide your oncology and hematology care.  To afford each patient quality time with our provider, please arrive at least 15 minutes before your scheduled appointment time.    You need to re-schedule your appointment should you arrive 10 or more minutes late.  We strive to give you quality time with our providers, and arriving late affects you and other patients whose appointments are after yours.  Also, if you no show three or more times for appointments you may be dismissed from the clinic at the providers discretion.     Again, thank you for choosing [facility].  Our hope is that these requests will decrease the amount of time that you wait before being seen by our physicians.       _____________________________________________________________  Should you have questions after your visit to [facility], please contact our office at [phone] between the hours of 8:30 a.m. and 4:30 p.m.  Voicemails left after 4:30 p.m. will not be returned until the following business day.  For prescription refill requests, have your pharmacy contact our office.

## 2015-08-25 NOTE — Progress Notes (Signed)
..  Kenady J Metheny presented for Portacath access and flush.   Portacath located rt chest wall accessed with  H 20 needle.  Good blood return present. Portacath flushed with 35ml NS and 500U/82ml Heparin and needle removed intact.  Procedure tolerated well and without incident.

## 2015-08-25 NOTE — Progress Notes (Signed)
Roberta Gonzalez, [place] [address]  Invasive ductal carcinoma of breast, unspecified laterality ([place]) - Plan: CBC with Differential, Comprehensive metabolic panel, heparin lock flush 100 unit/mL, sodium chloride 0.9 % injection 10 mL  CURRENT THERAPY: Surveillance per NCCN guidelines  INTERVAL HISTORY: [name] 74 y.o. 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 HER2 was positive with a ratio of 2.09. Primary was 2.1 cm. She had a 2nd lesion that was 0.4 cm. Underwent mastectomy 11/27/2010. She therefore had a T2 N1 MX cancer. She has had 6 cycles of carboplatin with Taxotere and 52 weeks of Herceptin (finishing on 12/03/2011).     Invasive ductal carcinoma of breast ([place])   10/28/2010 Initial Diagnosis Breast, left, needle core biopsy, :  - INVASIVE DUCTAL CARCINOMA.   11/27/2010 Surgery Left simple mastectomy: Multicentric invasive ductal cancer, 2.1 and 0.4 cm, +LVI, 2/8 positive lymph nodes.  ER-, PR-, HER2 3+, Ki-67 82%.   12/07/2010 - 05/03/2011 Chemotherapy Carboplatin/Taxotere x 6 cycles (Approximate dates)   12/07/2010 - 12/03/2011 Antibody Plan 52 weeks of Herceptin (Aproximate start date)   05/05/2011 Remission      I personally reviewed and went over laboratory results with the patient.  The results are noted within this dictation.  Ms. Roberta Gonzalez is here today with her youngest daughter, who also appears to be her caregiver.   Her PCP is [place].  She has osteoporosis, but she does not take calcium and vitamin D every day; she takes a prenatal vitamin. She has been on fosamax.  Her teeth are gone. She is not active. She's had both of her hips replaced, has arthritis, and has neuropathy. Her ambulation and movement are very limited due to her  hips.  When asked if she's having any problems, she says with some humor: "my whole body." She comments that she gets burning sensations in her back, and sometimes "it feels like I've got bumps in there. They said I have bone spurs." She says these symptoms can be alleviated by putting a heating bad on her back or by taking ibuprofen, both of which help.  She also reports that, with regards to her past cancer and double mastectomy, she was on chemo pills for a while.  Her daughter says her bowels are not good, but [name] says she's "always been that way" with regards to her constipation. She has had a colonoscopy. She sees Dr. Laural Golden for GI concerns.  She needs a port flush.   Past Medical History  Diagnosis Date  . GERD (gastroesophageal reflux disease) 06/10/2011  . Chest pain 06/10/2011  . Port catheter in place 11/29/2012  . Hypercholesteremia   . Depression   . Neuropathy ([place])   . Cancer ([place])     jan 2012/mastectomy L; chemo/rad tx  . Invasive ductal carcinoma of breast ([place]) 06/10/2011  . Breast cancer ([place])     right in 2000 in [place] and underwent a mastectomy.    has Invasive ductal carcinoma of breast ([place]); GERD (gastroesophageal reflux disease); Chest pain; Port catheter in place; Personal history of colonic polyps; and Family hx of colon cancer on her problem list.     is allergic to codeine; percodan; and sulfa antibiotics.  We administered heparin lock flush and sodium chloride.  Past Surgical History  Procedure Laterality Date  . Mastectomy    . Joint replacement    . Hip replacements    . Cranial shunt    . Abdominal hysterectomy    . Tonsillectomy    . Cataract extraction w/phaco Right 04/02/2013    Procedure: CATARACT EXTRACTION PHACO AND INTRAOCULAR LENS PLACEMENT (IOC);  Surgeon: [name], MD;  Location: [facility];  Service: Ophthalmology;  Laterality: Right;  CDE 22.44  . Cataract extraction w/phaco Left 04/26/2013    Procedure: CATARACT EXTRACTION  PHACO AND INTRAOCULAR LENS PLACEMENT (IOC);  Surgeon: [name], MD;  Location: [facility];  Service: Ophthalmology;  Laterality: Left;  CDE: 23.60  . Esophagogastroduodenoscopy (egd) with esophageal dilation N/A 05/03/2013    Procedure: ESOPHAGOGASTRODUODENOSCOPY (EGD) WITH ESOPHAGEAL DILATION;  Surgeon: [name], MD;  Location: [facility];  Service: Endoscopy;  Laterality: N/A;  145-moved to 100 Ann to notify pt  . Repair of fractured penis Right last wednesday    Dr. Luna Glasgow  . Colonoscopy N/A 04/05/2014    Procedure: COLONOSCOPY;  Surgeon: [name], MD;  Location: [facility];  Service: Endoscopy;  Laterality: N/A;  11:15   Social History She was born in [place]. She has two daughters, five grandchildren, and two great-grandchildren. Her youngest daughter lives with her, along with her grandson.   Review of Systems  Denies any headaches, dizziness, double vision, fevers, chills, night sweats, nausea, vomiting, diarrhea, constipation, chest pain, heart palpitations, shortness of breath, blood in stool, black tarry stool, urinary pain, urinary burning, urinary frequency, hematuria.  14 point review of systems was performed and is negative except as detailed under history of present illness and above    PHYSICAL EXAMINATION  ECOG PERFORMANCE STATUS: 0 - Asymptomatic  Filed Vitals:   08/25/15 0954  BP: 106/45  Pulse: 75  Temp: 97.8 F (36.6 C)  Resp: 18    GENERAL:alert, no distress, well nourished, well developed, comfortable, cooperative, smiling and accompanied by daughter and grandson SKIN: skin color, texture, turgor are normal, no rashes or significant lesions HEAD: Normocephalic, No masses, lesions, tenderness or abnormalities EYES: normal, PERRLA, EOMI, Conjunctiva are pink and non-injected EARS: External ears normal OROPHARYNX:lips, buccal mucosa, and tongue normal and mucous membranes are moist  NECK: supple, no adenopathy, thyroid normal size,  non-tender, without nodularity, no stridor, non-tender, trachea midline LYMPH:  no palpable lymphadenopathy BREAST:post-mastectomy bilaterally site well healed  Bilateral mastectomies; no palpable nodules  Left side has radiation changes, angioectasias LUNGS: clear to auscultation  HEART: regular rate & rhythm, no murmurs, no gallops, S1 normal and S2 normal ABDOMEN:abdomen soft, non-tender, normal bowel sounds and no masses or organomegaly BACK: Back symmetric, no curvature., No CVA tenderness EXTREMITIES:less then 2 second capillary refill, no joint deformities, effusion, or inflammation, no edema, no skin discoloration, no clubbing, no cyanosis  NEURO: alert & oriented x 3 with fluent speech, no focal motor/sensory deficits, gait slow, abnormal   LABORATORY DATA: I have reviewed the data as listed.  CBC    Component Value Date/Time   WBC 5.4 08/25/2015 1155   RBC 3.73* 08/25/2015 1155   HGB 12.0 08/25/2015 1155   HCT 36.1 08/25/2015 1155   PLT 255 08/25/2015 1155   MCV 96.8 08/25/2015 1155   MCH 32.2 08/25/2015 1155   MCHC 33.2 08/25/2015 1155   RDW 12.6 08/25/2015 1155   LYMPHSABS 1.6 08/25/2015 1155   MONOABS 0.7 08/25/2015 1155   EOSABS 0.2 08/25/2015 1155   BASOSABS 0.1  08/25/2015 1155      Chemistry      Component Value Date/Time   NA 138 08/25/2015 1155   K 3.7 08/25/2015 1155   CL 107 08/25/2015 1155   CO2 27 08/25/2015 1155   BUN 11 08/25/2015 1155   CREATININE 0.47 08/25/2015 1155      Component Value Date/Time   CALCIUM 8.5* 08/25/2015 1155   ALKPHOS 36* 08/25/2015 1155   AST 17 08/25/2015 1155   ALT 12* 08/25/2015 1155   BILITOT 0.4 08/25/2015 1155      ASSESSMENT AND PLAN:  Stage II, 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 She continues on Fosamax for her osteoporosis. This is managed by her primary care  doctor. I have advised her to take calcium 1200 mg daily and between 1000 2000 international units of vitamin D.  She will return in 6 months for ongoing follow-up.  THERAPY PLAN:  NCCN guidelines recommends the following surveillance for invasive breast cancer:  A. History and Physical exam every 4-6 months for 5 years and then every 12 months.  B. Mammography every 12 months  C. Women on Tamoxifen: annual gynecologic assessment every 12 months if uterus is present.  D. Women on aromatase inhibitor or who experience ovarian failure secondary to treatment should have monitoring of bone health with a bone mineral density determination at baseline and periodically thereafter.  E. Assess and encourage adherence to adjuvant endocrine therapy.  F. Evidence suggests that active lifestyle and achieving and maintaining an ideal body weight (20-25 BMI) may lead to optimal breast cancer outcomes.   We will see her back in 6 months.  All questions were answered. The patient knows to call the clinic with any problems, questions or concerns. We can certainly see the patient much sooner if necessary.  This document serves as a record of services personally performed by Ancil Linsey, MD. It was created on her behalf by Toni Amend, a trained medical scribe. The creation of this record is based on the scribe's personal observations and the provider's statements to them. This document has been checked and approved by the attending provider.  I have reviewed the above documentation for accuracy and completeness, and I agree with the above.  This note is electronically signed [email or web address] Cyril Mourning, MD  08/25/2015 1:33 PM

## 2015-09-20 ENCOUNTER — Emergency Department (HOSPITAL_COMMUNITY)
Admission: EM | Admit: 2015-09-20 | Discharge: 2015-09-20 | Disposition: A | Discharge status: Home / Self Care | Payer: Medicare Other | Attending: Emergency Medicine | Admitting: Emergency Medicine

## 2015-09-20 ENCOUNTER — Emergency Department (HOSPITAL_COMMUNITY): Payer: Medicare Other

## 2015-09-20 ENCOUNTER — Encounter (HOSPITAL_COMMUNITY): Payer: Self-pay | Admitting: *Deleted

## 2015-09-20 DIAGNOSIS — Z95828 Presence of other vascular implants and grafts: Secondary | ICD-10-CM | POA: Insufficient documentation

## 2015-09-20 DIAGNOSIS — Y939 Activity, unspecified: Secondary | ICD-10-CM | POA: Insufficient documentation

## 2015-09-20 DIAGNOSIS — S3992XA Unspecified injury of lower back, initial encounter: Secondary | ICD-10-CM | POA: Diagnosis not present

## 2015-09-20 DIAGNOSIS — K219 Gastro-esophageal reflux disease without esophagitis: Secondary | ICD-10-CM | POA: Insufficient documentation

## 2015-09-20 DIAGNOSIS — Z79899 Other long term (current) drug therapy: Secondary | ICD-10-CM | POA: Insufficient documentation

## 2015-09-20 DIAGNOSIS — Z8669 Personal history of other diseases of the nervous system and sense organs: Secondary | ICD-10-CM | POA: Diagnosis not present

## 2015-09-20 DIAGNOSIS — Y929 Unspecified place or not applicable: Secondary | ICD-10-CM | POA: Insufficient documentation

## 2015-09-20 DIAGNOSIS — K5909 Other constipation: Secondary | ICD-10-CM | POA: Insufficient documentation

## 2015-09-20 DIAGNOSIS — W1839XA Other fall on same level, initial encounter: Secondary | ICD-10-CM | POA: Diagnosis not present

## 2015-09-20 DIAGNOSIS — F329 Major depressive disorder, single episode, unspecified: Secondary | ICD-10-CM | POA: Diagnosis not present

## 2015-09-20 DIAGNOSIS — Z87891 Personal history of nicotine dependence: Secondary | ICD-10-CM | POA: Diagnosis not present

## 2015-09-20 DIAGNOSIS — Z853 Personal history of malignant neoplasm of breast: Secondary | ICD-10-CM | POA: Insufficient documentation

## 2015-09-20 DIAGNOSIS — E7800 Pure hypercholesterolemia, unspecified: Secondary | ICD-10-CM | POA: Insufficient documentation

## 2015-09-20 DIAGNOSIS — W19XXXA Unspecified fall, initial encounter: Secondary | ICD-10-CM

## 2015-09-20 DIAGNOSIS — Z7951 Long term (current) use of inhaled steroids: Secondary | ICD-10-CM | POA: Diagnosis not present

## 2015-09-20 DIAGNOSIS — Y998 Other external cause status: Secondary | ICD-10-CM | POA: Insufficient documentation

## 2015-09-20 DIAGNOSIS — M47816 Spondylosis without myelopathy or radiculopathy, lumbar region: Secondary | ICD-10-CM | POA: Diagnosis not present

## 2015-09-20 NOTE — ED Notes (Signed)
Pt states she fell 1 week ago and has had increasing pain since. Pt has pain in her left hip that moves down her leg. Pt is able to ambulate on that leg. NAD noted.

## 2015-09-20 NOTE — ED Provider Notes (Signed)
CSN: [id number]     Arrival date & time 09/20/15  1619 History   First MD Initiated Contact with Patient 09/20/15 1630     Chief Complaint  Patient presents with  . Fall     (Consider location/radiation/quality/duration/timing/severity/associated sxs/prior Treatment) HPI   Roberta Gonzalez is a 75 y.o. female who presents for evaluation of left hip pain which occurred after she fell one week ago. She has been able to walk but has persistent pain in the left lower back radiating to her left hip, ever since. She is using her usual medications, without relief. She denies fever, chills, nausea, vomiting, change in bowel or urinary habits. She does have chronic intermittent constipation. She denies loss of bowel or bladder continence. There are no lower extremity or perineal paresthesias. There are no other known modifying factors.   Past Medical History  Diagnosis Date  . GERD (gastroesophageal reflux disease) 06/10/2011  . Chest pain 06/10/2011  . Port catheter in place 11/29/2012  . Hypercholesteremia   . Depression   . Neuropathy ([place])   . Cancer ([place])     jan 2012/mastectomy L; chemo/rad tx  . Invasive ductal carcinoma of breast ([place]) 06/10/2011  . Breast cancer ([place])     right in 2000 in [place] and underwent a mastectomy.   Past Surgical History  Procedure Laterality Date  . Mastectomy    . Joint replacement    . Hip replacements    . Cranial shunt    . Abdominal hysterectomy    . Tonsillectomy    . Cataract extraction w/phaco Right 04/02/2013    Procedure: CATARACT EXTRACTION PHACO AND INTRAOCULAR LENS PLACEMENT (IOC);  Surgeon: [name], MD;  Location: [facility];  Service: Ophthalmology;  Laterality: Right;  CDE 22.44  . Cataract extraction w/phaco Left 04/26/2013    Procedure: CATARACT EXTRACTION PHACO AND INTRAOCULAR LENS PLACEMENT (IOC);  Surgeon: [name], MD;  Location: [facility];  Service: Ophthalmology;  Laterality: Left;  CDE: 23.60  . Esophagogastroduodenoscopy (egd)  with esophageal dilation N/A 05/03/2013    Procedure: ESOPHAGOGASTRODUODENOSCOPY (EGD) WITH ESOPHAGEAL DILATION;  Surgeon: [name], MD;  [facility];  Service: Endoscopy;  Laterality: N/A;  145-moved to 100 Ann to notify pt  . Repair of fractured penis Right last wednesday    Dr. Luna Glasgow  . Colonoscopy N/A 04/05/2014    Procedure: COLONOSCOPY;  Surgeon: [name], MD;  [facility];  Service: Endoscopy;  Laterality: N/A;  11:15   No family history on file. Social History  Substance Use Topics  . Smoking status: Former Research scientist (life sciences)  . Smokeless tobacco: Never Used     Comment: quit smoking over 20 yrs ago  . Alcohol Use: Yes     Comment: glass of liquor about twice a week   OB History    No data available     Review of Systems  All other systems reviewed and are negative.     Allergies  Codeine; Percodan; and Sulfa antibiotics  Home Medications   Prior to Admission medications   Medication Sig Start Date End Date Taking? Authorizing Provider  alendronate (FOSAMAX) 70 MG tablet Take 70 mg by mouth every 7 (seven) days. Take with a full glass of water on an empty stomach.    Historical Provider, MD  Alum & Mag Hydroxide-Simeth (GI COCKTAIL) SUSP suspension Take 30 mLs by mouth daily as needed (stomach). Shake well.    Historical Provider, MD  Cholecalciferol 1000 UNITS tablet Take  2,000 Units by mouth every morning.     Historical Provider, MD  Dexlansoprazole 30 MG capsule Take 30 mg by mouth every morning.     Historical Provider, MD  diphenhydrAMINE (BENADRYL) 25 mg capsule Take 25 mg by mouth every 4 (four) hours as needed for allergies.    Historical Provider, MD  escitalopram (LEXAPRO) 20 MG tablet Take 1 tablet (20 mg total) by mouth daily. 01/24/15   Baird Cancer, PA-C  esomeprazole (NEXIUM) 20 MG capsule Take 20 mg by mouth daily at 12 noon.    Historical Provider, MD  fexofenadine (ALLEGRA) 180 MG tablet Take 180 mg by mouth every morning.      Historical Provider, MD  fish oil-omega-3 fatty acids 1000 MG capsule Take 1 g by mouth 2 (two) times daily.     Historical Provider, MD  fluticasone (FLONASE) 50 MCG/ACT nasal spray Place 1 spray into both nostrils daily.    Historical Provider, MD  HYDROcodone-acetaminophen (NORCO/VICODIN) 5-325 MG per tablet Take 0.5-1 tablets by mouth daily as needed for severe pain.     Historical Provider, MD  ketoconazole (NIZORAL) 2 % cream  08/08/15   Historical Provider, MD  OVER THE COUNTER MEDICATION Place 1 drop into the left eye 2 (two) times daily. OTC Allergy eye drop    Historical Provider, MD  pravastatin (PRAVACHOL) 40 MG tablet Take 40 mg by mouth at bedtime.     Historical Provider, MD  Prenatal Vit-Fe Psac Cmplx-FA (PRENATAL MULTIVITAMIN) 60-1 MG tablet Take 1 tablet by mouth daily with breakfast.     Historical Provider, MD  sucralfate (CARAFATE) 1 GM/10ML suspension Take 10 mLs (1 g total) by mouth daily as needed. 03/10/15   Manon Hilding Kefalas, PA-C   BP 114/52 mmHg  Pulse 85  Temp(Src) 98.3 F (36.8 C) (Oral)  Resp 20  Ht 5\' 3"  (1.6 m)  Wt 108 lb (48.988 kg)  BMI 19.14 kg/m2  SpO2 98% Physical Exam  Constitutional: She is oriented to person, place, and time. She appears well-developed.  Elderly, frail  HENT:  Head: Normocephalic and atraumatic.  Right Ear: External ear normal.  Left Ear: External ear normal.  Eyes: Conjunctivae and EOM are normal. Pupils are equal, round, and reactive to light.  Neck: Normal range of motion and phonation normal. Neck supple.  Cardiovascular: Normal rate, regular rhythm and normal heart sounds.   Pulmonary/Chest: Effort normal and breath sounds normal. She exhibits no bony tenderness.  Abdominal: Soft. There is no tenderness.  Musculoskeletal: Normal range of motion.  Mild left low lumbar tenderness, negative straight leg raising bilaterally. Normal range of motion. Hips, knees and ankles bilaterally, both actively and passively.   Neurological: She is alert and oriented to person, place, and time. No cranial nerve deficit or sensory deficit. She exhibits normal muscle tone. Coordination normal.  Skin: Skin is warm, dry and intact.  Psychiatric: She has a normal mood and affect. Her behavior is normal.  Nursing note and vitals reviewed.   ED Course  Procedures (including critical care time)  Medications - No data to display  Patient Vitals for the past 24 hrs:  BP Temp Temp src Pulse Resp SpO2 Height Weight  09/20/15 1800 100/55 mmHg 98.2 F (36.8 C) Oral 73 18 99 % - -  09/20/15 1700 (!) 111/48 mmHg - - - - - - -  09/20/15 1645 - - - 85 - 98 % - -  09/20/15 1623 (!) 114/52 mmHg 98.3 F (36.8 C) Oral  96 20 99 % 5\' 3"  (1.6 m) 108 lb (48.988 kg)    At discharge- Reevaluation with update and discussion. After initial assessment and treatment, an updated evaluation reveals no further complaints. Findings discussed with patient, all questions answered.. Eva Review Labs Reviewed - No data to display  Imaging Review No results found. I have personally reviewed and evaluated these images and lab results as part of my medical decision-making.   EKG Interpretation None      MDM   Final diagnoses:  Fall, initial encounter  Back injury, initial encounter  Spondylosis of lumbar region without myelopathy or radiculopathy    Full without serious injury. Doubt fracture or spinal myelopathy.  Nursing Notes Reviewed/ Care Coordinated Applicable Imaging Reviewed Interpretation of Laboratory Data incorporated into ED treatment  The patient appears reasonably screened and/or stabilized for discharge and I doubt any other medical condition or other [facility] requiring further screening, evaluation, or treatment in the ED at this time prior to discharge.  Plan: Home Medications- usual; Home Treatments- rest, heat; return here if the recommended treatment, does not improve the symptoms; Recommended  follow up- PCP prn     Daleen Bo, MD 09/21/15 [phone]

## 2015-09-20 NOTE — Discharge Instructions (Signed)
Use heat on sore area 3 or 4 times a day.  Use Tylenol, or hydrocodone, as needed for pain.

## 2015-09-20 NOTE — ED Notes (Signed)
MD at bedside. 

## 2015-10-20 ENCOUNTER — Encounter (HOSPITAL_COMMUNITY): Payer: Medicare Other

## 2015-10-27 ENCOUNTER — Encounter (HOSPITAL_COMMUNITY): Payer: Medicare Other | Attending: Hematology & Oncology

## 2015-10-27 VITALS — BP 104/47 | HR 99 | Temp 98.2°F | Resp 18

## 2015-10-27 DIAGNOSIS — C50919 Malignant neoplasm of unspecified site of unspecified female breast: Secondary | ICD-10-CM | POA: Insufficient documentation

## 2015-10-27 DIAGNOSIS — M810 Age-related osteoporosis without current pathological fracture: Secondary | ICD-10-CM | POA: Insufficient documentation

## 2015-10-27 DIAGNOSIS — Z452 Encounter for adjustment and management of vascular access device: Secondary | ICD-10-CM

## 2015-10-27 DIAGNOSIS — Z780 Asymptomatic menopausal state: Secondary | ICD-10-CM | POA: Insufficient documentation

## 2015-10-27 DIAGNOSIS — C773 Secondary and unspecified malignant neoplasm of axilla and upper limb lymph nodes: Secondary | ICD-10-CM

## 2015-10-27 DIAGNOSIS — C50912 Malignant neoplasm of unspecified site of left female breast: Secondary | ICD-10-CM

## 2015-10-27 DIAGNOSIS — Z95828 Presence of other vascular implants and grafts: Secondary | ICD-10-CM

## 2015-10-27 MED ORDER — HEPARIN SOD (PORK) LOCK FLUSH 100 UNIT/ML IV SOLN
500.0000 [IU] | Freq: Once | INTRAVENOUS | Status: AC
  Administered 2015-10-27: 500 [IU] via INTRAVENOUS
  Filled 2015-10-27: qty 5

## 2015-10-27 MED ORDER — SODIUM CHLORIDE 0.9 % IJ SOLN
10.0000 mL | Freq: Once | INTRAMUSCULAR | Status: AC
  Administered 2015-10-27: 10 mL via INTRAVENOUS

## 2015-10-27 NOTE — Patient Instructions (Signed)
[facility] at [facility] Discharge Instructions  RECOMMENDATIONS MADE BY THE CONSULTANT AND ANY TEST RESULTS WILL BE SENT TO YOUR REFERRING PHYSICIAN.  Port flush today as scheduled. Return as scheduled.  Thank you for choosing [facility] at [facility] to provide your oncology and hematology care.  To afford each patient quality time with our provider, please arrive at least 15 minutes before your scheduled appointment time.    You need to re-schedule your appointment should you arrive 10 or more minutes late.  We strive to give you quality time with our providers, and arriving late affects you and other patients whose appointments are after yours.  Also, if you no show three or more times for appointments you may be dismissed from the clinic at the providers discretion.     Again, thank you for choosing [facility].  Our hope is that these requests will decrease the amount of time that you wait before being seen by our physicians.       _____________________________________________________________  Should you have questions after your visit to [facility], please contact our office at [phone] between the hours of 8:30 a.m. and 4:30 p.m.  Voicemails left after 4:30 p.m. will not be returned until the following business day.  For prescription refill requests, have your pharmacy contact our office.    

## 2015-10-27 NOTE — Progress Notes (Signed)
Roberta Gonzalez presented for Portacath access and flush. Proper placement of portacath confirmed by CXR. Portacath located right chest wall accessed with  H 20 needle. Good blood return present. Portacath flushed with 20ml NS and 500U/5ml Heparin and needle removed intact. Procedure without incident. Patient tolerated procedure well.   

## 2015-11-07 ENCOUNTER — Other Ambulatory Visit (HOSPITAL_COMMUNITY): Payer: Self-pay | Admitting: Oncology

## 2015-12-15 ENCOUNTER — Encounter (HOSPITAL_COMMUNITY): Payer: Medicare Other

## 2015-12-17 ENCOUNTER — Encounter (HOSPITAL_COMMUNITY): Payer: Medicare Other | Attending: Hematology & Oncology

## 2015-12-17 VITALS — BP 112/48 | HR 82 | Temp 98.0°F | Resp 18

## 2015-12-17 DIAGNOSIS — Z95828 Presence of other vascular implants and grafts: Secondary | ICD-10-CM

## 2015-12-17 DIAGNOSIS — C773 Secondary and unspecified malignant neoplasm of axilla and upper limb lymph nodes: Secondary | ICD-10-CM | POA: Diagnosis not present

## 2015-12-17 DIAGNOSIS — M810 Age-related osteoporosis without current pathological fracture: Secondary | ICD-10-CM | POA: Insufficient documentation

## 2015-12-17 DIAGNOSIS — C50912 Malignant neoplasm of unspecified site of left female breast: Secondary | ICD-10-CM | POA: Diagnosis not present

## 2015-12-17 DIAGNOSIS — Z780 Asymptomatic menopausal state: Secondary | ICD-10-CM | POA: Insufficient documentation

## 2015-12-17 DIAGNOSIS — Z452 Encounter for adjustment and management of vascular access device: Secondary | ICD-10-CM | POA: Diagnosis present

## 2015-12-17 DIAGNOSIS — C50919 Malignant neoplasm of unspecified site of unspecified female breast: Secondary | ICD-10-CM | POA: Insufficient documentation

## 2015-12-17 MED ORDER — SODIUM CHLORIDE 0.9 % IJ SOLN
20.0000 mL | INTRAMUSCULAR | Status: DC | PRN
  Administered 2015-12-17: 20 mL via INTRAVENOUS
  Filled 2015-12-17: qty 20

## 2015-12-17 MED ORDER — HEPARIN SOD (PORK) LOCK FLUSH 100 UNIT/ML IV SOLN
500.0000 [IU] | Freq: Once | INTRAVENOUS | Status: AC
  Administered 2015-12-17: 500 [IU] via INTRAVENOUS
  Filled 2015-12-17: qty 5

## 2015-12-17 NOTE — Progress Notes (Signed)
Roberta Gonzalez presented for Portacath access and flush. Proper placement of portacath confirmed by CXR. Portacath located right chest wall accessed with  H 20 needle. Good blood return present. Portacath flushed with 20ml NS and 500U/5ml Heparin and needle removed intact. Procedure without incident. Patient tolerated procedure well.   

## 2015-12-17 NOTE — Patient Instructions (Signed)
[place] at [facility] Discharge Instructions  RECOMMENDATIONS MADE BY THE CONSULTANT AND ANY TEST RESULTS WILL BE SENT TO YOUR REFERRING PHYSICIAN.  Port Flush today.    Thank you for choosing [place] at [facility] to provide your oncology and hematology care.  To afford each patient quality time with our provider, please arrive at least 15 minutes before your scheduled appointment time.   Beginning January 23rd 2017 lab work for the [facility] will be done in the  Main lab at [facility] on 1st floor. If you have a lab appointment with the [facility] please come in thru the  Main Entrance and check in at the main information desk  You need to re-schedule your appointment should you arrive 10 or more minutes late.  We strive to give you quality time with our providers, and arriving late affects you and other patients whose appointments are after yours.  Also, if you no show three or more times for appointments you may be dismissed from the clinic at the providers discretion.     Again, thank you for choosing [facility].  Our hope is that these requests will decrease the amount of time that you wait before being seen by our physicians.       _____________________________________________________________  Should you have questions after your visit to [facility], please contact our office at [phone] between the hours of 8:30 a.m. and 4:30 p.m.  Voicemails left after 4:30 p.m. will not be returned until the following business day.  For prescription refill requests, have your pharmacy contact our office.

## 2015-12-31 ENCOUNTER — Telehealth (HOSPITAL_COMMUNITY): Payer: Self-pay | Admitting: *Deleted

## 2015-12-31 NOTE — Telephone Encounter (Signed)
She is on a PPI.  There is nothing else that I am aware of.  I would recommend she follow-up with her primary care provider and/or GI physician regarding this issue.  Robynn Pane, PA-C 12/31/2015 5:11 PM

## 2015-12-31 NOTE — Telephone Encounter (Signed)
Talked with Roberta Gonzalez and she does not take the carafate very often. She uses it when she needs more immediate relief from reflux/burning. She has not seen Dr. Laural Golden in quite awhile. I mentioned that she could try mylanta and if that does not work for her to f/u with PCP of get back in to see Dr. Laural Golden.

## 2015-12-31 NOTE — Telephone Encounter (Signed)
Message left for patient to call us back.

## 2016-02-09 ENCOUNTER — Encounter (HOSPITAL_COMMUNITY): Payer: Medicare Other

## 2016-02-09 ENCOUNTER — Ambulatory Visit (HOSPITAL_COMMUNITY): Payer: Medicare Other | Admitting: Hematology & Oncology

## 2016-02-19 ENCOUNTER — Encounter (HOSPITAL_COMMUNITY): Payer: Self-pay | Admitting: Hematology & Oncology

## 2016-02-19 ENCOUNTER — Encounter (HOSPITAL_COMMUNITY): Payer: Medicare Other | Attending: Hematology & Oncology | Admitting: Hematology & Oncology

## 2016-02-19 ENCOUNTER — Encounter (HOSPITAL_BASED_OUTPATIENT_CLINIC_OR_DEPARTMENT_OTHER): Payer: Medicare Other

## 2016-02-19 VITALS — BP 106/50 | HR 79 | Temp 98.3°F | Resp 18 | Wt 109.0 lb

## 2016-02-19 DIAGNOSIS — M810 Age-related osteoporosis without current pathological fracture: Secondary | ICD-10-CM | POA: Diagnosis not present

## 2016-02-19 DIAGNOSIS — C50912 Malignant neoplasm of unspecified site of left female breast: Secondary | ICD-10-CM

## 2016-02-19 DIAGNOSIS — C773 Secondary and unspecified malignant neoplasm of axilla and upper limb lymph nodes: Secondary | ICD-10-CM

## 2016-02-19 DIAGNOSIS — Z95828 Presence of other vascular implants and grafts: Secondary | ICD-10-CM

## 2016-02-19 DIAGNOSIS — Z780 Asymptomatic menopausal state: Secondary | ICD-10-CM | POA: Insufficient documentation

## 2016-02-19 DIAGNOSIS — C50919 Malignant neoplasm of unspecified site of unspecified female breast: Secondary | ICD-10-CM | POA: Diagnosis not present

## 2016-02-19 DIAGNOSIS — Z171 Estrogen receptor negative status [ER-]: Secondary | ICD-10-CM

## 2016-02-19 DIAGNOSIS — M25551 Pain in right hip: Secondary | ICD-10-CM | POA: Diagnosis not present

## 2016-02-19 DIAGNOSIS — M898X9 Other specified disorders of bone, unspecified site: Secondary | ICD-10-CM

## 2016-02-19 LAB — CBC WITH DIFFERENTIAL/PLATELET
BASOS ABS: 0 10*3/uL (ref 0.0–0.1)
BASOS PCT: 1 %
EOS ABS: 0.2 10*3/uL (ref 0.0–0.7)
Eosinophils Relative: 3 %
HCT: 36.7 % (ref 36.0–46.0)
HEMOGLOBIN: 12 g/dL (ref 12.0–15.0)
Lymphocytes Relative: 25 %
Lymphs Abs: 1.4 10*3/uL (ref 0.7–4.0)
MCH: 31.9 pg (ref 26.0–34.0)
MCHC: 32.7 g/dL (ref 30.0–36.0)
MCV: 97.6 fL (ref 78.0–100.0)
MONO ABS: 0.6 10*3/uL (ref 0.1–1.0)
MONOS PCT: 11 %
NEUTROS ABS: 3.4 10*3/uL (ref 1.7–7.7)
NEUTROS PCT: 60 %
Platelets: 277 10*3/uL (ref 150–400)
RBC: 3.76 MIL/uL — ABNORMAL LOW (ref 3.87–5.11)
RDW: 12.4 % (ref 11.5–15.5)
WBC: 5.5 10*3/uL (ref 4.0–10.5)

## 2016-02-19 LAB — COMPREHENSIVE METABOLIC PANEL
ALBUMIN: 4.3 g/dL (ref 3.5–5.0)
ALT: 14 U/L (ref 14–54)
ANION GAP: 8 (ref 5–15)
AST: 16 U/L (ref 15–41)
Alkaline Phosphatase: 38 U/L (ref 38–126)
BUN: 17 mg/dL (ref 6–20)
CO2: 25 mmol/L (ref 22–32)
Calcium: 9 mg/dL (ref 8.9–10.3)
Chloride: 105 mmol/L (ref 101–111)
Creatinine, Ser: 0.5 mg/dL (ref 0.44–1.00)
GFR calc Af Amer: >60 mL/min (ref >60)
GFR calc non Af Amer: >60 mL/min (ref >60)
GLUCOSE: 100 mg/dL — AB (ref 65–99)
POTASSIUM: 4 mmol/L (ref 3.5–5.1)
SODIUM: 138 mmol/L (ref 135–145)
Total Bilirubin: 0.6 mg/dL (ref 0.3–1.2)
Total Protein: 6.9 g/dL (ref 6.5–8.1)

## 2016-02-19 MED ORDER — SODIUM CHLORIDE 0.9% FLUSH
20.0000 mL | INTRAVENOUS | Status: DC | PRN
  Administered 2016-02-19: 20 mL via INTRAVENOUS
  Filled 2016-02-19: qty 20

## 2016-02-19 MED ORDER — HEPARIN SOD (PORK) LOCK FLUSH 100 UNIT/ML IV SOLN
500.0000 [IU] | Freq: Once | INTRAVENOUS | Status: AC
  Administered 2016-02-19: 500 [IU] via INTRAVENOUS

## 2016-02-19 NOTE — Patient Instructions (Addendum)
[place] at [facility] Discharge Instructions  RECOMMENDATIONS MADE BY THE CONSULTANT AND ANY TEST RESULTS WILL BE SENT TO YOUR REFERRING PHYSICIAN.   Exam and discussion by Dr Whitney Muse today Port flush today Port flush every 8 weeks  Bone scan scheduled Referral to Dr Arnoldo Morale to have port removed   Return to see the doctor in 6 months  Please call the clinic if you have any questions or concerns    Thank you for choosing [place] at [facility] to provide your oncology and hematology care.  To afford each patient quality time with our provider, please arrive at least 15 minutes before your scheduled appointment time.   Beginning January 23rd 2017 lab work for the [facility] will be done in the  Main lab at [facility] on 1st floor. If you have a lab appointment with the [place] please come in thru the  Main Entrance and check in at the main information desk  You need to re-schedule your appointment should you arrive 10 or more minutes late.  We strive to give you quality time with our providers, and arriving late affects you and other patients whose appointments are after yours.  Also, if you no show three or more times for appointments you may be dismissed from the clinic at the providers discretion.     Again, thank you for choosing [facility].  Our hope is that these requests will decrease the amount of time that you wait before being seen by our physicians.       _____________________________________________________________  Should you have questions after your visit to [facility], please contact our office at [phone] between the hours of 8:30 a.m. and 4:30 p.m.  Voicemails left after 4:30 p.m. will not be returned until the following business day.  For prescription refill requests, have your pharmacy contact our office.         Resources For Cancer Patients and their  Caregivers ? American Cancer Society: Can assist with transportation, wigs, general needs, runs Look Good Feel Better.        807 804 1429 ? Cancer Care: Provides financial assistance, online support groups, medication/co-pay assistance.  1-800-813-HOPE (810) 375-8010) ? [place] Assists [place] cancer patients and their families through emotional , educational and financial support.  617-279-9315 ? [place] DSS Where to apply for food stamps, Medicaid and utility assistance. 909-541-4714 ? [employer]: Transportation to medical appointments. 4841915720 ? Social Security Administration: May apply for disability if have a Stage IV cancer. ([phone] 3802186414 ? [employer], Disability and Transit Services: Assists with nutrition, care and transit needs. (680)215-2113

## 2016-02-19 NOTE — Patient Instructions (Signed)
[place] at [facility] Discharge Instructions  RECOMMENDATIONS MADE BY THE CONSULTANT AND ANY TEST RESULTS WILL BE SENT TO YOUR REFERRING PHYSICIAN.  Port flush with labs.    Thank you for choosing [place] at [facility] to provide your oncology and hematology care.  To afford each patient quality time with our provider, please arrive at least 15 minutes before your scheduled appointment time.   Beginning January 23rd 2017 lab work for the [facility] will be done in the  Main lab at [facility] on 1st floor. If you have a lab appointment with the [place] please come in thru the  Main Entrance and check in at the main information desk  You need to re-schedule your appointment should you arrive 10 or more minutes late.  We strive to give you quality time with our providers, and arriving late affects you and other patients whose appointments are after yours.  Also, if you no show three or more times for appointments you may be dismissed from the clinic at the providers discretion.     Again, thank you for choosing [facility].  Our hope is that these requests will decrease the amount of time that you wait before being seen by our physicians.       _____________________________________________________________  Should you have questions after your visit to [facility], please contact our office at [phone] between the hours of 8:30 a.m. and 4:30 p.m.  Voicemails left after 4:30 p.m. will not be returned until the following business day.  For prescription refill requests, have your pharmacy contact our office.         Resources For Cancer Patients and their Caregivers ? American Cancer Society: Can assist with transportation, wigs, general needs, runs Look Good Feel Better.        928-791-4309 ? Cancer Care: Provides financial assistance, online support groups, medication/co-pay assistance.   1-800-813-HOPE 225-446-4020) ? [place] Assists [place] cancer patients and their families through emotional , educational and financial support.  (864) 709-3767 ? [place] DSS Where to apply for food stamps, Medicaid and utility assistance. 636-202-8581 ? RCATS: Transportation to medical appointments. 5047152403 ? Social Security Administration: May apply for disability if have a Stage IV cancer. 671-183-8738 9073334836 ? [employer], Disability and Transit Services: Assists with nutrition, care and transit needs. [phone]

## 2016-02-19 NOTE — Progress Notes (Signed)
Roberta Curet, MD [place] [address]  Invasive ductal carcinoma of breast, left ([place]) - Plan: Cancer antigen 27.29, Cancer antigen 15-3, NM Bone Scan Whole Body, CBC with Differential, Comprehensive metabolic panel, Cancer antigen 27.29, Cancer antigen 15-3, CBC with Differential, Comprehensive metabolic panel  Bone pain - Plan: Cancer antigen 27.29, Cancer antigen 15-3, NM Bone Scan Whole Body, CBC with Differential, Comprehensive metabolic panel, Cancer antigen 27.29, Cancer antigen 15-3, CBC with Differential, Comprehensive metabolic panel  Hip pain, right - Plan: Cancer antigen 27.29, Cancer antigen 15-3, NM Bone Scan Whole Body, Cancer antigen 27.29, Cancer antigen 15-3  CURRENT THERAPY: Surveillance per NCCN guidelines  INTERVAL HISTORY: Roberta Gonzalez 75 y.o. 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 HER2 was positive with a ratio of 2.09. Primary was 2.1 cm. She had a 2nd lesion that was 0.4 cm. Underwent mastectomy 11/27/2010. She therefore had a T2 N1 MX cancer. She has had 6 cycles of carboplatin with Taxotere and 52 weeks of Herceptin (finishing on 12/03/2011).     Invasive ductal carcinoma of breast ([place])   10/28/2010 Initial Diagnosis Breast, left, needle core biopsy, :  - INVASIVE DUCTAL CARCINOMA.   11/27/2010 Surgery Left simple mastectomy: Multicentric invasive ductal cancer, 2.1 and 0.4 cm, +LVI, 2/8 positive lymph nodes.  ER-, PR-, HER2 3+, Ki-67 82%.   12/07/2010 - 05/03/2011 Chemotherapy Carboplatin/Taxotere x 6 cycles (Approximate dates)   12/07/2010 - 12/03/2011 Antibody Plan 52 weeks of Herceptin (Aproximate start date)   05/05/2011 Remission      Ms. Choung is here today with her daughter and has a cane.   Her right hip has been hurting her  for a couple of weeks now. She said that she has not fallen but may have twisted it and not realized it. She thinks that her hip and right buttocks are swollen. This pain does not keep her up at night. She got her hip replaced in 2010. She can stand on her leg.   Her daughter said that her left shoulder was really swollen last week.   She also gets "knots" in different places and the only thing that helps is a heating pad.  Her daughter said that she has not done her exercises in a while, she says that she does not like doing them.  She is having a port flush done today. Her PCP has told her to get her port taken out because it will cause an infection. She does want to take it out because it has been bothering her and is itchy.   She is still taking calcium and vitamin D every day.   Past Medical History  Diagnosis Date  . GERD (gastroesophageal reflux disease) 06/10/2011  . Chest pain 06/10/2011  . Port catheter in place 11/29/2012  . Hypercholesteremia   . Depression   . Neuropathy ([place])   . Cancer ([place])     jan 2012/mastectomy L; chemo/rad tx  . Invasive ductal carcinoma of breast ([place]) 06/10/2011  . Breast cancer ([place])     right in 2000 in [place] and underwent a mastectomy.    has Invasive ductal carcinoma of breast ([place]); GERD (gastroesophageal reflux disease); Chest pain; Port catheter in place; Personal history of colonic polyps; and Family hx of colon cancer on  her problem list.     is allergic to codeine; percodan; and sulfa antibiotics.  We administered heparin lock flush and sodium chloride.  Past Surgical History  Procedure Laterality Date  . Mastectomy    . Joint replacement    . Hip replacements    . Cranial shunt    . Abdominal hysterectomy    . Tonsillectomy    . Cataract extraction w/phaco Right 04/02/2013    Procedure: CATARACT EXTRACTION PHACO AND INTRAOCULAR LENS PLACEMENT (IOC);  Surgeon: [name], MD;  Location: [facility] OR[facility];  Service: Ophthalmology;   Laterality: Right;  CDE 22.44  . Cataract extraction w/phaco Left 04/26/2013    Procedure: CATARACT EXTRACTION PHACO AND INTRAOCULAR LENS PLACEMENT (IOC);  Surgeon: [name], MD;  Location: [facility];  Service: Ophthalmology;  Laterality: Left;  CDE: 23.60  . Esophagogastroduodenoscopy (egd) with esophageal dilation N/A 05/03/2013    Procedure: ESOPHAGOGASTRODUODENOSCOPY (EGD) WITH ESOPHAGEAL DILATION;  Surgeon: [name], MD;  Location: [facility] END[facility] SUITE;  Service: Endoscopy;  Laterality: N/A;  145-moved to 100 Ann to notify pt  . Repair of fractured penis Right last wednesday    Dr. Luna Glasgow  . Colonoscopy N/A 04/05/2014    Procedure: COLONOSCOPY;  Surgeon: [name], MD;  Location: [facility] SU[facility];  Service: Endoscopy;  Laterality: N/A;  11:15   Social History She was born in [place]. She has two daughters, five grandchildren, and two great-grandchildren. Her youngest daughter lives with her, along with her grandson.  Positive for hip pain. Right hip pain for the past couple of weeks. Right buttocks swollen.  Review of Systems  Denies any headaches, dizziness, double vision, fevers, chills, night sweats, nausea, vomiting, diarrhea, constipation, chest pain, heart palpitations, shortness of breath, blood in stool, black tarry stool, urinary pain, urinary burning, urinary frequency, hematuria.  14 point review of systems was performed and is negative except as detailed under history of present illness and above    PHYSICAL EXAMINATION  ECOG PERFORMANCE STATUS: 0 - Asymptomatic  Filed Vitals:   02/19/16 1321  BP: 106/50  Pulse: 79  Temp: 98.3 F (36.8 C)  Resp: 18    GENERAL:alert, no distress, well nourished, well developed, comfortable, cooperative, smiling and accompanied by daughter  SKIN: skin color, texture, turgor are normal, no rashes or significant lesions HEAD: Normocephalic, No masses, lesions, tenderness or abnormalities EYES: normal, PERRLA, EOMI,  Conjunctiva are pink and non-injected EARS: External ears normal OROPHARYNX:lips, buccal mucosa, and tongue normal and mucous membranes are moist  NECK: supple, no adenopathy, thyroid normal size, non-tender, without nodularity, no stridor, non-tender, trachea midline LYMPH:  no palpable lymphadenopathy BREAST:post-mastectomy bilaterally site well healed  Bilateral mastectomies; no palpable nodules  Left side has radiation changes, angioectasias LUNGS: clear to auscultation  HEART: regular rate & rhythm, no murmurs, no gallops, S1 normal and S2 normal ABDOMEN:abdomen soft, non-tender, normal bowel sounds and no masses or organomegaly BACK: Back symmetric, no curvature., No CVA tenderness EXTREMITIES:less then 2 second capillary refill, no joint deformities, effusion, or inflammation, no edema, no skin discoloration, no clubbing, no cyanosis  NEURO: alert & oriented x 3 with fluent speech, no focal motor/sensory deficits, gait slow, abnormal   LABORATORY DATA: I have reviewed the data as listed.  CBC    Component Value Date/Time   WBC 5.5 02/19/2016 1412   RBC 3.76* 02/19/2016 1412   HGB 12.0 02/19/2016 1412   HCT 36.7 02/19/2016 1412   PLT 277 02/19/2016 1412   MCV 97.6 02/19/2016 1412  MCH 31.9 02/19/2016 1412   MCHC 32.7 02/19/2016 1412   RDW 12.4 02/19/2016 1412   LYMPHSABS 1.4 02/19/2016 1412   MONOABS 0.6 02/19/2016 1412   EOSABS 0.2 02/19/2016 1412   BASOSABS 0.0 02/19/2016 1412      Chemistry      Component Value Date/Time   NA 138 08/25/2015 1155   K 3.7 08/25/2015 1155   CL 107 08/25/2015 1155   CO2 27 08/25/2015 1155   BUN 11 08/25/2015 1155   CREATININE 0.47 08/25/2015 1155      Component Value Date/Time   CALCIUM 8.5* 08/25/2015 1155   ALKPHOS 36* 08/25/2015 1155   AST 17 08/25/2015 1155   ALT 12* 08/25/2015 1155   BILITOT 0.4 08/25/2015 1155       ASSESSMENT AND PLAN:  Stage II, 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 Given her disease histology I recommended a bone scan and she is agreeable.  She continues on Fosamax for her osteoporosis. This is managed by her primary care doctor. I have advised her to take calcium 1200 mg daily and between 1000 to 2000 international units of vitamin D.   Orders Placed This Encounter  Procedures  . NM Bone Scan Whole Body    Standing Status: Future     Number of Occurrences: 1     Standing Expiration Date: 02/18/2017    Order Specific Question:  Reason for Exam (SYMPTOM  OR DIAGNOSIS REQUIRED)    Answer:  new onset hip back pain, history ER- HER 2 + breast cancer    Order Specific Question:  Preferred imaging location?    Answer:  [facility]    Order Specific Question:  If indicated for the ordered procedure, I authorize the administration of a radiopharmaceutical per Radiology protocol    Answer:  Yes  . Cancer antigen 27.29    Standing Status: Future     Number of Occurrences: 1     Standing Expiration Date: 02/18/2017  . Cancer antigen 15-3    Standing Status: Future     Number of Occurrences: 1     Standing Expiration Date: 02/18/2017  . CBC with Differential    Standing Status: Future     Number of Occurrences: 1     Standing Expiration Date: 02/18/2017  . Comprehensive metabolic panel    Standing Status: Future     Number of Occurrences: 1     Standing Expiration Date: 02/18/2017    We will keep her apprised of the results of her bone scan once available.  THERAPY PLAN:  NCCN guidelines recommends the following surveillance for invasive breast cancer:  A. History and Physical exam every 4-6 months for 5 years and then every 12 months.  B. Mammography every 12 months  C. Women on Tamoxifen: annual gynecologic assessment every 12 months if uterus is present.  D. Women on aromatase inhibitor or who  experience ovarian failure secondary to treatment should have monitoring of bone health with a bone mineral density determination at baseline and periodically thereafter.  E. Assess and encourage adherence to adjuvant endocrine therapy.  F. Evidence suggests that active lifestyle and achieving and maintaining an ideal body weight (20-25 BMI) may lead to optimal breast cancer outcomes.   I will refer her to Dr. Arnoldo Morale to take out her port as she would  like to have it removed.   She will return for a follow up in 6 months. We can move her visits out to yearly at her next visit.    All questions were answered. The patient knows to call the clinic with any problems, questions or concerns. We can certainly see the patient much sooner if necessary.  This document serves as a record of services personally performed by Ancil Linsey, MD. It was created on her behalf by Kandace Blitz, a trained medical scribe. The creation of this record is based on the scribe's personal observations and the provider's statements to them. This document has been checked and approved by the attending provider.  I have reviewed the above documentation for accuracy and completeness, and I agree with the above.  This note is electronically signed by: Molli Hazard, MD  02/19/2016 2:51 PM

## 2016-02-19 NOTE — Progress Notes (Signed)
Roberta Gonzalez presented for Portacath access and flush. Proper placement of portacath confirmed by CXR. Portacath located right chest wall accessed with  H 20 needle. Good blood return present.  Specimen drawn for labs.   Portacath flushed with 71ml NS and 500U/95ml Heparin and needle removed intact. Procedure without incident. Patient tolerated procedure well.

## 2016-02-20 LAB — CANCER ANTIGEN 15-3: CANCER ANTIGEN-BREAST 15-3: 3 U/mL (ref <32)

## 2016-02-20 LAB — CANCER ANTIGEN 27.29: CA 27.29: 8.4 U/mL (ref 0.0–38.6)

## 2016-02-26 ENCOUNTER — Encounter (HOSPITAL_COMMUNITY): Payer: Medicare Other

## 2016-02-26 ENCOUNTER — Encounter (HOSPITAL_COMMUNITY): Admission: RE | Admit: 2016-02-26 | Payer: Medicare Other | Source: Ambulatory Visit

## 2016-03-02 ENCOUNTER — Encounter (HOSPITAL_COMMUNITY)
Admission: RE | Admit: 2016-03-02 | Discharge: 2016-03-02 | Disposition: A | Discharge status: Home / Self Care | Payer: Medicare Other | Source: Ambulatory Visit | Attending: Hematology & Oncology | Admitting: Hematology & Oncology

## 2016-03-02 DIAGNOSIS — C50912 Malignant neoplasm of unspecified site of left female breast: Secondary | ICD-10-CM

## 2016-03-02 DIAGNOSIS — R937 Abnormal findings on diagnostic imaging of other parts of musculoskeletal system: Secondary | ICD-10-CM | POA: Insufficient documentation

## 2016-03-02 DIAGNOSIS — M898X9 Other specified disorders of bone, unspecified site: Secondary | ICD-10-CM

## 2016-03-02 DIAGNOSIS — Z853 Personal history of malignant neoplasm of breast: Secondary | ICD-10-CM | POA: Diagnosis present

## 2016-03-02 DIAGNOSIS — M25551 Pain in right hip: Secondary | ICD-10-CM | POA: Diagnosis not present

## 2016-03-02 DIAGNOSIS — Z96643 Presence of artificial hip joint, bilateral: Secondary | ICD-10-CM | POA: Insufficient documentation

## 2016-03-02 DIAGNOSIS — M899 Disorder of bone, unspecified: Secondary | ICD-10-CM | POA: Insufficient documentation

## 2016-03-02 MED ORDER — TECHNETIUM TC 99M MEDRONATE IV KIT
25.0000 | PACK | Freq: Once | INTRAVENOUS | Status: AC | PRN
  Administered 2016-03-02: 25 via INTRAVENOUS

## 2016-03-03 ENCOUNTER — Other Ambulatory Visit (HOSPITAL_COMMUNITY): Payer: Self-pay | Admitting: Oncology

## 2016-03-03 DIAGNOSIS — C50912 Malignant neoplasm of unspecified site of left female breast: Secondary | ICD-10-CM

## 2016-03-04 ENCOUNTER — Ambulatory Visit (HOSPITAL_COMMUNITY)
Admission: RE | Admit: 2016-03-04 | Discharge: 2016-03-04 | Disposition: A | Discharge status: Home / Self Care | Payer: Medicare Other | Source: Ambulatory Visit | Attending: Oncology | Admitting: Oncology

## 2016-03-04 DIAGNOSIS — M25552 Pain in left hip: Secondary | ICD-10-CM | POA: Diagnosis present

## 2016-03-04 DIAGNOSIS — C50912 Malignant neoplasm of unspecified site of left female breast: Secondary | ICD-10-CM

## 2016-03-04 DIAGNOSIS — Z96643 Presence of artificial hip joint, bilateral: Secondary | ICD-10-CM | POA: Diagnosis not present

## 2016-03-04 DIAGNOSIS — Z853 Personal history of malignant neoplasm of breast: Secondary | ICD-10-CM | POA: Insufficient documentation

## 2016-03-16 NOTE — H&P (Signed)
  NTS SOAP Note  Vital Signs:  Vitals as of: 123XX123: Systolic 123456: Diastolic 63: Heart Rate 91: Temp 98.60F (Temporal): Height 75ft 3in: Weight 111Lbs 0 Ounces: BMI 19.66   BMI : 19.66 kg/m2  Subjective: This 75 year old female presents for of need for Port-A-Cath removal. Patient has had a port in place for a left breast carcinoma and she has now finished with chemotherapy. She has been referred by Dr. Whitney Muse of oncology for removal.  Review of Symptoms:  Constitutional:negative Head:negative    Eyes:negative sinus problems Cardiovascular:  negative Respiratory:negative heartburn Genitourinary:negative joint pain, back pain, numbness Skin:negative Hematolgic/Lymphatic:negative Allergic/Immunologic:negative     Past Medical History:  Reviewed  Past Medical History  Surgical History: joint replacement, mastectomy, hysterectomy, cataract surgery Medical Problems: GERD, high cholesterol levels, depression, neuropathy, left breast cancer, history of right breast cancer Allergies: sulfur Medications: patient forgot to bring medications, no blood thinners   Social History:Reviewed  Social History  Preferred Language: English Race:  White Ethnicity: Not Hispanic / Latino Age: 52 year Marital Status:  M Alcohol: occassionally   Smoking Status: Never smoker reviewed on 03/16/2016 Functional Status reviewed on 03/16/2016 ------------------------------------------------ Bathing: Normal Cooking: Normal Dressing: Normal Driving: Normal Eating: Normal Managing Meds: Normal Oral Care: Normal Shopping: Normal Toileting: Normal Transferring: Normal Walking: Disablilty - uses cane Cognitive Status reviewed on 03/16/2016 ------------------------------------------------ Attention: Normal Decision Making: Normal Language: Normal Memory: Normal Motor: Normal Perception: Normal Problem Solving: Normal Visual and Spatial: Normal   Family  History:Reviewed  Family Health History Mother, Deceased; Healthy;  Father, Deceased; Healthy;     Objective Information: General:Well appearing, well nourished in no distress. Port-A-Cath in place in right upper chest. Heart:RRR, no murmur or gallop.  Normal S1, S2.  No S3, S4.  Lungs:  CTA bilaterally, no wheezes, rhonchi, rales.  Breathing unlabored.  Assessment:left breast carcinoma, finished with chemotherapy  Diagnoses: 174.1  C50.112 Primary malignant neoplasm of central portion of female breast (Malignant neoplasm of central portion of left female breast)  Procedures: [phone] - OFFICE OUTPATIENT NEW 30 MINUTES    Plan:scheduled for Port-A-Cath removal in the minor procedure room on 03/22/2016.   Patient Education:Alternative treatments to surgery were discussed with patient (and family).  Risks and benefits  of procedure were fully explained to the patient (and family) who gave informed consent. Patient/family questions were addressed.  Follow-up:Pending Surgery

## 2016-03-22 ENCOUNTER — Encounter (HOSPITAL_COMMUNITY)
Admission: RE | Disposition: A | Discharge status: Home / Self Care | Payer: Self-pay | Source: Ambulatory Visit | Attending: General Surgery

## 2016-03-22 ENCOUNTER — Ambulatory Visit (HOSPITAL_COMMUNITY)
Admission: RE | Admit: 2016-03-22 | Discharge: 2016-03-22 | Disposition: A | Discharge status: Home / Self Care | Payer: Medicare Other | Source: Ambulatory Visit | Attending: General Surgery | Admitting: General Surgery

## 2016-03-22 ENCOUNTER — Encounter (HOSPITAL_COMMUNITY): Payer: Self-pay | Admitting: *Deleted

## 2016-03-22 DIAGNOSIS — Z853 Personal history of malignant neoplasm of breast: Secondary | ICD-10-CM | POA: Insufficient documentation

## 2016-03-22 DIAGNOSIS — K219 Gastro-esophageal reflux disease without esophagitis: Secondary | ICD-10-CM | POA: Insufficient documentation

## 2016-03-22 DIAGNOSIS — Z79899 Other long term (current) drug therapy: Secondary | ICD-10-CM | POA: Insufficient documentation

## 2016-03-22 DIAGNOSIS — Z452 Encounter for adjustment and management of vascular access device: Secondary | ICD-10-CM | POA: Diagnosis present

## 2016-03-22 DIAGNOSIS — Z9221 Personal history of antineoplastic chemotherapy: Secondary | ICD-10-CM | POA: Insufficient documentation

## 2016-03-22 DIAGNOSIS — F329 Major depressive disorder, single episode, unspecified: Secondary | ICD-10-CM | POA: Diagnosis not present

## 2016-03-22 HISTORY — PX: PORT-A-CATH REMOVAL: SHX5289

## 2016-03-22 SURGERY — MINOR REMOVAL PORT-A-CATH
Anesthesia: LOCAL | Site: Chest | Laterality: Right

## 2016-03-22 MED ORDER — LIDOCAINE HCL (PF) 1 % IJ SOLN
INTRAMUSCULAR | Status: DC | PRN
  Administered 2016-03-22: 7 mL

## 2016-03-22 MED ORDER — LIDOCAINE HCL (PF) 1 % IJ SOLN
INTRAMUSCULAR | Status: AC
Start: 2016-03-22 — End: 2016-03-22
  Filled 2016-03-22: qty 5

## 2016-03-22 SURGICAL SUPPLY — 22 items
CLOTH BEACON ORANGE TIMEOUT ST (SAFETY) ×2 IMPLANT
DECANTER SPIKE VIAL GLASS SM (MISCELLANEOUS) IMPLANT
DERMABOND ADVANCED (GAUZE/BANDAGES/DRESSINGS) ×1
DERMABOND ADVANCED .7 DNX12 (GAUZE/BANDAGES/DRESSINGS) ×1 IMPLANT
DRAPE PROXIMA HALF (DRAPES) ×2 IMPLANT
DURAPREP 6ML APPLICATOR 50/CS (WOUND CARE) ×2 IMPLANT
ELECT REM PT RETURN 9FT ADLT (ELECTROSURGICAL)
ELECTRODE REM PT RTRN 9FT ADLT (ELECTROSURGICAL) IMPLANT
GLOVE BIOGEL PI IND STRL 7.0 (GLOVE) IMPLANT
GLOVE BIOGEL PI INDICATOR 7.0 (GLOVE)
GLOVE EXAM NITRILE MD LF STRL (GLOVE) ×2 IMPLANT
GLOVE SURG SS PI 7.5 STRL IVOR (GLOVE) ×2 IMPLANT
GOWN STRL REUS W/TWL LRG LVL3 (GOWN DISPOSABLE) ×2 IMPLANT
LIQUID BAND (GAUZE/BANDAGES/DRESSINGS) IMPLANT
NEEDLE HYPO 25X1 1.5 SAFETY (NEEDLE) ×2 IMPLANT
PENCIL HANDSWITCHING (ELECTRODE) IMPLANT
SPONGE GAUZE 2X2 8PLY STRL LF (GAUZE/BANDAGES/DRESSINGS) IMPLANT
SUT VIC AB 3-0 SH 27 (SUTURE) ×1
SUT VIC AB 3-0 SH 27X BRD (SUTURE) ×1 IMPLANT
SUT VIC AB 4-0 PS2 27 (SUTURE) ×2 IMPLANT
SYR CONTROL 10ML LL (SYRINGE) ×2 IMPLANT
TOWEL OR 17X26 4PK STRL BLUE (TOWEL DISPOSABLE) IMPLANT

## 2016-03-22 NOTE — Discharge Instructions (Signed)
Apply ice to wound as needed.  May shower this evening.

## 2016-03-22 NOTE — Interval H&P Note (Signed)
History and Physical Interval Note:  03/22/2016 8:14 AM  Roberta Gonzalez  has presented today for surgery, with the diagnosis of left breast cancer  The various methods of treatment have been discussed with the patient and family. After consideration of risks, benefits and other options for treatment, the patient has consented to  Procedure(s) with comments: MINOR REMOVAL PORT-A-CATH (Right) - 9:00AM Dr. Fransisco Beau. Time as a surgical intervention .  The patient's history has been reviewed, patient examined, no change in status, stable for surgery.  I have reviewed the patient's chart and labs.  Questions were answered to the patient's satisfaction.     Aviva Signs A

## 2016-03-22 NOTE — Op Note (Signed)
Patient:  Roberta Gonzalez  DOB:  07/17/1941  MRN:  [id number]   Preop Diagnosis:  Left breast carcinoma, finished with chemotherapy  Postop Diagnosis:  Same  Procedure:  Port-A-Cath removal  Surgeon:  Aviva Signs, M.D.  Anes:  Local  Indications:  Patient is a 75 year old white female who presents for Port-A-Cath removal. The risks and benefits of the procedure were fully explained to the patient, who gave informed consent.  Procedure note:  The patient was placed the supine position. The right upper chest was prepped and draped using the usual sterile technique with DuraPrep. Surgical site confirmation was performed. 1% Xylocaine was used for local anesthesia.  An incision was made through the previous surgical incision site. Dissection was taken down to the port. The port was removed in total without difficulty. It was disposed of. The subcutaneous layer was reapproximated using 3-0 Vicryl interrupted sutures. The skin was closed using a 4-0 Vicryl subcuticular suture. Dermabond was then applied.  All tape and needle counts were correct at the end of the procedure. Patient was discharged from the minor procedure room in good and stable condition.  Complications:  None  EBL:  Minimal  Specimen:  None

## 2016-03-24 ENCOUNTER — Encounter (HOSPITAL_COMMUNITY): Payer: Self-pay | Admitting: General Surgery

## 2016-03-26 ENCOUNTER — Encounter (HOSPITAL_COMMUNITY): Payer: Self-pay | Admitting: Hematology & Oncology

## 2016-05-28 ENCOUNTER — Emergency Department (HOSPITAL_COMMUNITY): Payer: Medicare Other

## 2016-05-28 ENCOUNTER — Encounter (HOSPITAL_COMMUNITY): Payer: Self-pay | Admitting: Emergency Medicine

## 2016-05-28 ENCOUNTER — Emergency Department (HOSPITAL_COMMUNITY)
Admission: EM | Admit: 2016-05-28 | Discharge: 2016-05-28 | Disposition: A | Discharge status: Home / Self Care | Payer: Medicare Other | Attending: Emergency Medicine | Admitting: Emergency Medicine

## 2016-05-28 DIAGNOSIS — Z87891 Personal history of nicotine dependence: Secondary | ICD-10-CM | POA: Insufficient documentation

## 2016-05-28 DIAGNOSIS — F329 Major depressive disorder, single episode, unspecified: Secondary | ICD-10-CM | POA: Insufficient documentation

## 2016-05-28 DIAGNOSIS — Z853 Personal history of malignant neoplasm of breast: Secondary | ICD-10-CM | POA: Diagnosis not present

## 2016-05-28 DIAGNOSIS — Z79899 Other long term (current) drug therapy: Secondary | ICD-10-CM | POA: Insufficient documentation

## 2016-05-28 DIAGNOSIS — R109 Unspecified abdominal pain: Secondary | ICD-10-CM | POA: Diagnosis present

## 2016-05-28 LAB — CBC WITH DIFFERENTIAL/PLATELET
Basophils Absolute: 0 10*3/uL (ref 0.0–0.1)
Basophils Relative: 0 %
EOS ABS: 0.1 10*3/uL (ref 0.0–0.7)
EOS PCT: 1 %
HCT: 38 % (ref 36.0–46.0)
Hemoglobin: 12.4 g/dL (ref 12.0–15.0)
Lymphocytes Relative: 7 %
Lymphs Abs: 0.7 10*3/uL (ref 0.7–4.0)
MCH: 32.2 pg (ref 26.0–34.0)
MCHC: 32.6 g/dL (ref 30.0–36.0)
MCV: 98.7 fL (ref 78.0–100.0)
Monocytes Absolute: 0.6 10*3/uL (ref 0.1–1.0)
Monocytes Relative: 5 %
NEUTROS PCT: 87 %
Neutro Abs: 8.8 10*3/uL — ABNORMAL HIGH (ref 1.7–7.7)
PLATELETS: 256 10*3/uL (ref 150–400)
RBC: 3.85 MIL/uL — AB (ref 3.87–5.11)
RDW: 12 % (ref 11.5–15.5)
WBC: 10.1 10*3/uL (ref 4.0–10.5)

## 2016-05-28 LAB — URINALYSIS, ROUTINE W REFLEX MICROSCOPIC
BILIRUBIN URINE: NEGATIVE
GLUCOSE, UA: NEGATIVE mg/dL
Ketones, ur: NEGATIVE mg/dL
Leukocytes, UA: NEGATIVE
Nitrite: NEGATIVE
Protein, ur: NEGATIVE mg/dL
SPECIFIC GRAVITY, URINE: 1.025 (ref 1.005–1.030)
pH: 6 (ref 5.0–8.0)

## 2016-05-28 LAB — BASIC METABOLIC PANEL
Anion gap: 6 (ref 5–15)
BUN: 17 mg/dL (ref 6–20)
CHLORIDE: 108 mmol/L (ref 101–111)
CO2: 26 mmol/L (ref 22–32)
CREATININE: 0.64 mg/dL (ref 0.44–1.00)
Calcium: 8.9 mg/dL (ref 8.9–10.3)
GFR calc Af Amer: >60 mL/min (ref >60)
GLUCOSE: 128 mg/dL — AB (ref 65–99)
POTASSIUM: 3.9 mmol/L (ref 3.5–5.1)
Sodium: 140 mmol/L (ref 135–145)

## 2016-05-28 LAB — URINE MICROSCOPIC-ADD ON

## 2016-05-28 MED ORDER — KETOROLAC TROMETHAMINE 30 MG/ML IJ SOLN
30.0000 mg | Freq: Once | INTRAMUSCULAR | Status: AC
  Administered 2016-05-28: 30 mg via INTRAVENOUS
  Filled 2016-05-28: qty 1

## 2016-05-28 MED ORDER — MORPHINE SULFATE (PF) 4 MG/ML IV SOLN
4.0000 mg | Freq: Once | INTRAVENOUS | Status: DC
  Filled 2016-05-28: qty 1

## 2016-05-28 MED ORDER — MORPHINE SULFATE (PF) 2 MG/ML IV SOLN
2.0000 mg | Freq: Once | INTRAVENOUS | Status: AC
  Administered 2016-05-28: 2 mg via INTRAVENOUS
  Filled 2016-05-28: qty 1

## 2016-05-28 MED ORDER — MORPHINE SULFATE (PF) 4 MG/ML IV SOLN
4.0000 mg | Freq: Once | INTRAVENOUS | Status: AC
  Administered 2016-05-28: 4 mg via INTRAVENOUS
  Filled 2016-05-28: qty 1

## 2016-05-28 MED ORDER — KETOROLAC TROMETHAMINE 10 MG PO TABS: 10.0000 mg | ORAL_TABLET | Freq: Four times a day (QID) | ORAL | Status: DC | PRN

## 2016-05-28 MED ORDER — SODIUM CHLORIDE 0.9 % IV BOLUS (SEPSIS)
500.0000 mL | Freq: Once | INTRAVENOUS | Status: AC
  Administered 2016-05-28: 500 mL via INTRAVENOUS

## 2016-05-28 MED ORDER — ONDANSETRON HCL 4 MG/2ML IJ SOLN
4.0000 mg | Freq: Once | INTRAMUSCULAR | Status: AC
  Administered 2016-05-28: 4 mg via INTRAVENOUS
  Filled 2016-05-28: qty 2

## 2016-05-28 NOTE — ED Notes (Signed)
Pt c/o left side flank pain today. Reports feeling urge to urinate but unable to go.

## 2016-05-28 NOTE — ED Provider Notes (Signed)
CSN: [id number]     Arrival date & time 05/28/16  1145 History  By signing my name below, I, [name], attest that this documentation has been prepared under the direction and in the presence of Nat Christen, MD.  Electronically Signed: Tedra Coupe. Sheppard Coil, ED Scribe. 05/28/2016. 12:01 PM.   Chief Complaint  Patient presents with  . Flank Pain    The history is provided by the patient. No language interpreter was used.    HPI Comments: Roberta Gonzalez is a 75 y.o. female who presents to the Emergency Department complaining of sudden onset, constant, radiating left side flank pain to LLQ of abdomen x 3 hrs PTA. She reports that she was cleaning out her cat's litter box when the pain suddenly began. Pt has associated urinary urgency but unable to produce any urine. She notes that pain is exacerbated upon applying pressure. Denies any similar episodes in the past.   Past Medical History  Diagnosis Date  . GERD (gastroesophageal reflux disease) 06/10/2011  . Chest pain 06/10/2011  . Port catheter in place 11/29/2012  . Hypercholesteremia   . Depression   . Neuropathy ([place])   . Cancer ([place])     jan 2012/mastectomy L; chemo/rad tx  . 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 Breast cancer ([place])     right in 2000 in [place] and underwent a mastectomy.   Past Surgical History  Procedure Laterality Date  . Mastectomy    . Joint replacement    . Hip replacements    . Cranial shunt    . Abdominal hysterectomy    . Tonsillectomy    . Cataract extraction w/phaco Right 04/02/2013    Procedure: CATARACT EXTRACTION PHACO AND INTRAOCULAR LENS PLACEMENT (IOC);  Surgeon: [name], MD;  Location: [facility];  Service: Ophthalmology;  Laterality: Right;  CDE 22.44  . Cataract extraction w/phaco Left 04/26/2013    Procedure: CATARACT EXTRACTION PHACO AND INTRAOCULAR LENS PLACEMENT (IOC);  Surgeon: [name], MD;  Location: [facility];  Service: Ophthalmology;  Laterality: Left;  CDE:  23.60  . Esophagogastroduodenoscopy (egd) with esophageal dilation N/A 05/03/2013    Procedure: ESOPHAGOGASTRODUODENOSCOPY (EGD) WITH ESOPHAGEAL DILATION;  Surgeon: [name], MD;  Location: [facility];  Service: Endoscopy;  Laterality: N/A;  145-moved to 100 Ann to notify pt  . Repair of fractured penis Right last wednesday    Dr. Luna Glasgow  . Colonoscopy N/A 04/05/2014    Procedure: COLONOSCOPY;  Surgeon: [name], MD;  Location: [facility];  Service: Endoscopy;  Laterality: N/A;  11:15  . Port-a-cath removal Right 03/22/2016    Procedure: MINOR REMOVAL PORT-A-CATH RIGHT SUBCLAVIAN;  Surgeon: Aviva Signs, MD;  Location: [facility];  Service: General;  Laterality: Right;  start at 0841; stop at 0858   No family history on file. Social History  Substance Use Topics  . Smoking status: Former Research scientist (life sciences)  . Smokeless tobacco: Never Used     Comment: quit smoking over 20 yrs ago  . Alcohol Use: Yes     Comment: glass of liquor about twice a week   OB History    No data available     Review of Systems  Constitutional: Negative for fever.  Genitourinary: Positive for urgency and flank pain.  All other systems reviewed and are negative.  Allergies  Codeine; Percodan; and Sulfa antibiotics  Home Medications   Prior to Admission medications   Medication Sig Start Date End Date Taking? Authorizing Provider  Cholecalciferol 1000 UNITS tablet Take 2,000 Units by mouth every morning.    Yes Historical Provider, MD  escitalopram (LEXAPRO) 20 MG tablet TAKE ONE TABLET BY MOUTH ONCE DAILY 11/07/15  Yes Baird Cancer, PA-C  esomeprazole (NEXIUM) 20 MG capsule Take 20 mg by mouth daily.    Yes Historical Provider, MD  fexofenadine (ALLEGRA) 180 MG tablet Take 180 mg by mouth every morning.    Yes Historical Provider, MD  fish oil-omega-3 fatty acids 1000 MG capsule Take 1 g by mouth 2 (two) times daily.    Yes Historical Provider, MD  fluticasone (FLONASE) 50 MCG/ACT nasal spray Place  1 spray into both nostrils daily.   Yes Historical Provider, MD  pravastatin (PRAVACHOL) 40 MG tablet Take 40 mg by mouth at bedtime.    Yes Historical Provider, MD  Prenatal Vit-Fe Psac Cmplx-FA (PRENATAL MULTIVITAMIN) 60-1 MG tablet Take 1 tablet by mouth daily with breakfast.    Yes Historical Provider, MD  alendronate (FOSAMAX) 70 MG tablet Take 70 mg by mouth every 7 (seven) days. Take with a full glass of water on an empty stomach.(sunday)    Historical Provider, MD  Alum & Mag Hydroxide-Simeth (GI COCKTAIL) SUSP suspension Take 30 mLs by mouth daily as needed (stomach). Shake well.    Historical Provider, MD  diphenhydrAMINE (BENADRYL) 25 mg capsule Take 25 mg by mouth every 4 (four) hours as needed for allergies.    Historical Provider, MD  HYDROcodone-acetaminophen (NORCO/VICODIN) 5-325 MG per tablet Take 0.5-1 tablets by mouth daily as needed for severe pain.     Historical Provider, MD  ketorolac (TORADOL) 10 MG tablet Take 1 tablet (10 mg total) by mouth every 6 (six) hours as needed. 05/28/16   Nat Christen, MD  OVER THE COUNTER MEDICATION Place 1 drop into the left eye 2 (two) times daily. OTC Allergy eye drop    Historical Provider, MD  sucralfate (CARAFATE) 1 GM/10ML suspension Take 10 mLs (1 g total) by mouth daily as needed. Patient not taking: Reported on 05/28/2016 03/10/15   Manon Hilding Kefalas, PA-C   BP 92/64 mmHg  Pulse 107  Temp(Src) 98.1 F (36.7 C)  Resp 16  Ht 5\' 2"  (1.575 m)  Wt 110 lb (49.896 kg)  BMI 20.11 kg/m2  SpO2 93% Physical Exam  Constitutional: She is oriented to person, place, and time. She appears well-developed and well-nourished.  HENT:  Head: Normocephalic and atraumatic.  Eyes: Conjunctivae are normal.  Neck: Neck supple.  Cardiovascular: Normal rate and regular rhythm.   Pulmonary/Chest: Effort normal and breath sounds normal.  Abdominal: Soft. Bowel sounds are normal. There is tenderness.  Tender in left flank and LLQ  Musculoskeletal: Normal range  of motion.  Neurological: She is alert and oriented to person, place, and time.  Skin: Skin is warm and dry.  Psychiatric: She has a normal mood and affect. Her behavior is normal.  Nursing note and vitals reviewed.   ED Course  Procedures (including critical care time) DIAGNOSTIC STUDIES: Oxygen Saturation is 97% on RA, normal by my interpretation.    COORDINATION OF CARE: 12:00 PM-Discussed treatment plan which includes Renal CT, CBC, CMP, and UA with pt at bedside and pt agreed to plan. Will order Zofran, Toradol, and Morphine. Possible kidney stone.   Labs Review Labs Reviewed  CBC WITH DIFFERENTIAL/PLATELET - Abnormal; Notable for the following:    RBC 3.85 (*)    Neutro Abs 8.8 (*)    All other components within normal limits  BASIC METABOLIC PANEL - Abnormal; Notable for the following:    Glucose, Bld 128 (*)    All other components within normal limits  URINALYSIS, ROUTINE W REFLEX MICROSCOPIC (NOT AT [facility]) - Abnormal; Notable for the following:    Hgb urine dipstick TRACE (*)    All other components within normal limits  URINE MICROSCOPIC-ADD ON - Abnormal; Notable for the following:    Squamous Epithelial / LPF 0-5 (*)    Bacteria, UA RARE (*)    All other components within normal limits    Imaging Review Dg Abd 1 View  05/28/2016  CLINICAL DATA:  Left flank pain EXAM: ABDOMEN - 1 VIEW COMPARISON:  CT abdomen and pelvis May 28, 2016 FINDINGS: There are multiple small calcifications in the pelvis. Suspect phleboliths, although a small distal ureteral calculus cannot be excluded. No other abnormal calcifications are evident. There is no bowel dilatation or air-fluid level suggesting bowel obstruction. No free air. There are total hip replacements bilaterally. There is evidence of an abandoned catheter fragment in the pelvis. There is lumbar dextroscoliosis. Lung bases are clear. IMPRESSION: Multiple small calcifications in the pelvis. Suspect phleboliths, although a small  distal ureteral calculus cannot be excluded. Bowel gas pattern unremarkable. Abandoned catheter fragment in pelvis. Lumbar dextroscoliosis. Lung bases clear. No free air evident. Electronically Signed   By: Lowella Grip III M.D.   On: 05/28/2016 15:53   Ct Renal Stone Study  05/28/2016  CLINICAL DATA:  Left flank pain starting today. Patient is unable to urinate. History of treated breast cancer. EXAM: CT ABDOMEN AND PELVIS WITHOUT CONTRAST TECHNIQUE: Multidetector CT imaging of the abdomen and pelvis was performed following the standard protocol without IV contrast. COMPARISON:  None. FINDINGS: Lower chest: Linear opacities in the left lower lobe likely represent atelectasis versus scarring. Hepatobiliary: No mass visualized on this un-enhanced exam. Pancreas: No mass or inflammatory process identified on this un-enhanced exam. Spleen: Within normal limits in size. Adrenals/Urinary Tract: No evidence of urolithiasis or hydronephrosis. There is mild left pelviectasis and mild dilation of the left proximal ureter, with mild associated indistinctness of the left ureteral wall. The distal ureter is obscured by beam hardening artifact from hip prosthesis. No definite mass visualized on this un-enhanced exam. There is cortical thinning of the mid to inferior cortex of the left kidney, likely representing scarring. There is a low attenuation right adrenal mass measuring 14 mm, which likely represents adrenal adenomas. Stomach/Bowel: No evidence of obstruction, inflammatory process, or abnormal fluid collections. Vascular/Lymphatic: No pathologically enlarged lymph nodes. No evidence of abdominal aortic aneurysm. Reproductive: Female genitalia is obscured by beam hardening artifact from bilateral hip arthroplasty hardware. Other: There is a curvilinear hyperdense structure within the pelvis with the appearance of an abandoned catheter. Musculoskeletal: No suspicious bone lesions identified. Osteoarthritic changes of  the lumbosacral spine. IMPRESSION: Minimal left pelviectasis and dilation of the left ureter with mild indistinctness of the left ureteral wall, nonspecific findings which may be seen with inflammatory changes or partial distal left ureteral obstruction. The distal portion of the left ureter is obscured by beam hardening artifact caused by hip prosthesis hardware. The urinary bladder is obscured by the same artifact. 14 mm low-attenuation right adrenal mass, which likely represents adrenal adenoma. Curvilinear radiodense structure within the pelvis with appearance of sn abandoned catheter, incompletely visualized due to beam hardening artifact. Please correlate clinically. These results were called by telephone at the time of interpretation on 05/28/2016 at 1:40 pm to Dr. Nat Christen ,  who verbally acknowledged these results. Electronically Signed   By: Fidela Salisbury M.D.   On: 05/28/2016 13:46   I have personally reviewed and evaluated these images and lab results as part of my medical decision-making.   MDM   Final diagnoses:  Left flank pain    CT scan and KUB were inconclusive for for kidney stone. However history and physical suggest kidney stone pain. Urinalysis has a trace of hemoglobin, but no infection. Patient was given pain management in the ED. These findings were discussed with the patient and her daughter. Discharge medication Toradol orally. She has opiates at home which do not agree with her. I recommend follow-up at [facility] if symptoms persist. Otherwise, urology follow-up as outpatient.  I personally performed the services described in this documentation, which was scribed in my presence. The recorded information has been reviewed and is accurate.     Nat Christen, MD 05/28/16 [phone]

## 2016-05-28 NOTE — Discharge Instructions (Signed)
Prescription for additional pain medicine. Also take your opiate products. Follow-up with urology. Phone number given. If symptoms become unbearable, recommend follow-up at [facility] emergency department.

## 2016-06-20 NOTE — Progress Notes (Signed)
06/21/2016 3:03 PM   Roberta Gonzalez May 19, 1941 [id number]  Referring provider: [name], MD [place], [place] [address]  Chief Complaint  Patient presents with  . Nephrolithiasis    referred by ER    HPI: Patient is a 75 year old Caucasian female who presents today as a referral from [facility] ED for a possible left renal stone.    Patient states the onset of the pain was 3 weeks ago.   It was sharp.  It lasted all day.  The pain was located left and radiated to left waist.  The pain was a 10/10.  Nothing made the pain better.   Nothing made the pain worse.  She did not have gross hematuria, fevers, chills, nausea or vomiting.  In the ED, she was given pain meds and IV fluids.  UA noted 0-5 RBC's/hpf.   Serum creatinine 0.64.    Imaging studies noted minimal left pelviectasis and dilation of the left ureter with mild indistinctness of the left ureteral wall, nonspecific findings which may be seen with inflammatory changes or partial distal left ureteral obstruction. The distal portion of the left ureter is obscured by beam hardening artifact caused by hip prosthesis hardware. The urinary bladder is obscured by the same artifact.   Today, she has not had any further flank pain.   She has not had gross hematuria.  She denies fevers, chills, nausea and vomiting.  UA today 0-2 RBC's/hpf.    She does not have a prior history of stones.       PMH: Past Medical History:  Diagnosis Date  . Arthritis   . Breast cancer ([place])    right in 2000 in [place] and underwent a mastectomy.  . Cancer ([place])    jan 2012/mastectomy L; chemo/rad tx  . Chest pain 06/10/2011  . Depression   . GERD (gastroesophageal reflux disease) 06/10/2011  . Hypercholesteremia   . 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 Neuropathy ([place])   . Osteoporosis   . Port catheter in place 11/29/2012    Surgical History: Past Surgical History:  Procedure Laterality Date    . ABDOMINAL HYSTERECTOMY    . CATARACT EXTRACTION W/PHACO Right 04/02/2013   Procedure: CATARACT EXTRACTION PHACO AND INTRAOCULAR LENS PLACEMENT (IOC);  Surgeon: [name], MD;  Location: [facility];  Service: Ophthalmology;  Laterality: Right;  CDE 22.44  . CATARACT EXTRACTION W/PHACO Left 04/26/2013   Procedure: CATARACT EXTRACTION PHACO AND INTRAOCULAR LENS PLACEMENT (IOC);  Surgeon: [name], MD;  Location: [facility] OR[facility];  Service: Ophthalmology;  Laterality: Left;  CDE: 23.60  . COLONOSCOPY N/A 04/05/2014   Procedure: COLONOSCOPY;  Surgeon: [name], MD;  Location: [facility];  Service: Endoscopy;  Laterality: N/A;  11:15  . cranial shunt    . ESOPHAGOGASTRODUODENOSCOPY (EGD) WITH ESOPHAGEAL DILATION N/A 05/03/2013   Procedure: ESOPHAGOGASTRODUODENOSCOPY (EGD) WITH ESOPHAGEAL DILATION;  Surgeon: [name], MD;  Location: [facility];  Service: Endoscopy;  Laterality: N/A;  145-moved to 100 Ann to notify pt  . hip replacements    . JOINT REPLACEMENT    . MASTECTOMY    . PORT-A-CATH REMOVAL Right 03/22/2016   Procedure: MINOR REMOVAL PORT-A-CATH RIGHT SUBCLAVIAN;  Surgeon: Aviva Signs, MD;  Location: [facility];  Service: General;  Laterality: Right;  start at 0841; stop at 0858  . TONSILLECTOMY      Home Medications:    Medication List       Accurate  as of 06/21/16  3:03 PM. Always use your most recent med list.          alendronate 70 MG tablet Commonly known as:  FOSAMAX Take 70 mg by mouth every 7 (seven) days. Take with a full glass of water on an empty stomach.(sunday)   Cholecalciferol 1000 units tablet Take 2,000 Units by mouth every morning.   diphenhydrAMINE 25 mg capsule Commonly known as:  BENADRYL Take 25 mg by mouth every 4 (four) hours as needed for allergies.   escitalopram 20 MG tablet Commonly known as:  LEXAPRO TAKE ONE TABLET BY MOUTH ONCE DAILY   esomeprazole 20 MG capsule Commonly known as:  NEXIUM Take 20 mg by mouth daily.   fexofenadine 180  MG tablet Commonly known as:  ALLEGRA Take 180 mg by mouth every morning.   fish oil-omega-3 fatty acids 1000 MG capsule Take 1 g by mouth 2 (two) times daily.   fluticasone 50 MCG/ACT nasal spray Commonly known as:  FLONASE Place 1 spray into both nostrils daily.   gi cocktail Susp suspension Take 30 mLs by mouth daily as needed (stomach). Shake well.   HYDROcodone-acetaminophen 5-325 MG tablet Commonly known as:  NORCO/VICODIN Take 0.5-1 tablets by mouth daily as needed for severe pain.   ketorolac 10 MG tablet Commonly known as:  TORADOL Take 1 tablet (10 mg total) by mouth every 6 (six) hours as needed.   OVER THE COUNTER MEDICATION Place 1 drop into the left eye 2 (two) times daily. OTC Allergy eye drop   pravastatin 40 MG tablet Commonly known as:  PRAVACHOL Take 40 mg by mouth at bedtime.   prenatal multivitamin 60-1 MG tablet Take 1 tablet by mouth daily with breakfast.   sucralfate 1 GM/10ML suspension Commonly known as:  CARAFATE Take 10 mLs (1 g total) by mouth daily as needed.       Allergies:  Allergies  Allergen Reactions  . Codeine Other (See Comments)    Makes pt nervous  . Percodan [Oxycodone-Aspirin] Other (See Comments)    Mood change (climbing the walls, angry, irritable)  . Sulfa Antibiotics Nausea Only    Family History: Family History  Problem Relation Age of Onset  . Kidney disease Neg Hx   . Bladder Cancer Neg Hx     Social History:  reports that she has quit smoking. She has never used smokeless tobacco. She reports that she drinks alcohol. She reports that she does not use drugs.  ROS: UROLOGY Frequent Urination?: No Hard to postpone urination?: No Burning/pain with urination?: No Get up at night to urinate?: No Leakage of urine?: No Urine stream starts and stops?: No Trouble starting stream?: Yes Do you have to strain to urinate?: No Blood in urine?: No Urinary tract infection?: No Sexually transmitted disease?:  No Injury to kidneys or bladder?: No Painful intercourse?: No Weak stream?: No Currently pregnant?: No Vaginal bleeding?: No Last menstrual period?: n  Gastrointestinal Nausea?: No Vomiting?: No Indigestion/heartburn?: Yes Diarrhea?: No Constipation?: Yes  Constitutional Fever: No Night sweats?: No Weight loss?: Yes Fatigue?: Yes  Skin Skin rash/lesions?: No Itching?: No  Eyes Blurred vision?: No Double vision?: No  Ears/Nose/Throat Sore throat?: Yes Sinus problems?: Yes  Hematologic/Lymphatic Swollen glands?: No Easy bruising?: No  Cardiovascular Leg swelling?: No Chest pain?: No  Respiratory Cough?: No Shortness of breath?: No  Endocrine Excessive thirst?: No  Musculoskeletal Back pain?: Yes Joint pain?: Yes  Neurological Headaches?: Yes Dizziness?: No  Psychologic Depression?: Yes Anxiety?: No  Physical Exam: BP 110/66   Pulse 73   Ht 5\' 4"  (1.626 m)   Wt 103 lb 1.6 oz (46.8 kg)   BMI 17.70 kg/m   Constitutional: Well nourished. Alert and oriented, No acute distress. HEENT: [place] AT, moist mucus membranes. Trachea midline, no masses. Cardiovascular: No clubbing, cyanosis, or edema. Respiratory: Normal respiratory effort, no increased work of breathing. GI: Abdomen is soft, non tender, non distended, no abdominal masses. Liver and spleen not palpable.  No hernias appreciated.  Stool sample for occult testing is not indicated.   GU: No CVA tenderness.  No bladder fullness or masses.   Skin: No rashes, bruises or suspicious lesions. Lymph: No cervical or inguinal adenopathy. Neurologic: Grossly intact, no focal deficits, moving all 4 extremities. Psychiatric: Normal mood and affect.  Laboratory Data: Lab Results  Component Value Date   WBC 10.1 05/28/2016   HGB 12.4 05/28/2016   HCT 38.0 05/28/2016   MCV 98.7 05/28/2016   PLT 256 05/28/2016    Lab Results  Component Value Date   CREATININE 0.64 05/28/2016    Lab Results   Component Value Date   TSH 0.675 08/23/2014       Component Value Date/Time   CHOL 131 08/23/2014 1135   HDL 56 08/23/2014 1135   CHOLHDL 2.3 08/23/2014 1135   VLDL 21 08/23/2014 1135   LDLCALC 54 08/23/2014 1135    Lab Results  Component Value Date   AST 16 02/19/2016   Lab Results  Component Value Date   ALT 14 02/19/2016    Urinalysis Not significant for hematuria.  See EPIC.    Pertinent Imaging: CLINICAL DATA:  Left flank pain starting today. Patient is unable to urinate. History of treated breast cancer.  EXAM: CT ABDOMEN AND PELVIS WITHOUT CONTRAST  TECHNIQUE: Multidetector CT imaging of the abdomen and pelvis was performed following the standard protocol without IV contrast.  COMPARISON:  None.  FINDINGS: Lower chest: Linear opacities in the left lower lobe likely represent atelectasis versus scarring.  Hepatobiliary: No mass visualized on this un-enhanced exam.  Pancreas: No mass or inflammatory process identified on this un-enhanced exam.  Spleen: Within normal limits in size.  Adrenals/Urinary Tract: No evidence of urolithiasis or hydronephrosis. There is mild left pelviectasis and mild dilation of the left proximal ureter, with mild associated indistinctness of the left ureteral wall. The distal ureter is obscured by beam hardening artifact from hip prosthesis. No definite mass visualized on this un-enhanced exam. There is cortical thinning of the mid to inferior cortex of the left kidney, likely representing scarring. There is a low attenuation right adrenal mass measuring 14 mm, which likely represents adrenal adenomas.  Stomach/Bowel: No evidence of obstruction, inflammatory process, or abnormal fluid collections.  Vascular/Lymphatic: No pathologically enlarged lymph nodes. No evidence of abdominal aortic aneurysm.  Reproductive: Female genitalia is obscured by beam hardening artifact from bilateral hip arthroplasty  hardware.  Other: There is a curvilinear hyperdense structure within the pelvis with the appearance of an abandoned catheter.  Musculoskeletal: No suspicious bone lesions identified. Osteoarthritic changes of the lumbosacral spine.  IMPRESSION: Minimal left pelviectasis and dilation of the left ureter with mild indistinctness of the left ureteral wall, nonspecific findings which may be seen with inflammatory changes or partial distal left ureteral obstruction. The distal portion of the left ureter is obscured by beam hardening artifact caused by hip prosthesis hardware. The urinary bladder is obscured by the same artifact.  14 mm low-attenuation right adrenal mass, which likely  represents adrenal adenoma.  Curvilinear radiodense structure within the pelvis with appearance of sn abandoned catheter, incompletely visualized due to beam hardening artifact. Please correlate clinically.  These results were called by telephone at the time of interpretation on 05/28/2016 at 1:40 pm to Dr. Nat Christen , who verbally acknowledged these results.   Electronically Signed   By: Fidela Salisbury M.D.   On: 05/28/2016 13:46  Assessment & Plan:    1. Left hydronephrosis:   Patient was found to have left hydronephrosis due to probable passage of a left ureteral stone.   A RUS will be obtained to ensure the hydronephrosis has resolved.    2. Microscopic hematuria:   We will continue to monitor the patient's UA after the treatment/passage of the stone to ensure the hematuria has resolved.  If hematuria persists, we will pursue a hematuria workup with CT Urogram and cystoscopy if appropriate.  No hematuria on today's exam.    3. Passage of left ureteral stone:   Hip artifact obscured CT.  UA negative for hematuria.  Obtain RUS to ensure hydronephrosis has resolved.  Encourage patient to increase water intake.     Return for RUS report.  These notes generated with voice recognition  software. I apologize for typographical errors.  Zara Council, [facility] [address] [place], [place] [address] [phone]

## 2016-06-21 ENCOUNTER — Encounter: Payer: Self-pay | Admitting: Urology

## 2016-06-21 ENCOUNTER — Ambulatory Visit (INDEPENDENT_AMBULATORY_CARE_PROVIDER_SITE_OTHER): Payer: Medicare Other | Admitting: Urology

## 2016-06-21 VITALS — BP 110/66 | HR 73 | Ht 64.0 in | Wt 103.1 lb

## 2016-06-21 DIAGNOSIS — N200 Calculus of kidney: Secondary | ICD-10-CM

## 2016-06-21 DIAGNOSIS — R3129 Other microscopic hematuria: Secondary | ICD-10-CM

## 2016-06-21 DIAGNOSIS — N132 Hydronephrosis with renal and ureteral calculous obstruction: Secondary | ICD-10-CM | POA: Diagnosis not present

## 2016-06-22 LAB — URINALYSIS, COMPLETE
BILIRUBIN UA: NEGATIVE
Glucose, UA: NEGATIVE
KETONES UA: NEGATIVE
LEUKOCYTES UA: NEGATIVE
Nitrite, UA: NEGATIVE
PROTEIN UA: NEGATIVE
RBC UA: NEGATIVE
SPEC GRAV UA: 1.02 (ref 1.005–1.030)
UUROB: 0.2 mg/dL (ref 0.2–1.0)
pH, UA: 6.5 (ref 5.0–7.5)

## 2016-06-22 LAB — MICROSCOPIC EXAMINATION
Bacteria, UA: NONE SEEN
WBC UA: NONE SEEN /HPF (ref 0–?)

## 2016-06-23 LAB — CULTURE, URINE COMPREHENSIVE

## 2016-06-29 ENCOUNTER — Ambulatory Visit
Admission: RE | Admit: 2016-06-29 | Discharge: 2016-06-29 | Disposition: A | Discharge status: Home / Self Care | Payer: Medicare Other | Source: Ambulatory Visit | Attending: Urology | Admitting: Urology

## 2016-06-29 DIAGNOSIS — N132 Hydronephrosis with renal and ureteral calculous obstruction: Secondary | ICD-10-CM

## 2016-06-29 DIAGNOSIS — N281 Cyst of kidney, acquired: Secondary | ICD-10-CM | POA: Insufficient documentation

## 2016-07-06 ENCOUNTER — Ambulatory Visit: Payer: Medicare Other | Admitting: Urology

## 2016-07-06 NOTE — Progress Notes (Deleted)
07/06/2016 12:10 AM   Roberta Gonzalez [birth year] [id number]  Referring provider: [name], MD [place], [place] [address]  No chief complaint on file.   HPI: Patient is a 75 year old Caucasian female who presents today as a referral from [facility] ED for a possible left renal stone.    Patient states the onset of the pain was 3 weeks ago.   It was sharp.  It lasted all day.  The pain was located left and radiated to left waist.  The pain was a 10/10.  Nothing made the pain better.   Nothing made the pain worse.  She did not have gross hematuria, fevers, chills, nausea or vomiting.  In the ED, she was given pain meds and IV fluids.  UA noted 0-5 RBC's/hpf.   Serum creatinine 0.64.    Imaging studies noted minimal left pelviectasis and dilation of the left ureter with mild indistinctness of the left ureteral wall, nonspecific findings which may be seen with inflammatory changes or partial distal left ureteral obstruction. The distal portion of the left ureter is obscured by beam hardening artifact caused by hip prosthesis hardware. The urinary bladder is obscured by the same artifact.   Today, she has not had any further flank pain.   She has not had gross hematuria.  She denies fevers, chills, nausea and vomiting.  UA today 0-2 RBC's/hpf.    She does not have a prior history of stones.       PMH: Past Medical History:  Diagnosis Date  . Arthritis   . Breast cancer ([place])    right in 2000 in [place] and underwent a mastectomy.  . Cancer ([place])    jan 2012/mastectomy L; chemo/rad tx  . Chest pain 06/10/2011  . Depression   . GERD (gastroesophageal reflux disease) 06/10/2011  . Hypercholesteremia   . 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 Neuropathy ([place])   . Osteoporosis   . Port catheter in place 11/29/2012    Surgical History: Past Surgical History:  Procedure Laterality Date  . ABDOMINAL HYSTERECTOMY    . CATARACT EXTRACTION  W/PHACO Right 04/02/2013   Procedure: CATARACT EXTRACTION PHACO AND INTRAOCULAR LENS PLACEMENT (IOC);  Surgeon: [name], MD;  Location: [facility];  Service: Ophthalmology;  Laterality: Right;  CDE 22.44  . CATARACT EXTRACTION W/PHACO Left 04/26/2013   Procedure: CATARACT EXTRACTION PHACO AND INTRAOCULAR LENS PLACEMENT (IOC);  Surgeon: [name], MD;  Location: [facility];  Service: Ophthalmology;  Laterality: Left;  CDE: 23.60  . COLONOSCOPY N/A 04/05/2014   Procedure: COLONOSCOPY;  Surgeon: [name], MD;  Location: [facility] SU[facility];  Service: Endoscopy;  Laterality: N/A;  11:15  . cranial shunt    . ESOPHAGOGASTRODUODENOSCOPY (EGD) WITH ESOPHAGEAL DILATION N/A 05/03/2013   Procedure: ESOPHAGOGASTRODUODENOSCOPY (EGD) WITH ESOPHAGEAL DILATION;  Surgeon: [name], MD;  Location: [facility];  Service: Endoscopy;  Laterality: N/A;  145-moved to 100 Ann to notify pt  . hip replacements    . JOINT REPLACEMENT    . MASTECTOMY    . PORT-A-CATH REMOVAL Right 03/22/2016   Procedure: MINOR REMOVAL PORT-A-CATH RIGHT SUBCLAVIAN;  Surgeon: Aviva Signs, MD;  Location: [facility] ORS;  Service: General;  Laterality: Right;  start at 0841; stop at 0858  . TONSILLECTOMY      Home Medications:    Medication List       Accurate as of 07/06/16 12:10 AM. Always use your most recent med list.  alendronate 70 MG tablet Commonly known as:  FOSAMAX Take 70 mg by mouth every 7 (seven) days. Take with a full glass of water on an empty stomach.(sunday)   Cholecalciferol 1000 units tablet Take 2,000 Units by mouth every morning.   diphenhydrAMINE 25 mg capsule Commonly known as:  BENADRYL Take 25 mg by mouth every 4 (four) hours as needed for allergies.   escitalopram 20 MG tablet Commonly known as:  LEXAPRO TAKE ONE TABLET BY MOUTH ONCE DAILY   esomeprazole 20 MG capsule Commonly known as:  NEXIUM Take 20 mg by mouth daily.   fexofenadine 180 MG tablet Commonly known as:  ALLEGRA Take 180 mg  by mouth every morning.   fish oil-omega-3 fatty acids 1000 MG capsule Take 1 g by mouth 2 (two) times daily.   fluticasone 50 MCG/ACT nasal spray Commonly known as:  FLONASE Place 1 spray into both nostrils daily.   gi cocktail Susp suspension Take 30 mLs by mouth daily as needed (stomach). Shake well.   HYDROcodone-acetaminophen 5-325 MG tablet Commonly known as:  NORCO/VICODIN Take 0.5-1 tablets by mouth daily as needed for severe pain.   ketorolac 10 MG tablet Commonly known as:  TORADOL Take 1 tablet (10 mg total) by mouth every 6 (six) hours as needed.   OVER THE COUNTER MEDICATION Place 1 drop into the left eye 2 (two) times daily. OTC Allergy eye drop   pravastatin 40 MG tablet Commonly known as:  PRAVACHOL Take 40 mg by mouth at bedtime.   prenatal multivitamin 60-1 MG tablet Take 1 tablet by mouth daily with breakfast.   sucralfate 1 GM/10ML suspension Commonly known as:  CARAFATE Take 10 mLs (1 g total) by mouth daily as needed.       Allergies:  Allergies  Allergen Reactions  . Codeine Other (See Comments)    Makes pt nervous  . Percodan [Oxycodone-Aspirin] Other (See Comments)    Mood change (climbing the walls, angry, irritable)  . Sulfa Antibiotics Nausea Only    Family History: Family History  Problem Relation Age of Onset  . Kidney disease Neg Hx   . Bladder Cancer Neg Hx     Social History:  reports that she has quit smoking. She has never used smokeless tobacco. She reports that she drinks alcohol. She reports that she does not use drugs.  ROS:                                        Physical Exam: There were no vitals taken for this visit.  Constitutional: Well nourished. Alert and oriented, No acute distress. HEENT: [place] AT, moist mucus membranes. Trachea midline, no masses. Cardiovascular: No clubbing, cyanosis, or edema. Respiratory: Normal respiratory effort, no increased work of breathing. GI: Abdomen is  soft, non tender, non distended, no abdominal masses. Liver and spleen not palpable.  No hernias appreciated.  Stool sample for occult testing is not indicated.   GU: No CVA tenderness.  No bladder fullness or masses.   Skin: No rashes, bruises or suspicious lesions. Lymph: No cervical or inguinal adenopathy. Neurologic: Grossly intact, no focal deficits, moving all 4 extremities. Psychiatric: Normal mood and affect.  Laboratory Data: Lab Results  Component Value Date   WBC 10.1 05/28/2016   HGB 12.4 05/28/2016   HCT 38.0 05/28/2016   MCV 98.7 05/28/2016   PLT 256 05/28/2016    Lab Results  Component Value Date   CREATININE 0.64 05/28/2016    Lab Results  Component Value Date   TSH 0.675 08/23/2014       Component Value Date/Time   CHOL 131 08/23/2014 1135   HDL 56 08/23/2014 1135   CHOLHDL 2.3 08/23/2014 1135   VLDL 21 08/23/2014 1135   LDLCALC 54 08/23/2014 1135    Lab Results  Component Value Date   AST 16 02/19/2016   Lab Results  Component Value Date   ALT 14 02/19/2016    Urinalysis Not significant for hematuria.  See EPIC.    Pertinent Imaging: CLINICAL DATA:  Hydronephrosis  EXAM: RENAL / URINARY TRACT ULTRASOUND COMPLETE  COMPARISON:  CT abdomen pelvis dated 05/28/2016  FINDINGS: Right Kidney:  Length: 10.9 cm. 4 mm lower pole calculus. Two renal cysts measuring up to 14 mm. No hydronephrosis.  Left Kidney:  Length: 10.7 cm.  No mass or hydronephrosis.  Bladder:  Within normal limits.  IMPRESSION: 4 mm right lower pole renal calculus.  Two right renal cysts measuring up to 14 mm.  No hydronephrosis.   Electronically Signed   By: Julian Hy M.D.   On: 06/29/2016 14:56   Assessment & Plan:    1. Left hydronephrosis:   Patient was found to have left hydronephrosis due to probable passage of a left ureteral stone.   A RUS will be obtained to ensure the hydronephrosis has resolved.    2. Microscopic  hematuria:   We will continue to monitor the patient's UA after the treatment/passage of the stone to ensure the hematuria has resolved.  If hematuria persists, we will pursue a hematuria workup with CT Urogram and cystoscopy if appropriate.  No hematuria on today's exam.    3. Passage of left ureteral stone:   Hip artifact obscured CT.  UA negative for hematuria.  Obtain RUS to ensure hydronephrosis has resolved.  Encourage patient to increase water intake.     No Follow-up on file.  These notes generated with voice recognition software. I apologize for typographical errors.  Zara Council, [facility] [address] [place], [place] [address] [phone]

## 2016-08-02 ENCOUNTER — Ambulatory Visit (INDEPENDENT_AMBULATORY_CARE_PROVIDER_SITE_OTHER): Payer: Medicare Other | Admitting: Urology

## 2016-08-02 ENCOUNTER — Encounter: Payer: Self-pay | Admitting: Urology

## 2016-08-02 VITALS — BP 103/62 | HR 77 | Ht 63.0 in | Wt 114.4 lb

## 2016-08-02 DIAGNOSIS — R3129 Other microscopic hematuria: Secondary | ICD-10-CM

## 2016-08-02 DIAGNOSIS — N132 Hydronephrosis with renal and ureteral calculous obstruction: Secondary | ICD-10-CM

## 2016-08-02 DIAGNOSIS — N200 Calculus of kidney: Secondary | ICD-10-CM | POA: Diagnosis not present

## 2016-08-02 NOTE — Progress Notes (Signed)
08/02/2016 10:29 AM   Roberta Gonzalez [birth year] [id number]  Referring provider: [name], MD [place], [place] [address]  Chief Complaint  Patient presents with  . Results    RUS    HPI: Patient is a 75 year old Caucasian female who presents today to discuss her renal ultrasound results after the probable passage of a left ureteral stone.  Background history Patient was a referral from [facility] ED for a possible left renal stone.  Patient stated the onset of the pain was 3 weeks ago.   It was sharp.  It lasted all day.  The pain was located left and radiated to left waist.  The pain was a 10/10.  Nothing made the pain better.   Nothing made the pain worse.  She did not have gross hematuria, fevers, chills, nausea or vomiting.  In the ED, she was given pain med's and IV fluids.  UA noted 0-5 RBC's/hpf.   Serum creatinine 0.64.  Imaging studies noted minimal left pelviectasis and dilation of the left ureter with mild indistinctness of the left ureteral wall, nonspecific findings which may be seen with inflammatory changes or partial distal left ureteral obstruction. The distal portion of the left ureter is obscured by beam hardening artifact caused by hip prosthesis hardware. The urinary bladder is obscured by the same artifact.  Today, she has not had any further flank pain.   She has not had gross hematuria.  She denies fevers, chills, nausea and vomiting.   She does not have a prior history of stones.    Renal ultrasound completed on 06/29/2016 noted a 4 mm right lower pole renal calculus, 2 right renal cysts measuring up to 14 mm and no hydronephrosis.  I have personally reviewed the films with the patient.    PMH: Past Medical History:  Diagnosis Date  . Arthritis   . Breast cancer ([place])    right in 2000 in [place] and underwent a mastectomy.  . Cancer ([place])    jan 2012/mastectomy L; chemo/rad tx  . Chest pain 06/10/2011  . Depression   . GERD  (gastroesophageal reflux disease) 06/10/2011  . Hypercholesteremia   . 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 Neuropathy ([place])   . Osteoporosis   . Port catheter in place 11/29/2012    Surgical History: Past Surgical History:  Procedure Laterality Date  . ABDOMINAL HYSTERECTOMY    . CATARACT EXTRACTION W/PHACO Right 04/02/2013   Procedure: CATARACT EXTRACTION PHACO AND INTRAOCULAR LENS PLACEMENT (IOC);  Surgeon: [name], MD;  Location: [facility];  Service: Ophthalmology;  Laterality: Right;  CDE 22.44  . CATARACT EXTRACTION W/PHACO Left 04/26/2013   Procedure: CATARACT EXTRACTION PHACO AND INTRAOCULAR LENS PLACEMENT (IOC);  Surgeon: [name], MD;  Location: [facility];  Service: Ophthalmology;  Laterality: Left;  CDE: 23.60  . COLONOSCOPY N/A 04/05/2014   Procedure: COLONOSCOPY;  Surgeon: [name], MD;  Location: [facility];  Service: Endoscopy;  Laterality: N/A;  11:15  . cranial shunt    . ESOPHAGOGASTRODUODENOSCOPY (EGD) WITH ESOPHAGEAL DILATION N/A 05/03/2013   Procedure: ESOPHAGOGASTRODUODENOSCOPY (EGD) WITH ESOPHAGEAL DILATION;  Surgeon: [name], MD;  Location: [facility];  Service: Endoscopy;  Laterality: N/A;  145-moved to 100 Ann to notify pt  . hip replacements Bilateral   . JOINT REPLACEMENT    . MASTECTOMY Bilateral   . PORT-A-CATH REMOVAL Right 03/22/2016   Procedure: MINOR REMOVAL PORT-A-CATH RIGHT SUBCLAVIAN;  Surgeon: Aviva Signs, MD;  Location: [facility];  Service: General;  Laterality: Right;  start at 0841; stop at 0858  . TONSILLECTOMY      Home Medications:    Medication List       Accurate as of 08/02/16 10:29 AM. Always use your most recent med list.          alendronate 70 MG tablet Commonly known as:  FOSAMAX Take 70 mg by mouth every 7 (seven) days. Take with a full glass of water on an empty stomach.(sunday)   Cholecalciferol 1000 units tablet Take 2,000 Units by mouth every morning.   diphenhydrAMINE 25 mg  capsule Commonly known as:  BENADRYL Take 25 mg by mouth every 4 (four) hours as needed for allergies.   escitalopram 20 MG tablet Commonly known as:  LEXAPRO TAKE ONE TABLET BY MOUTH ONCE DAILY   esomeprazole 20 MG capsule Commonly known as:  NEXIUM Take 20 mg by mouth daily.   fexofenadine 180 MG tablet Commonly known as:  ALLEGRA Take 180 mg by mouth every morning.   fish oil-omega-3 fatty acids 1000 MG capsule Take 1 g by mouth 2 (two) times daily.   fluticasone 50 MCG/ACT nasal spray Commonly known as:  FLONASE Place 1 spray into both nostrils daily.   gi cocktail Susp suspension Take 30 mLs by mouth daily as needed (stomach). Shake well.   HYDROcodone-acetaminophen 5-325 MG tablet Commonly known as:  NORCO/VICODIN Take 0.5-1 tablets by mouth daily as needed for severe pain.   ketorolac 10 MG tablet Commonly known as:  TORADOL Take 1 tablet (10 mg total) by mouth every 6 (six) hours as needed.   OVER THE COUNTER MEDICATION Place 1 drop into the left eye 2 (two) times daily. OTC Allergy eye drop   pravastatin 40 MG tablet Commonly known as:  PRAVACHOL Take 40 mg by mouth at bedtime.   prenatal multivitamin 60-1 MG tablet Take 1 tablet by mouth daily with breakfast.   sucralfate 1 GM/10ML suspension Commonly known as:  CARAFATE Take 10 mLs (1 g total) by mouth daily as needed.       Allergies:  Allergies  Allergen Reactions  . Codeine Other (See Comments)    Makes pt nervous  . Percodan [Oxycodone-Aspirin] Other (See Comments)    Mood change (climbing the walls, angry, irritable)  . Sulfa Antibiotics Nausea Only    Family History: Family History  Problem Relation Age of Onset  . Kidney disease Neg Hx   . Bladder Cancer Neg Hx     Social History:  reports that she has quit smoking. She has never used smokeless tobacco. She reports that she drinks alcohol. She reports that she does not use drugs.  ROS: UROLOGY Frequent Urination?: No Hard to  postpone urination?: No Burning/pain with urination?: No Get up at night to urinate?: No Leakage of urine?: No Urine stream starts and stops?: No Trouble starting stream?: No Do you have to strain to urinate?: No Blood in urine?: No Urinary tract infection?: No Sexually transmitted disease?: No Injury to kidneys or bladder?: No Painful intercourse?: No Weak stream?: No Currently pregnant?: No Vaginal bleeding?: No Last menstrual period?: n  Gastrointestinal Nausea?: No Vomiting?: No Indigestion/heartburn?: Yes Diarrhea?: No Constipation?: Yes  Constitutional Fever: No Night sweats?: No Weight loss?: No Fatigue?: No  Skin Skin rash/lesions?: No Itching?: No  Eyes Blurred vision?: No Double vision?: No  Ears/Nose/Throat Sore throat?: No Sinus problems?: Yes  Hematologic/Lymphatic Swollen glands?: No Easy bruising?: No  Cardiovascular Leg swelling?:  No Chest pain?: No  Respiratory Cough?: No Shortness of breath?: No  Endocrine Excessive thirst?: No  Musculoskeletal Back pain?: No Joint pain?: No  Neurological Headaches?: No Dizziness?: No  Psychologic Depression?: Yes Anxiety?: No  Physical Exam: BP 103/62   Pulse 77   Ht 5\' 3"  (1.6 m)   Wt 114 lb 6.4 oz (51.9 kg)   BMI 20.27 kg/m   Constitutional: Well nourished. Alert and oriented, No acute distress. HEENT: [place] AT, moist mucus membranes. Trachea midline, no masses. Cardiovascular: No clubbing, cyanosis, or edema. Respiratory: Normal respiratory effort, no increased work of breathing. GI: Abdomen is soft, non tender, non distended, no abdominal masses. Liver and spleen not palpable.  No hernias appreciated.  Stool sample for occult testing is not indicated.   GU: No CVA tenderness.  No bladder fullness or masses.   Skin: No rashes, bruises or suspicious lesions. Lymph: No cervical or inguinal adenopathy. Neurologic: Grossly intact, no focal deficits, moving all 4  extremities. Psychiatric: Normal mood and affect.  Laboratory Data: Lab Results  Component Value Date   WBC 10.1 05/28/2016   HGB 12.4 05/28/2016   HCT 38.0 05/28/2016   MCV 98.7 05/28/2016   PLT 256 05/28/2016    Lab Results  Component Value Date   CREATININE 0.64 05/28/2016    Lab Results  Component Value Date   TSH 0.675 08/23/2014       Component Value Date/Time   CHOL 131 08/23/2014 1135   HDL 56 08/23/2014 1135   CHOLHDL 2.3 08/23/2014 1135   VLDL 21 08/23/2014 1135   LDLCALC 54 08/23/2014 1135    Lab Results  Component Value Date   AST 16 02/19/2016   Lab Results  Component Value Date   ALT 14 02/19/2016    Pertinent Imaging: CLINICAL DATA:  Hydronephrosis  EXAM: RENAL / URINARY TRACT ULTRASOUND COMPLETE  COMPARISON:  CT abdomen pelvis dated 05/28/2016  FINDINGS: Right Kidney:  Length: 10.9 cm. 4 mm lower pole calculus. Two renal cysts measuring up to 14 mm. No hydronephrosis.  Left Kidney:  Length: 10.7 cm.  No mass or hydronephrosis.  Bladder:  Within normal limits.  IMPRESSION: 4 mm right lower pole renal calculus.  Two right renal cysts measuring up to 14 mm.  No hydronephrosis.   Electronically Signed   By: Julian Hy M.D.   On: 06/29/2016 14:56   Assessment & Plan:    1. Left hydronephrosis:   Patient's hydronephrosis has resolved.     2. Microscopic hematuria:   We will continue to monitor the patient's UA.  She will RTC in one year for recheck on UA.  She will contact us if she should experience gross hematuria.    3. Right renal stone  - RTC in one year for KUB and symptom recheck  - Advised to contact our office or seek treatment in the ED if becomes febrile or pain/ vomiting are difficult control in order to arrange for emergent/urgent intervention  Return in about 1 year (around 08/02/2017) for KUB and symptoms recheck.  These notes generated with voice recognition software. I apologize for  typographical errors.  Zara Council, [facility] [address], [place],  [address] [phone]

## 2016-08-23 ENCOUNTER — Ambulatory Visit (HOSPITAL_COMMUNITY): Payer: Medicare Other | Admitting: Oncology

## 2016-08-23 ENCOUNTER — Other Ambulatory Visit (HOSPITAL_COMMUNITY): Payer: Self-pay | Admitting: Oncology

## 2016-09-02 ENCOUNTER — Ambulatory Visit (HOSPITAL_COMMUNITY): Payer: Medicare Other

## 2016-09-16 ENCOUNTER — Encounter (HOSPITAL_COMMUNITY): Payer: Medicare Other | Attending: Oncology | Admitting: Oncology

## 2016-09-16 ENCOUNTER — Encounter (HOSPITAL_COMMUNITY): Payer: Self-pay | Admitting: Oncology

## 2016-09-16 VITALS — BP 118/36 | HR 94 | Temp 98.7°F | Resp 16 | Wt 116.6 lb

## 2016-09-16 DIAGNOSIS — Z171 Estrogen receptor negative status [ER-]: Secondary | ICD-10-CM

## 2016-09-16 DIAGNOSIS — M8580 Other specified disorders of bone density and structure, unspecified site: Secondary | ICD-10-CM

## 2016-09-16 DIAGNOSIS — C50919 Malignant neoplasm of unspecified site of unspecified female breast: Secondary | ICD-10-CM

## 2016-09-16 DIAGNOSIS — C50912 Malignant neoplasm of unspecified site of left female breast: Secondary | ICD-10-CM | POA: Diagnosis present

## 2016-09-16 DIAGNOSIS — M25552 Pain in left hip: Secondary | ICD-10-CM

## 2016-09-16 NOTE — Progress Notes (Signed)
Roberta Gonzalez, [place] [address]  No diagnosis found.  CURRENT THERAPY: Surveillance per NCCN guidelines  INTERVAL HISTORY: Roberta Gonzalez 75 y.o. 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 HER2 was positive with a ratio of 2.09. Primary was 2.1 cm. She had a 2nd lesion that was 0.4 cm. Underwent mastectomy 11/27/2010. She therefore had a T2 N1 MX cancer. She has had 6 cycles of carboplatin with Taxotere and 52 weeks of Herceptin (finishing on 12/03/2011).  Patient recently had hydronephrosis due to kidney stone had multiple ultrasound done at urology department in [facility]   Hydronephrosis  has resolved. She continues to have hip pain.  Had a bone scan done several months ago major abnormality was detected    Invasive ductal carcinoma of breast ([place])   10/28/2010 Initial Diagnosis    Breast, left, needle core biopsy, :  - INVASIVE DUCTAL CARCINOMA.      11/27/2010 Surgery    Left simple mastectomy: Multicentric invasive ductal cancer, 2.1 and 0.4 cm, +LVI, 2/8 positive lymph nodes.  ER-, PR-, HER2 3+, Ki-67 82%.      12/07/2010 - 05/03/2011 Chemotherapy    Carboplatin/Taxotere x 6 cycles (Approximate dates)      12/07/2010 - 12/03/2011 Antibody Plan    52 weeks of Herceptin (Aproximate start date)      05/05/2011 Remission           Port has been removed  She is still taking calcium and vitamin D every day.   Past Medical History:  Diagnosis Date  . Arthritis   . Breast cancer ([place])    right in 2000 in [place] and underwent a mastectomy.  . Cancer ([place])    jan 2012/mastectomy L; chemo/rad tx  . Chest pain 06/10/2011  . Depression   . GERD (gastroesophageal reflux disease) 06/10/2011  . Hypercholesteremia   . Invasive ductal carcinoma of  breast ([place]) 06/10/2011  . Neuropathy ([place])   . Osteoporosis   . Port catheter in place 11/29/2012    has Invasive ductal carcinoma of breast ([place]); GERD (gastroesophageal reflux disease); Chest pain; Port catheter in place; Personal history of colonic polyps; and Family hx of colon cancer on her problem list.     is allergic to codeine; percodan [oxycodone-aspirin]; and sulfa antibiotics.  Roberta Gonzalez had no medications administered during this visit.  Past Surgical History:  Procedure Laterality Date  . ABDOMINAL HYSTERECTOMY    . CATARACT EXTRACTION W/PHACO Right 04/02/2013   Procedure: CATARACT EXTRACTION PHACO AND INTRAOCULAR LENS PLACEMENT (IOC);  Surgeon: [name], MD;  Location: [facility] ORS;  Service: Ophthalmology;  Laterality: Right;  CDE 22.44  . CATARACT EXTRACTION W/PHACO Left 04/26/2013   Procedure: CATARACT EXTRACTION PHACO AND INTRAOCULAR LENS PLACEMENT (IOC);  Surgeon: [name], MD;  Location: [facility] ORS;  Service: Ophthalmology;  Laterality: Left;  CDE: 23.60  . COLONOSCOPY N/A 04/05/2014   Procedure: COLONOSCOPY;  Surgeon: [name], MD;  Location: [facility] ENDO SUITE;  Service: Endoscopy;  Laterality: N/A;  11:15  . cranial shunt    . ESOPHAGOGASTRODUODENOSCOPY (EGD) WITH ESOPHAGEAL DILATION N/A 05/03/2013   Procedure: ESOPHAGOGASTRODUODENOSCOPY (EGD) WITH ESOPHAGEAL DILATION;  Surgeon: [name], MD;  Location: [facility] END[facility] SU[facility];  Service: Endoscopy;  Laterality: N/A;  145-moved to 100 Roberta Gonzalez  to notify pt  . hip replacements Bilateral   . JOINT REPLACEMENT    . MASTECTOMY Bilateral   . PORT-A-CATH REMOVAL Right 03/22/2016   Procedure: MINOR REMOVAL PORT-A-CATH RIGHT SUBCLAVIAN;  Surgeon: Aviva Signs, MD;  Location: [facility] ORS;  Service: General;  Laterality: Right;  start at 0841; stop at 0858  . TONSILLECTOMY     Social History She was born in [place]. She has two daughters, five grandchildren, and two great-grandchildren. Her youngest daughter lives with her, along with her  grandson.  Positive for hip pain. Right hip pain for the past couple of weeks. Right buttocks swollen. Patient also had a kidney stone with hydronephrosis which is resolved Review of Systems  Denies any headaches, dizziness, double vision, fevers, chills, night sweats, nausea, vomiting, diarrhea, constipation, chest pain, heart palpitations, shortness of breath, blood in stool, black tarry stool, urinary pain, urinary burning, urinary frequency, hematuria.  14 point review of systems was performed and is negative except as detailed under history of present illness and above    PHYSICAL EXAMINATION  ECOG PERFORMANCE STATUS: 0 - Asymptomatic  There were no vitals filed for this visit.  GENERAL:alert, no distress, well nourished, well developed, comfortable, cooperative, smiling and accompanied by daughter  SKIN: skin color, texture, turgor are normal, no rashes or significant lesions HEAD: Normocephalic, No masses, lesions, tenderness or abnormalities EYES: normal, PERRLA, EOMI, Conjunctiva are pink and non-injected EARS: External ears normal OROPHARYNX:lips, buccal mucosa, and tongue normal and mucous membranes are moist  NECK: supple, no adenopathy, thyroid normal size, non-tender, without nodularity, no stridor, non-tender, trachea midline LYMPH:  no palpable lymphadenopathy BREAST:post-mastectomy bilaterally site well healed  Bilateral mastectomies; no palpable nodules  Left side has radiation changes, angioectasias LUNGS: clear to auscultation  HEART: regular rate & rhythm, no murmurs, no gallops, S1 normal and S2 normal ABDOMEN:abdomen soft, non-tender, normal bowel sounds and no masses or organomegaly BACK: Back symmetric, no curvature., No CVA tenderness EXTREMITIES:less then 2 second capillary refill, no joint deformities, effusion, or inflammation, no edema, no skin discoloration, no clubbing, no cyanosis  NEURO: alert & oriented x 3 with fluent speech, no focal  motor/sensory deficits, gait slow, abnormal   LABORATORY DATA: I have reviewed the data as listed.  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  Status post bilateral mastectomy without any evidence of recurrent disease  She mentions her R hip pain multiple times throughout her visit today. Given her disease histology I recommended a bone scan and she is agreeable.  She continues on Fosamax for her osteoporosis. This is managed by her primary care doctor. I have advised her to take calcium 1200 mg daily and between 1000 to 2000 international units of vitamin D.  Scan has been essentially negative there is some increased uptake in the left hip patient did have surgery before.  Pain is mainly localized on the right side.  No additional x-rays may be needed at this point in time  Return evaluation has been planned in 12 months or before THERAPY PLAN:  NCCN guidelines recommends the following surveillance for  invasive breast cancer:  A. History and Physical exam every 4-6 months for 5 years and then every 12 months.  B. Mammography every 12 months  C. Women on Tamoxifen: annual gynecologic assessment every 12 months if uterus is present.  D. Women on aromatase inhibitor or who experience ovarian failure secondary to treatment should have monitoring of bone health with a bone mineral density determination at baseline and periodically thereafter.  E. Assess and encourage adherence to adjuvant endocrine therapy.  F. Evidence suggests that active lifestyle and achieving and maintaining an ideal body weight (20-25 BMI) may lead to optimal breast cancer  outcomes.   Port   has-been removed  She will return for a follow up in 6 months. We can move her visits out to yearly at her next visit.    All questions were answered. The patient knows to call the clinic with any problems, questions or concerns. We can certainly see the patient much sooner if necessary.   This note is electronically signed by: Forest Gleason, MD  09/16/2016 9:55 AM

## 2016-09-16 NOTE — Patient Instructions (Signed)
[place] at [facility] Discharge Instructions  RECOMMENDATIONS MADE BY THE CONSULTANT AND ANY TEST RESULTS WILL BE SENT TO YOUR REFERRING PHYSICIAN.  Exam with Dr. Oliva Bustard today.   Normal breast tissue exam.    Thank you for choosing [place] at [facility] to provide your oncology and hematology care.  To afford each patient quality time with our provider, please arrive at least 15 minutes before your scheduled appointment time.   Beginning January 23rd 2017 lab work for the [facility] will be done in the  Main lab at [facility] on 1st floor. If you have a lab appointment with the [facility] please come in thru the  Main Entrance and check in at the main information desk  You need to re-schedule your appointment should you arrive 10 or more minutes late.  We strive to give you quality time with our providers, and arriving late affects you and other patients whose appointments are after yours.  Also, if you no show three or more times for appointments you may be dismissed from the clinic at the providers discretion.     Again, thank you for choosing [facility].  Our hope is that these requests will decrease the amount of time that you wait before being seen by our physicians.       _____________________________________________________________  Should you have questions after your visit to [facility], please contact our office at [phone] between the hours of 8:30 a.m. and 4:30 p.m.  Voicemails left after 4:30 p.m. will not be returned until the following business day.  For prescription refill requests, have your pharmacy contact our office.         Resources For Cancer Patients and their Caregivers ? American Cancer Society: Can assist with transportation, wigs, general needs, runs Look Good Feel Better.        ([phone] ? Cancer Care: Provides financial assistance, online support groups,  medication/co-pay assistance.  1-800-813-HOPE 309-393-1361) ? [place] Assists [place] cancer patients and their families through emotional , educational and financial support.  419-163-2303 ? [place] DSS Where to apply for food stamps, Medicaid and utility assistance. 2050658174 ? [facility]: Transportation to medical appointments. 4847554395 ? Social Security Administration: May apply for disability if have a Stage IV cancer. (919)628-1159 214-157-3716 ? [employer], Disability and Transit Services: Assists with nutrition, care and transit needs. [place] Support Programs: @10RELATIVEDAYS @ > Cancer Support Group  2nd Tuesday of the month 1pm-2pm, Journey Room  > Creative Journey  3rd Tuesday of the month 1130am-1pm, Journey Room  > Look Good Feel Better  1st Wednesday of the month 10am-12 noon, Journey Room (Call [place] to register [phone])

## 2016-11-01 IMAGING — CT CT L SPINE W/O CM
3 of 9 series · 12 of 33 positions shown, 14 images · non-contrast
Comparison: Lumbar spine CT 01/22/2014 and earlier.

CLINICAL DATA: 73-year-old with lumbar back pain radiating to the
left lower extremity for 6 months. Radicular pain. Subsequent
encounter. Current history of bilateral hip replacement.

EXAM:
CT LUMBAR SPINE WITHOUT CONTRAST
TECHNIQUE: Multidetector CT imaging of the lumbar spine was performed without
intravenous contrast administration. Multiplanar CT image
reconstructions were also generated.

[Series 3: lumbar spine 2.0 b30s · axial · 0.36mm/px · z∈[-265,-115]mm · 4 of 106 slices shown, 5 images]
[im 16/106  soft-tissue]
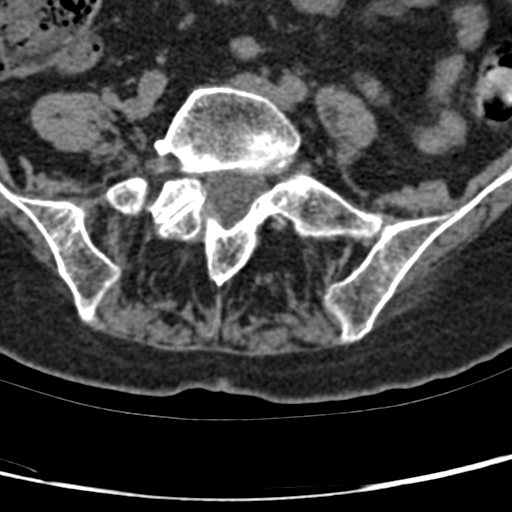
[im 16/106  bone]
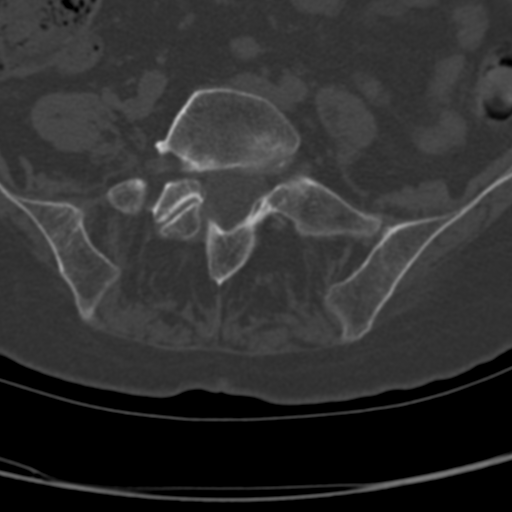
[im 46/106  bone]
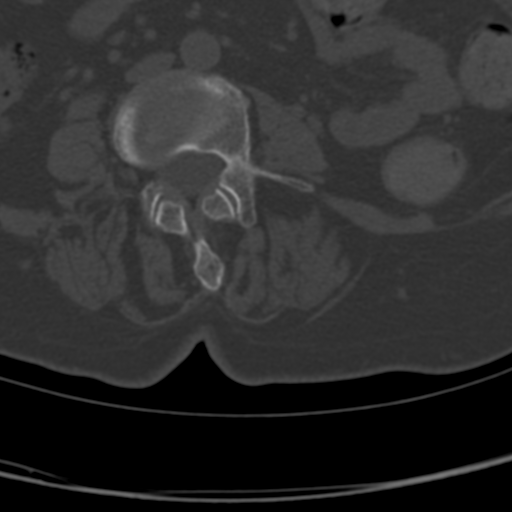
[im 61/106  bone]
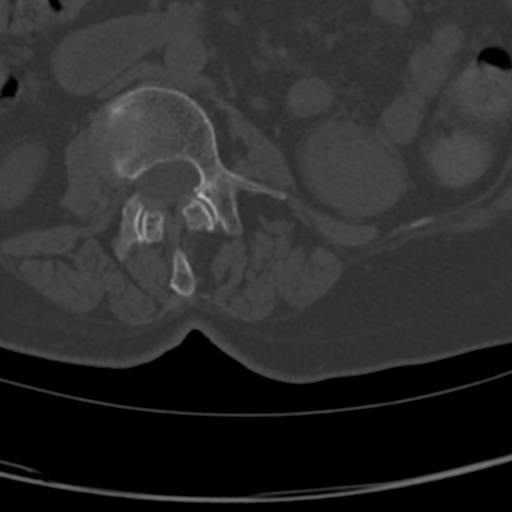
[im 91/106  bone]
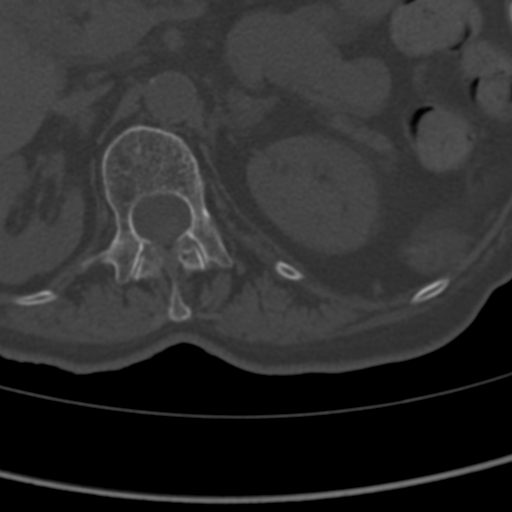

[Series 4: lumbar spine 2.0 spo · coronal · 0.32mm/px · 3 of 67 slices shown]
[im 14/67  bone]
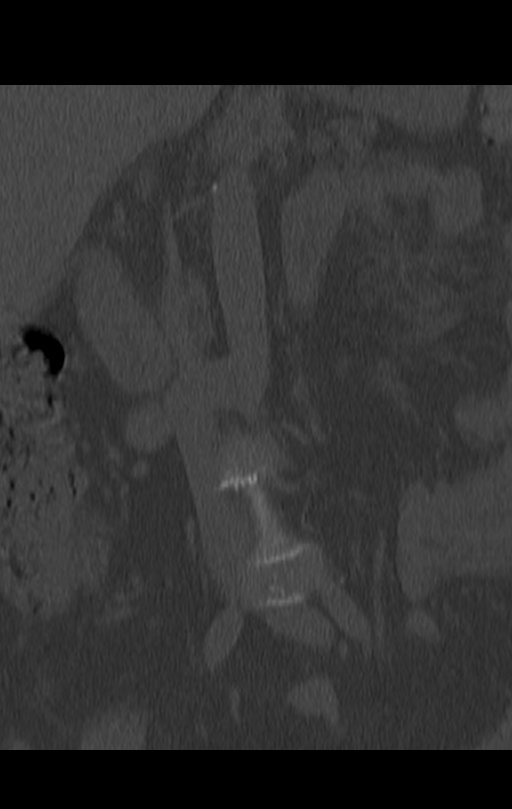
[im 27/67  bone]
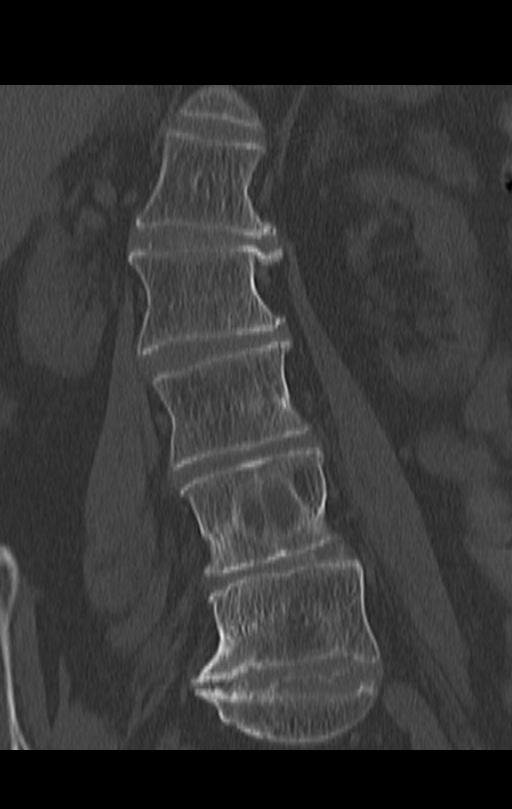
[im 40/67  bone]
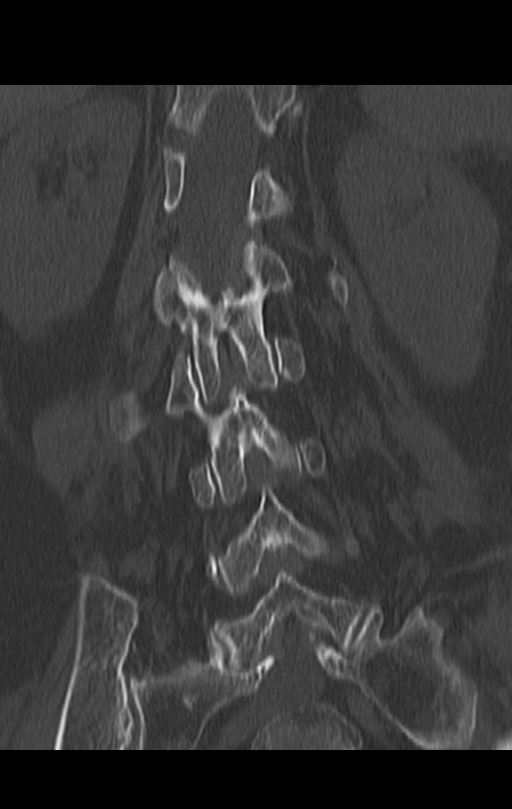

[Series 5: lumbar spine 2.0 spo sag · sagittal · 0.28mm/px · 5 of 75 slices shown, 6 images]
[im 25/75  bone]
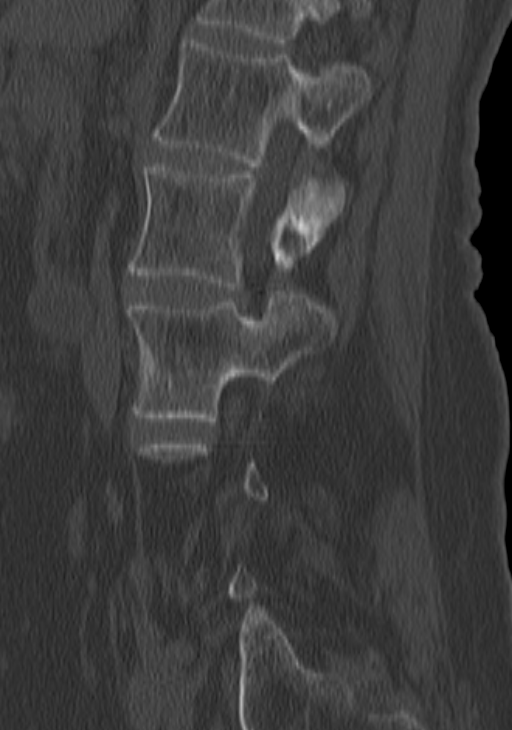
[im 31/75  bone]
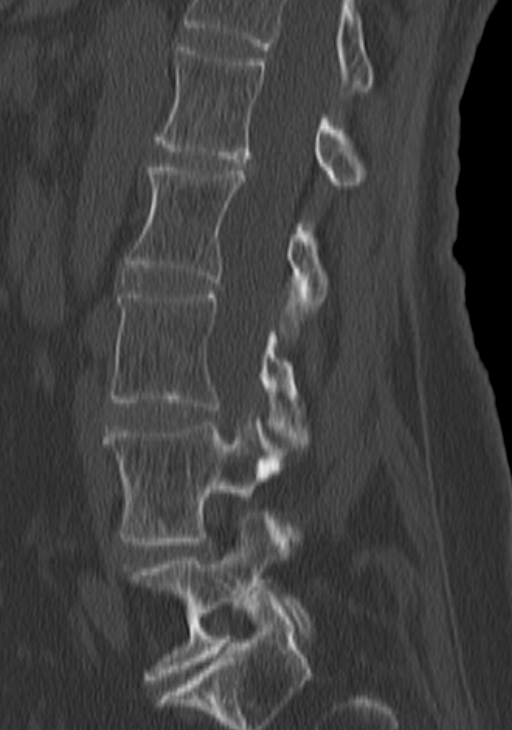
[im 38/75  soft-tissue]
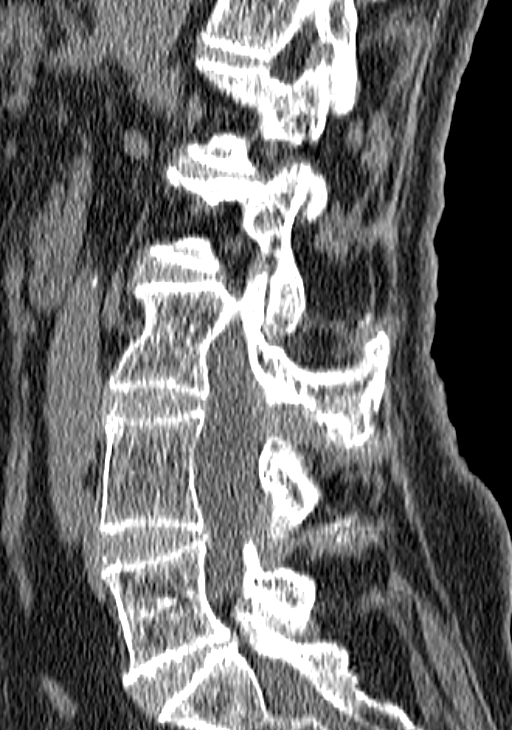
[im 38/75  bone]
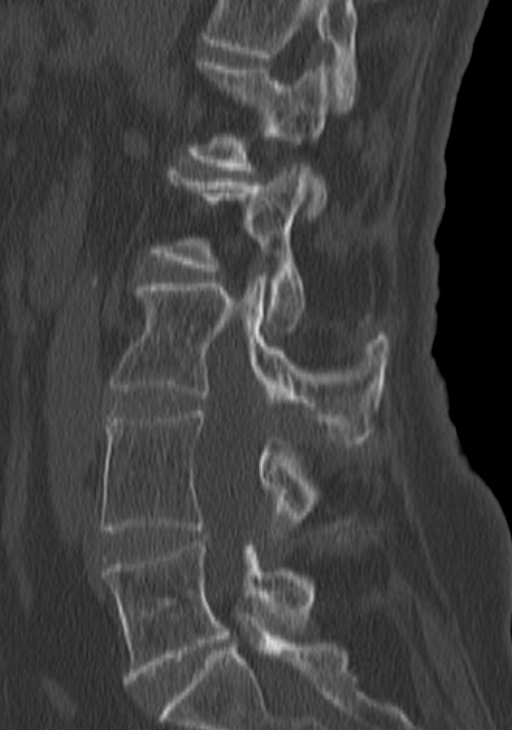
[im 44/75  bone]
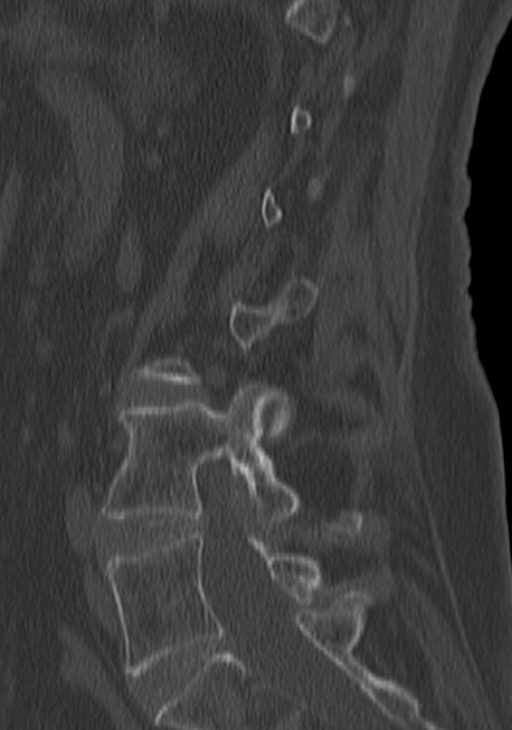
[im 50/75  bone]
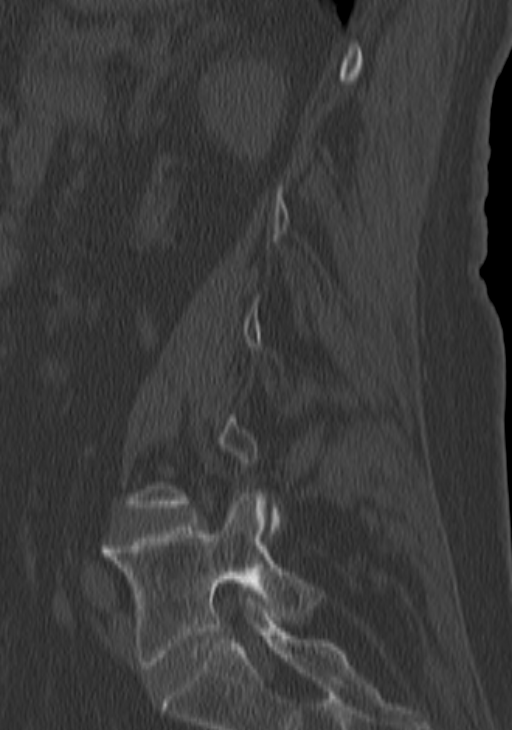

[12 of 33 positions shown; findings below may reference images not displayed]

FINDINGS: Same numbering system as in 8456. S-shaped thoracolumbar scoliosis.
Stable vertebral height and alignment. Stable bone mineralization.
No acute osseous abnormality identified. Visible sacrum and SI
joints are intact.

Mild Aortoiliac calcified atherosclerosis noted. Stable visualized
abdominal viscera. Posterior paraspinal muscle chronic atrophy in
the lower lumbar spine.

T12-L1:  Negative.

L1-L2: Chronic left eccentric partially calcified disc bulge and
disc osteophyte complex. No significant stenosis.

L2-L3:  Mild left eccentric disc osteophyte complex.  No stenosis.

L3-L4:  Mild disc bulge and facet hypertrophy.  No stenosis.

L4-L5: Partially calcified chronic circumferential disc bulge.
Chronic moderate facet and ligament flavum hypertrophy. No
significant stenosis.

L5-S1: Chronic right eccentric circumferential disc osteophyte
complex. Trace vacuum disc re- identified. Moderate facet
hypertrophy. Still, no spinal or lateral recess stenosis. No
significant foraminal stenosis.
IMPRESSION: Stable lumbar spine with sequelae of chronic thoracolumbar
scoliosis, including disc, endplate, and posterior element
degeneration. However, no lumbar spinal stenosis or convincing
neural impingement is identified.

## 2017-03-04 ENCOUNTER — Encounter (HOSPITAL_COMMUNITY): Payer: Self-pay | Admitting: Emergency Medicine

## 2017-03-04 ENCOUNTER — Emergency Department (HOSPITAL_COMMUNITY): Payer: Medicare Other

## 2017-03-04 ENCOUNTER — Observation Stay (HOSPITAL_COMMUNITY)
Admission: EM | Admit: 2017-03-04 | Discharge: 2017-03-05 | Disposition: A | Discharge status: Home / Self Care | Payer: Medicare Other | Attending: Family Medicine | Admitting: Family Medicine

## 2017-03-04 DIAGNOSIS — Z79899 Other long term (current) drug therapy: Secondary | ICD-10-CM | POA: Diagnosis not present

## 2017-03-04 DIAGNOSIS — E7800 Pure hypercholesterolemia, unspecified: Secondary | ICD-10-CM | POA: Diagnosis present

## 2017-03-04 DIAGNOSIS — Z87891 Personal history of nicotine dependence: Secondary | ICD-10-CM | POA: Insufficient documentation

## 2017-03-04 DIAGNOSIS — K219 Gastro-esophageal reflux disease without esophagitis: Secondary | ICD-10-CM | POA: Diagnosis not present

## 2017-03-04 DIAGNOSIS — Z853 Personal history of malignant neoplasm of breast: Secondary | ICD-10-CM | POA: Insufficient documentation

## 2017-03-04 DIAGNOSIS — R079 Chest pain, unspecified: Secondary | ICD-10-CM | POA: Diagnosis not present

## 2017-03-04 DIAGNOSIS — R0789 Other chest pain: Principal | ICD-10-CM | POA: Insufficient documentation

## 2017-03-04 LAB — TROPONIN I: Troponin I: <0.03 ng/mL (ref <0.03)

## 2017-03-04 LAB — CBC WITH DIFFERENTIAL/PLATELET
BASOS PCT: 1 %
Basophils Absolute: 0 10*3/uL (ref 0.0–0.1)
EOS ABS: 0.2 10*3/uL (ref 0.0–0.7)
Eosinophils Relative: 3 %
HCT: 39.6 % (ref 36.0–46.0)
Hemoglobin: 13.3 g/dL (ref 12.0–15.0)
LYMPHS PCT: 26 %
Lymphs Abs: 1.4 10*3/uL (ref 0.7–4.0)
MCH: 32.2 pg (ref 26.0–34.0)
MCHC: 33.6 g/dL (ref 30.0–36.0)
MCV: 95.9 fL (ref 78.0–100.0)
MONOS PCT: 11 %
Monocytes Absolute: 0.6 10*3/uL (ref 0.1–1.0)
NEUTROS PCT: 59 %
Neutro Abs: 3.3 10*3/uL (ref 1.7–7.7)
PLATELETS: 266 10*3/uL (ref 150–400)
RBC: 4.13 MIL/uL (ref 3.87–5.11)
RDW: 12.5 % (ref 11.5–15.5)
WBC: 5.5 10*3/uL (ref 4.0–10.5)

## 2017-03-04 LAB — COMPREHENSIVE METABOLIC PANEL
ALBUMIN: 4.1 g/dL (ref 3.5–5.0)
ALT: 15 U/L (ref 14–54)
AST: 19 U/L (ref 15–41)
Alkaline Phosphatase: 38 U/L (ref 38–126)
Anion gap: 11 (ref 5–15)
BUN: 13 mg/dL (ref 6–20)
CHLORIDE: 101 mmol/L (ref 101–111)
CO2: 28 mmol/L (ref 22–32)
CREATININE: 0.53 mg/dL (ref 0.44–1.00)
Calcium: 9.4 mg/dL (ref 8.9–10.3)
GFR calc Af Amer: >60 mL/min (ref >60)
GFR calc non Af Amer: >60 mL/min (ref >60)
GLUCOSE: 122 mg/dL — AB (ref 65–99)
Potassium: 4.2 mmol/L (ref 3.5–5.1)
SODIUM: 140 mmol/L (ref 135–145)
Total Bilirubin: 0.5 mg/dL (ref 0.3–1.2)
Total Protein: 7.1 g/dL (ref 6.5–8.1)

## 2017-03-04 LAB — LIPASE, BLOOD: Lipase: 26 U/L (ref 11–51)

## 2017-03-04 MED ORDER — PANTOPRAZOLE SODIUM 40 MG PO TBEC
40.0000 mg | DELAYED_RELEASE_TABLET | Freq: Every day | ORAL | Status: DC
  Administered 2017-03-04: 40 mg via ORAL
  Filled 2017-03-04: qty 1

## 2017-03-04 MED ORDER — NITROGLYCERIN 0.4 MG SL SUBL
0.4000 mg | SUBLINGUAL_TABLET | SUBLINGUAL | Status: DC | PRN
Start: 2017-03-04 — End: 2017-03-05
  Administered 2017-03-04 (×2): 0.4 mg via SUBLINGUAL
  Filled 2017-03-04: qty 1

## 2017-03-04 MED ORDER — ACETAMINOPHEN 325 MG PO TABS
650.0000 mg | ORAL_TABLET | ORAL | Status: DC | PRN
  Administered 2017-03-05: 650 mg via ORAL
  Filled 2017-03-04: qty 2

## 2017-03-04 MED ORDER — ASPIRIN EC 325 MG PO TBEC: 325.0000 mg | DELAYED_RELEASE_TABLET | Freq: Every day | ORAL | Status: DC

## 2017-03-04 MED ORDER — MORPHINE SULFATE (PF) 2 MG/ML IV SOLN
2.0000 mg | INTRAVENOUS | Status: DC | PRN
  Administered 2017-03-04: 2 mg via INTRAVENOUS
  Filled 2017-03-04: qty 1

## 2017-03-04 MED ORDER — GI COCKTAIL ~~LOC~~
30.0000 mL | Freq: Once | ORAL | Status: AC
  Administered 2017-03-04: 30 mL via ORAL

## 2017-03-04 MED ORDER — PRAVASTATIN SODIUM 40 MG PO TABS
40.0000 mg | ORAL_TABLET | Freq: Every day | ORAL | Status: DC
  Administered 2017-03-04: 40 mg via ORAL
  Filled 2017-03-04: qty 1

## 2017-03-04 MED ORDER — GI COCKTAIL ~~LOC~~
ORAL | Status: AC
  Filled 2017-03-04: qty 30

## 2017-03-04 MED ORDER — LORATADINE 10 MG PO TABS
10.0000 mg | ORAL_TABLET | Freq: Every day | ORAL | Status: DC
  Administered 2017-03-04: 10 mg via ORAL
  Filled 2017-03-04: qty 1

## 2017-03-04 MED ORDER — FLUTICASONE PROPIONATE 50 MCG/ACT NA SUSP: 1.0000 | Freq: Every day | NASAL | Status: DC | PRN

## 2017-03-04 MED ORDER — ASPIRIN 81 MG PO CHEW
324.0000 mg | CHEWABLE_TABLET | Freq: Once | ORAL | Status: AC
  Administered 2017-03-04: 324 mg via ORAL
  Filled 2017-03-04: qty 4

## 2017-03-04 MED ORDER — HYDROCODONE-ACETAMINOPHEN 5-325 MG PO TABS
0.5000 | ORAL_TABLET | Freq: Every day | ORAL | Status: DC | PRN
  Administered 2017-03-04: 1 via ORAL
  Filled 2017-03-04: qty 1

## 2017-03-04 MED ORDER — ENOXAPARIN SODIUM 40 MG/0.4ML ~~LOC~~ SOLN
40.0000 mg | SUBCUTANEOUS | Status: DC
  Administered 2017-03-04: 40 mg via SUBCUTANEOUS
  Filled 2017-03-04: qty 0.4

## 2017-03-04 MED ORDER — ESCITALOPRAM OXALATE 10 MG PO TABS
20.0000 mg | ORAL_TABLET | Freq: Every day | ORAL | Status: DC
Start: 2017-03-05 — End: 2017-03-05

## 2017-03-04 MED ORDER — ONDANSETRON HCL 4 MG/2ML IJ SOLN: 4.0000 mg | Freq: Four times a day (QID) | INTRAMUSCULAR | Status: DC | PRN

## 2017-03-04 NOTE — ED Notes (Signed)
SBP less than 100, unable to give another SL NTG

## 2017-03-04 NOTE — ED Notes (Signed)
Lab call for draw in [place]

## 2017-03-04 NOTE — H&P (Signed)
History and Physical  Roberta Gonzalez MWU:[id number] DOB: February 13, 1941 DOA: 03/04/2017  Referring physician: Dr Thurnell Garbe, ED physician PCP: Maricela Curet, MD  Outpatient Specialists: none  Patient Coming From: home  Chief Complaint: Chest pain  HPI: Roberta Gonzalez is a 76 y.o. female with a history of breast cancer status post mastectomy, GERD, hyperlipidemia, neuropathy, osteoporosis, depression. Patient seen for chest pain over the past 1-2 weeks with radiation into her right shoulder today no nausea, vomiting, diaphoresis. Pain is intermittent and last for about an hour. No palliating or provoking factors  Emergency Department Course: Troponin negative. Laboratory data relatively normal.  Review of Systems:   Pt denies any fevers, chills, nausea, vomiting, diarrhea, constipation, abdominal pain, shortness of breath, dyspnea on exertion, orthopnea, cough, wheezing, palpitations, headache, vision changes, lightheadedness, dizziness, melena, rectal bleeding.  Review of systems are otherwise negative  Past Medical History:  Diagnosis Date  . Arthritis   . Breast cancer ([place])    right in 2000 in [place] and underwent a mastectomy.  . Cancer ([place])    jan 2012/mastectomy L; chemo/rad tx  . Chest pain 06/10/2011  . Depression   . GERD (gastroesophageal reflux disease) 06/10/2011  . Hypercholesteremia   . Invasive ductal carcinoma of breast ([place]) 06/10/2011  . Neuropathy ([place])   . Osteoporosis   . Port catheter in place 11/29/2012   Past Surgical History:  Procedure Laterality Date  . ABDOMINAL HYSTERECTOMY    . CATARACT EXTRACTION W/PHACO Right 04/02/2013   Procedure: CATARACT EXTRACTION PHACO AND INTRAOCULAR LENS PLACEMENT (IOC);  Surgeon: [name], MD;  Location: [facility] ORS;  Service: Ophthalmology;  Laterality: Right;  CDE 22.44  . CATARACT EXTRACTION W/PHACO Left 04/26/2013   Procedure: CATARACT EXTRACTION PHACO AND INTRAOCULAR LENS PLACEMENT (IOC);  Surgeon: [name], MD;   Location: [facility] ORS;  Service: Ophthalmology;  Laterality: Left;  CDE: 23.60  . COLONOSCOPY N/A 04/05/2014   Procedure: COLONOSCOPY;  Surgeon: [name], MD;  Location: [facility] END[facility] SU[facility];  Service: Endoscopy;  Laterality: N/A;  11:15  . cranial shunt    . ESOPHAGOGASTRODUODENOSCOPY (EGD) WITH ESOPHAGEAL DILATION N/A 05/03/2013   Procedure: ESOPHAGOGASTRODUODENOSCOPY (EGD) WITH ESOPHAGEAL DILATION;  Surgeon: [name], MD;  Location: [facility];  Service: Endoscopy;  Laterality: N/A;  145-moved to 100 [place] to notify pt  . hip replacements Bilateral   . JOINT REPLACEMENT    . MASTECTOMY Bilateral   . PORT-A-CATH REMOVAL Right 03/22/2016   Procedure: MINOR REMOVAL PORT-A-CATH RIGHT SUBCLAVIAN;  Surgeon: Aviva Signs, MD;  Location: [facility] ORS;  Service: General;  Laterality: Right;  start at 0841; stop at 0858  . TONSILLECTOMY     Social History:  reports that she has quit smoking. She has never used smokeless tobacco. She reports that she drinks alcohol. She reports that she does not use drugs. Patient lives at [place]  . Codeine Other (See Comments)    Makes pt nervous  . Percodan [Oxycodone-Aspirin] Other (See Comments)    Mood change (climbing the walls, angry, irritable)  . Sulfa Antibiotics Nausea Only    Family History  Problem Relation Age of Onset  . Kidney disease Neg Hx   . Bladder Cancer Neg Hx       Prior to Admission medications   Medication Sig Start Date End Date Taking? Authorizing Provider  alendronate (FOSAMAX) 70 MG tablet Take 70 mg by mouth every 7 (seven) days. Take with a full glass of water on an empty  stomach.(sunday)   Yes Historical Provider, MD  Cholecalciferol 1000 UNITS tablet Take 2,000 Units by mouth every morning.    Yes Historical Provider, MD  diphenhydrAMINE (BENADRYL) 25 mg capsule Take 25 mg by mouth every 4 (four) hours as needed for allergies.   Yes Historical Provider, MD  escitalopram (LEXAPRO) 20 MG tablet TAKE  ONE TABLET BY MOUTH ONCE DAILY 08/23/16  Yes Baird Cancer, PA-C  esomeprazole (NEXIUM) 20 MG capsule Take 20 mg by mouth daily.    Yes Historical Provider, MD  fexofenadine (ALLEGRA) 180 MG tablet Take 180 mg by mouth every morning.    Yes Historical Provider, MD  fish oil-omega-3 fatty acids 1000 MG capsule Take 1 g by mouth 2 (two) times daily.    Yes Historical Provider, MD  fluticasone (FLONASE) 50 MCG/ACT nasal spray Place 1 spray into both nostrils daily as needed for allergies.    Yes Historical Provider, MD  HYDROcodone-acetaminophen (NORCO/VICODIN) 5-325 MG per tablet Take 0.5-1 tablets by mouth daily as needed for severe pain.    Yes Historical Provider, MD  pravastatin (PRAVACHOL) 40 MG tablet Take 40 mg by mouth at bedtime.    Yes Historical Provider, MD  Prenatal Vit-Fe Psac Cmplx-FA (PRENATAL MULTIVITAMIN) 60-1 MG tablet Take 1 tablet by mouth daily with breakfast.    Yes Historical Provider, MD  Alum & Mag Hydroxide-Simeth (GI COCKTAIL) SUSP suspension Take 30 mLs by mouth daily as needed (stomach). Shake well.    Historical Provider, MD    Physical Exam: BP (!) 102/55   Pulse 62   Temp 98.9 F (37.2 C) (Tympanic)   Resp 11   Ht 5\' 3"  (1.6 m)   Wt 49 kg (108 lb)   SpO2 97%   BMI 19.13 kg/m   General: Elderly Caucasian female. Awake and alert and oriented x3. No acute cardiopulmonary distress.  HEENT: Normocephalic atraumatic.  Right and left ears normal in appearance.  Pupils equal, round, reactive to light. Extraocular muscles are intact. Sclerae anicteric and noninjected.  Moist mucosal membranes. No mucosal lesions.  Neck: Neck supple without lymphadenopathy. No carotid bruits. No masses palpated.  Cardiovascular: Regular rate with normal S1-S2 sounds. No murmurs, rubs, gallops auscultated. No JVD.  Respiratory: Good respiratory effort with no wheezes, rales, rhonchi. Lungs clear to auscultation bilaterally.  No accessory muscle use. Abdomen: Soft, nontender,  nondistended. Active bowel sounds. No masses or hepatosplenomegaly  Skin: No rashes, lesions, or ulcerations.  Dry, warm to touch. 2+ dorsalis pedis and radial pulses. Musculoskeletal: No calf or leg pain. All major joints not erythematous nontender.  No upper or lower joint deformation.  Good ROM.  No contractures  Psychiatric: Intact judgment and insight. Pleasant and cooperative. Neurologic: No focal neurological deficits. Strength is 5/5 and symmetric in upper and lower extremities.  Cranial nerves II through XII are grossly intact.           Labs on Admission: I have personally reviewed following labs and imaging studies  CBC:  Recent Labs Lab 03/04/17 1548  WBC 5.5  NEUTROABS 3.3  HGB 13.3  HCT 39.6  MCV 95.9  PLT 250   Basic Metabolic Panel:  Recent Labs Lab 03/04/17 1549  NA 140  K 4.2  CL 101  CO2 28  GLUCOSE 122*  BUN 13  CREATININE 0.53  CALCIUM 9.4   GFR: Estimated Creatinine Clearance: 47.7 mL/min (by C-G formula based on SCr of 0.53 mg/dL). Liver Function Tests:  Recent Labs Lab 03/04/17 1549  AST 19  ALT 15  ALKPHOS 38  BILITOT 0.5  PROT 7.1  ALBUMIN 4.1    Recent Labs Lab 03/04/17 1549  LIPASE 26   No results for input(s): AMMONIA in the last 168 hours. Coagulation Profile: No results for input(s): INR, PROTIME in the last 168 hours. Cardiac Enzymes:  Recent Labs Lab 03/04/17 1548 03/04/17 1818  TROPONINI <0.03 <0.03   BNP (last 3 results) No results for input(s): PROBNP in the last 8760 hours. HbA1C: No results for input(s): HGBA1C in the last 72 hours. CBG: No results for input(s): GLUCAP in the last 168 hours. Lipid Profile: No results for input(s): CHOL, HDL, LDLCALC, TRIG, CHOLHDL, LDLDIRECT in the last 72 hours. Thyroid Function Tests: No results for input(s): TSH, T4TOTAL, FREET4, T3FREE, THYROIDAB in the last 72 hours. Anemia Panel: No results for input(s): VITAMINB12, FOLATE, FERRITIN, TIBC, IRON, RETICCTPCT in  the last 72 hours. Urine analysis:    Component Value Date/Time   COLORURINE YELLOW 05/28/2016 1458   APPEARANCEUR Clear 06/21/2016 1430   LABSPEC 1.025 05/28/2016 1458   PHURINE 6.0 05/28/2016 1458   GLUCOSEU Negative 06/21/2016 1430   HGBUR TRACE (A) 05/28/2016 1458   BILIRUBINUR Negative 06/21/2016 1430   KETONESUR NEGATIVE 05/28/2016 1458   PROTEINUR Negative 06/21/2016 1430   PROTEINUR NEGATIVE 05/28/2016 1458   NITRITE Negative 06/21/2016 1430   NITRITE NEGATIVE 05/28/2016 1458   LEUKOCYTESUR Negative 06/21/2016 1430   Sepsis Labs: @LABRCNTIP (procalcitonin:4,lacticidven:4) )No results found for this or any previous visit (from the past 240 hour(s)).   Radiological Exams on Admission: Dg Chest 2 View  Result Date: 03/04/2017 CLINICAL DATA:  Chest pain, epigastric pain for 1 week EXAM: CHEST  2 VIEW COMPARISON:  04/20/2011 FINDINGS: Borderline cardiomegaly. Mild hyperinflation. Probable chronic mild interstitial prominence stable. No infiltrate or pulmonary edema. Again noted probable old VP shunt tubing right chest. Surgical clips in left axilla again noted. IMPRESSION: No active cardiopulmonary disease. Mild hyperinflation. Stable probable chronic mild interstitial prominence. Electronically Signed   By: Lahoma Crocker M.D.   On: 03/04/2017 17:07    EKG: Independently reviewed. Sinus rhythm  Assessment/Plan: Principal Problem:   Chest pain Active Problems:   GERD (gastroesophageal reflux disease)   Hypercholesterolemia    This patient was discussed with the ED physician, including pertinent vitals, physical exam findings, labs, and imaging.  We also discussed care given by the ED provider.  #1 chest pain  Observation on telemetry  Serial troponins  Daily aspirin  Antilipid therapy #2 GERD  Continue PPI #3 hypercholesterolemia  Continue antilipid therapy  DVT prophylaxis: Lovenox Consultants: None Code Status: Full code Family Communication: None    Disposition Plan: Patient should be able to return home following admission   Truett Mainland, DO [facility] Pager [phone]  If 7PM-7AM, please contact night-coverage [email or web address] Password [email or web address]

## 2017-03-04 NOTE — ED Provider Notes (Signed)
[facility] DEPT Provider Note   CSN: [id number] Arrival date & time: 03/04/17  1447     History   Chief Complaint Chief Complaint  Patient presents with  . Abdominal Pain  . Chest Pain    HPI Roberta Gonzalez is a 76 y.o. female.     Pt was seen at 1520. Per pt, c/o gradual onset and persistence of multiple intermittent episodes of chest "pain" for the past 1 week. Describes the CP as "heaviness" and "pressure," located in her mid-chest. States her symptoms will last for at least 1 hour each episode before resolving.  Pt states her symptoms were "worse" today. Pt states her symptoms "don't feel like my usual reflux." Pt states she took MOM once with "some" improvement. Denies palpitations, no SOB/cough, no abd pain, no N/V/D, no back pain.   Past Medical History:  Diagnosis Date  . Arthritis   . Breast cancer ([place])    right in 2000 in [place] and underwent a mastectomy.  . Cancer ([place])    jan 2012/mastectomy L; chemo/rad tx  . Chest pain 06/10/2011  . Depression   . GERD (gastroesophageal reflux disease) 06/10/2011  . Hypercholesteremia   . 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 Neuropathy ([place])   . Osteoporosis   . Port catheter in place 11/29/2012    Patient Active Problem List   Diagnosis Date Noted  . Personal history of colonic polyps 03/13/2014  . Family hx of colon cancer 03/13/2014  . Port catheter in place 11/29/2012  . 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 GERD (gastroesophageal reflux disease) 06/10/2011  . Chest pain 06/10/2011    Past Surgical History:  Procedure Laterality Date  . ABDOMINAL HYSTERECTOMY    . CATARACT EXTRACTION W/PHACO Right 04/02/2013   Procedure: CATARACT EXTRACTION PHACO AND INTRAOCULAR LENS PLACEMENT (IOC);  Surgeon: [name], MD;  Location: [facility];  Service: Ophthalmology;  Laterality: Right;  CDE 22.44  . CATARACT EXTRACTION W/PHACO Left 04/26/2013   Procedure: CATARACT EXTRACTION PHACO AND  INTRAOCULAR LENS PLACEMENT (IOC);  Surgeon: [name], MD;  Location: [facility];  Service: Ophthalmology;  Laterality: Left;  CDE: 23.60  . COLONOSCOPY N/A 04/05/2014   Procedure: COLONOSCOPY;  Surgeon: [name], MD;  Location: [facility];  Service: Endoscopy;  Laterality: N/A;  11:15  . cranial shunt    . ESOPHAGOGASTRODUODENOSCOPY (EGD) WITH ESOPHAGEAL DILATION N/A 05/03/2013   Procedure: ESOPHAGOGASTRODUODENOSCOPY (EGD) WITH ESOPHAGEAL DILATION;  Surgeon: [name], MD;  Location: [facility];  Service: Endoscopy;  Laterality: N/A;  145-moved to 100 Ann to notify pt  . hip replacements Bilateral   . JOINT REPLACEMENT    . MASTECTOMY Bilateral   . PORT-A-CATH REMOVAL Right 03/22/2016   Procedure: MINOR REMOVAL PORT-A-CATH RIGHT SUBCLAVIAN;  Surgeon: Aviva Signs, MD;  Location: [facility];  Service: General;  Laterality: Right;  start at 0841; stop at 0858  . TONSILLECTOMY      OB History    No data available       Home Medications    Prior to Admission medications   Medication Sig Start Date End Date Taking? Authorizing Provider  alendronate (FOSAMAX) 70 MG tablet Take 70 mg by mouth every 7 (seven) days. Take with a full glass of water on an empty stomach.(sunday)    Historical Provider, MD  Alum & Mag Hydroxide-Simeth (GI COCKTAIL) SUSP suspension Take 30 mLs by mouth daily as needed (stomach). Shake well.    Historical  Provider, MD  Cholecalciferol 1000 UNITS tablet Take 2,000 Units by mouth every morning.     Historical Provider, MD  diphenhydrAMINE (BENADRYL) 25 mg capsule Take 25 mg by mouth every 4 (four) hours as needed for allergies.    Historical Provider, MD  escitalopram (LEXAPRO) 20 MG tablet TAKE ONE TABLET BY MOUTH ONCE DAILY 08/23/16   Baird Cancer, PA-C  esomeprazole (NEXIUM) 20 MG capsule Take 20 mg by mouth daily.     Historical Provider, MD  fexofenadine (ALLEGRA) 180 MG tablet Take 180 mg by mouth every morning.     Historical Provider, MD  fish  oil-omega-3 fatty acids 1000 MG capsule Take 1 g by mouth 2 (two) times daily.     Historical Provider, MD  fluticasone (FLONASE) 50 MCG/ACT nasal spray Place 1 spray into both nostrils daily.    Historical Provider, MD  HYDROcodone-acetaminophen (NORCO/VICODIN) 5-325 MG per tablet Take 0.5-1 tablets by mouth daily as needed for severe pain.     Historical Provider, MD  ketorolac (TORADOL) 10 MG tablet Take 1 tablet (10 mg total) by mouth every 6 (six) hours as needed. Patient not taking: Reported on 06/21/2016 05/28/16   Nat Christen, MD  OVER THE COUNTER MEDICATION Place 1 drop into the left eye 2 (two) times daily. OTC Allergy eye drop    Historical Provider, MD  pravastatin (PRAVACHOL) 40 MG tablet Take 40 mg by mouth at bedtime.     Historical Provider, MD  Prenatal Vit-Fe Psac Cmplx-FA (PRENATAL MULTIVITAMIN) 60-1 MG tablet Take 1 tablet by mouth daily with breakfast.     Historical Provider, MD  sucralfate (CARAFATE) 1 GM/10ML suspension Take 10 mLs (1 g total) by mouth daily as needed. Patient not taking: Reported on 05/28/2016 03/10/15   Baird Cancer, PA-C    Family History Family History  Problem Relation Age of Onset  . Kidney disease Neg Hx   . Bladder Cancer Neg Hx     Social History Social History  Substance Use Topics  . Smoking status: Former Research scientist (life sciences)  . Smokeless tobacco: Never Used     Comment: quit smoking over 20 yrs ago  . Alcohol use Yes     Comment: glass of liquor about twice a week     Allergies   Codeine; Percodan [oxycodone-aspirin]; and Sulfa antibiotics   Review of Systems Review of Systems ROS: Statement: All systems negative except as marked or noted in the HPI; Constitutional: Negative for fever and chills. ; ; Eyes: Negative for eye pain, redness and discharge. ; ; ENMT: Negative for ear pain, hoarseness, nasal congestion, sinus pressure and sore throat. ; ; Cardiovascular: +CP. Negative for palpitations, diaphoresis, dyspnea and peripheral edema. ; ;  Respiratory: Negative for cough, wheezing and stridor. ; ; Gastrointestinal: Negative for nausea, vomiting, diarrhea, abdominal pain, blood in stool, hematemesis, jaundice and rectal bleeding. . ; ; Genitourinary: Negative for dysuria, flank pain and hematuria. ; ; Musculoskeletal: Negative for back pain and neck pain. Negative for swelling and trauma.; ; Skin: Negative for pruritus, rash, abrasions, blisters, bruising and skin lesion.; ; Neuro: Negative for headache, lightheadedness and neck stiffness. Negative for weakness, altered level of consciousness, altered mental status, extremity weakness, paresthesias, involuntary movement, seizure and syncope.       Physical Exam Updated Vital Signs BP (!) 116/48 (BP Location: Right Arm)   Pulse 88   Temp 98.9 F (37.2 C) (Tympanic)   Resp 18   Ht 5\' 3"  (1.6 m)   Wt  108 lb (49 kg)   SpO2 96%   BMI 19.13 kg/m   Physical Exam 1525: Physical examination:  Nursing notes reviewed; Vital signs and O2 SAT reviewed;  Constitutional: Well developed, Well nourished, Well hydrated, In no acute distress; Head:  Normocephalic, atraumatic; Eyes: EOMI, PERRL, No scleral icterus; ENMT: Mouth and pharynx normal, Mucous membranes moist; Neck: Supple, Full range of motion, No lymphadenopathy; Cardiovascular: Regular rate and rhythm, No gallop; Respiratory: Breath sounds clear & equal bilaterally, No wheezes.  Speaking full sentences with ease, Normal respiratory effort/excursion; Chest: Nontender, Movement normal; Abdomen: Soft, Nontender, Nondistended, Normal bowel sounds; Genitourinary: No CVA tenderness; Extremities: Pulses normal, No tenderness, No edema, No calf edema or asymmetry.; Neuro: AA&Ox3, Major CN grossly intact.  Speech clear. No gross focal motor or sensory deficits in extremities.; Skin: Color normal, Warm, Dry.   ED Treatments / Results  Labs (all labs ordered are listed, but only abnormal results are displayed)   EKG  EKG  Interpretation  Date/Time:  Friday March 04 2017 15:03:39 EDT Ventricular Rate:  94 PR Interval:  146 QRS Duration: 80 QT Interval:  350 QTC Calculation: 437 R Axis:   67 Text Interpretation:  Normal sinus rhythm When compared with ECG of 03/26/2013 No significant change was found Confirmed by [facility]  MD, Nunzio Cory [phone]) on 03/04/2017 4:18:16 PM       Radiology   Procedures Procedures (including critical care time)  Medications Ordered in ED Medications  nitroGLYCERIN (NITROSTAT) SL tablet 0.4 mg (0.4 mg Sublingual Given 03/04/17 1547)  morphine 2 MG/ML injection 2 mg (2 mg Intravenous Given 03/04/17 1546)  aspirin chewable tablet 324 mg (324 mg Oral Given 03/04/17 1546)     Initial Impression / Assessment and Plan / ED Course  I have reviewed the triage vital signs and the nursing notes.  Pertinent labs & imaging results that were available during my care of the patient were reviewed by me and considered in my medical decision making (see chart for details).  MDM Reviewed: previous chart, nursing note and vitals Reviewed previous: labs and ECG Interpretation: labs, ECG and x-ray   Results for orders placed or performed during the hospital encounter of 03/04/17  CBC with Differential  Result Value Ref Range   WBC 5.5 4.0 - 10.5 K/uL   RBC 4.13 3.87 - 5.11 MIL/uL   Hemoglobin 13.3 12.0 - 15.0 g/dL   HCT 39.6 36.0 - 46.0 %   MCV 95.9 78.0 - 100.0 fL   MCH 32.2 26.0 - 34.0 pg   MCHC 33.6 30.0 - 36.0 g/dL   RDW 12.5 11.5 - 15.5 %   Platelets 266 150 - 400 K/uL   Neutrophils Relative % 59 %   Neutro Abs 3.3 1.7 - 7.7 K/uL   Lymphocytes Relative 26 %   Lymphs Abs 1.4 0.7 - 4.0 K/uL   Monocytes Relative 11 %   Monocytes Absolute 0.6 0.1 - 1.0 K/uL   Eosinophils Relative 3 %   Eosinophils Absolute 0.2 0.0 - 0.7 K/uL   Basophils Relative 1 %   Basophils Absolute 0.0 0.0 - 0.1 K/uL  Troponin I  Result Value Ref Range   Troponin I <0.03 <0.03 ng/mL  Lipase, blood   Result Value Ref Range   Lipase 26 11 - 51 U/L  Comprehensive metabolic panel  Result Value Ref Range   Sodium 140 135 - 145 mmol/L   Potassium 4.2 3.5 - 5.1 mmol/L   Chloride 101 101 - 111 mmol/L  CO2 28 22 - 32 mmol/L   Glucose, Bld 122 (H) 65 - 99 mg/dL   BUN 13 6 - 20 mg/dL   Creatinine, Ser 0.53 0.44 - 1.00 mg/dL   Calcium 9.4 8.9 - 10.3 mg/dL   Total Protein 7.1 6.5 - 8.1 g/dL   Albumin 4.1 3.5 - 5.0 g/dL   AST 19 15 - 41 U/L   ALT 15 14 - 54 U/L   Alkaline Phosphatase 38 38 - 126 U/L   Total Bilirubin 0.5 0.3 - 1.2 mg/dL   GFR calc non Af Amer >60 >60 mL/min   GFR calc Af Amer >60 >60 mL/min   Anion gap 11 5 - 15   Dg Chest 2 View Result Date: 03/04/2017 CLINICAL DATA:  Chest pain, epigastric pain for 1 week EXAM: CHEST  2 VIEW COMPARISON:  04/20/2011 FINDINGS: Borderline cardiomegaly. Mild hyperinflation. Probable chronic mild interstitial prominence stable. No infiltrate or pulmonary edema. Again noted probable old VP shunt tubing right chest. Surgical clips in left axilla again noted. IMPRESSION: No active cardiopulmonary disease. Mild hyperinflation. Stable probable chronic mild interstitial prominence. Electronically Signed   By: Lahoma Crocker M.D.   On: 03/04/2017 17:07     1830:  Workup reassuring; will check 2nd troponin. CP improving after ASA and SL ntg. Dx and testing d/w pt and family.  Questions answered.  Verb understanding, agreeable to admit. T/C to [facility]. Nehemiah Settle, case discussed, including:  HPI, pertinent PM/SHx, VS/PE, dx testing, ED course and treatment:  Agreeable to admit, requests to write temporary orders, obtain observation tele bed to Dr. Den[facility] service.    Final Clinical Impressions(s) / ED Diagnoses   Final diagnoses:  None    New Prescriptions New Prescriptions   No medications on file      Francine Graven, DO 03/09/17 1023

## 2017-03-04 NOTE — ED Triage Notes (Signed)
Pt c/o epigastric burning and heaviness x 1 week. Took MOM once and helped. nad. Denies n/v/d. Mm moist. A/o.

## 2017-03-05 DIAGNOSIS — R0789 Other chest pain: Secondary | ICD-10-CM | POA: Diagnosis not present

## 2017-03-05 LAB — TROPONIN I: Troponin I: <0.03 ng/mL (ref <0.03)

## 2017-03-05 MED ORDER — SUCRALFATE 1 GM/10ML PO SUSP: 1.0000 g | Freq: Three times a day (TID) | ORAL | 0 refills | Status: DC

## 2017-03-05 NOTE — Discharge Summary (Signed)
Physician Discharge Summary  Patient ID: Roberta Gonzalez MRN: [id number] DOB/AGE: 07/12/1941 76 y.o. Primary Care Physician:DONDIEGO,RICHARD M, MD Admit date: 03/04/2017 Discharge date: 03/05/2017    Discharge Diagnoses:  1. Gastroesophageal reflux disease. 2. Chest pain, likely related to #1. Serial cardiac enzymes are negative.   Allergies as of 03/05/2017      Reactions   Codeine Other (See Comments)   Makes pt nervous   Percodan [oxycodone-aspirin] Other (See Comments)   Mood change (climbing the walls, angry, irritable)   Sulfa Antibiotics Nausea Only      Medication List    STOP taking these medications   gi cocktail Susp suspension     TAKE these medications   alendronate 70 MG tablet Commonly known as:  FOSAMAX Take 70 mg by mouth every 7 (seven) days. Take with a full glass of water on an empty stomach.(sunday)   Cholecalciferol 1000 units tablet Take 2,000 Units by mouth every morning.   diphenhydrAMINE 25 mg capsule Commonly known as:  BENADRYL Take 25 mg by mouth every 4 (four) hours as needed for allergies.   escitalopram 20 MG tablet Commonly known as:  LEXAPRO TAKE ONE TABLET BY MOUTH ONCE DAILY   esomeprazole 20 MG capsule Commonly known as:  NEXIUM Take 20 mg by mouth daily.   fexofenadine 180 MG tablet Commonly known as:  ALLEGRA Take 180 mg by mouth every morning.   fish oil-omega-3 fatty acids 1000 MG capsule Take 1 g by mouth 2 (two) times daily.   fluticasone 50 MCG/ACT nasal spray Commonly known as:  FLONASE Place 1 spray into both nostrils daily as needed for allergies.   HYDROcodone-acetaminophen 5-325 MG tablet Commonly known as:  NORCO/VICODIN Take 0.5-1 tablets by mouth daily as needed for severe pain.   pravastatin 40 MG tablet Commonly known as:  PRAVACHOL Take 40 mg by mouth at bedtime.   prenatal multivitamin 60-1 MG tablet Take 1 tablet by mouth daily with breakfast.   sucralfate 1 GM/10ML suspension Commonly known  as:  CARAFATE Take 10 mLs (1 g total) by mouth 4 (four) times daily -  with meals and at bedtime.       Discharged Condition:Stable.    Consults: None.  Significant Diagnostic Studies: Dg Chest 2 View  Result Date: 03/04/2017 CLINICAL DATA:  Chest pain, epigastric pain for 1 week EXAM: CHEST  2 VIEW COMPARISON:  04/20/2011 FINDINGS: Borderline cardiomegaly. Mild hyperinflation. Probable chronic mild interstitial prominence stable. No infiltrate or pulmonary edema. Again noted probable old VP shunt tubing right chest. Surgical clips in left axilla again noted. IMPRESSION: No active cardiopulmonary disease. Mild hyperinflation. Stable probable chronic mild interstitial prominence. Electronically Signed   By: Lahoma Crocker M.D.   On: 03/04/2017 17:07    Lab Results: Basic Metabolic Panel:  Recent Labs  03/04/17 1549  NA 140  K 4.2  CL 101  CO2 28  GLUCOSE 122*  BUN 13  CREATININE 0.53  CALCIUM 9.4   Liver Function Tests:  Recent Labs  03/04/17 1549  AST 19  ALT 15  ALKPHOS 38  BILITOT 0.5  PROT 7.1  ALBUMIN 4.1     CBC:  Recent Labs  03/04/17 1548  WBC 5.5  NEUTROABS 3.3  HGB 13.3  HCT 39.6  MCV 95.9  PLT 266    No results found for this or any previous visit (from the past 240 hour(s)).   Hospital Course: This is a 76 year old lady who presented to the hospital with  chest pain radiating to the right arm. Please see initial history and physical examination for the initial evaluation. She has been monitored in telemetry overnight and has done very well. She is now chest pain-free. Serial troponin levels are all negative. She told me that every time she gets this pain and she either vomits or belches, the pain resolves.  Discharge Exam: Blood pressure (!) 131/58, pulse 80, temperature 98.7 F (37.1 C), temperature source Oral, resp. rate 18, height 5\' 3"  (1.6 m), weight 47.4 kg (104 lb 8 oz), SpO2 98 %. She looks systemic and well. She is hemodynamic  stable. Heart sounds are present without murmurs or pericardial rub. Lung fields are clear. She is alert and orientated without any focal neurological signs.  Disposition: Home. She will follow with her primary care physician very soon and I have told the patient to call the office and make an appointment. I'm going to send her home with Carafate to see if this will help if needed.  Discharge Instructions    Diet - low sodium heart healthy    Complete by:  As directed    Increase activity slowly    Complete by:  As directed         Signed: [place] C   03/05/2017, 9:39 AM

## 2017-03-27 ENCOUNTER — Encounter (HOSPITAL_COMMUNITY): Payer: Self-pay

## 2017-03-27 ENCOUNTER — Emergency Department (HOSPITAL_COMMUNITY)
Admission: EM | Admit: 2017-03-27 | Discharge: 2017-03-27 | Disposition: A | Discharge status: Home Health | Payer: Medicare Other | Attending: Emergency Medicine | Admitting: Emergency Medicine

## 2017-03-27 DIAGNOSIS — Z853 Personal history of malignant neoplasm of breast: Secondary | ICD-10-CM | POA: Diagnosis not present

## 2017-03-27 DIAGNOSIS — M79675 Pain in left toe(s): Secondary | ICD-10-CM | POA: Diagnosis present

## 2017-03-27 DIAGNOSIS — Z87891 Personal history of nicotine dependence: Secondary | ICD-10-CM | POA: Insufficient documentation

## 2017-03-27 DIAGNOSIS — B351 Tinea unguium: Secondary | ICD-10-CM

## 2017-03-27 DIAGNOSIS — Z79899 Other long term (current) drug therapy: Secondary | ICD-10-CM | POA: Diagnosis not present

## 2017-03-27 MED ORDER — POVIDONE-IODINE 10 % EX SOLN
CUTANEOUS | Status: AC
  Filled 2017-03-27: qty 118

## 2017-03-27 MED ORDER — LIDOCAINE HCL (PF) 2 % IJ SOLN
10.0000 mL | Freq: Once | INTRAMUSCULAR | Status: DC
  Filled 2017-03-27: qty 10

## 2017-03-27 MED ORDER — CEPHALEXIN 500 MG PO CAPS: 500.0000 mg | ORAL_CAPSULE | Freq: Three times a day (TID) | ORAL | 0 refills | Status: DC

## 2017-03-27 NOTE — ED Provider Notes (Signed)
[facility] DEPT Provider Note   CSN: [id number] Arrival date & time: 03/27/17  1307   By signing my name below, I, Eunice Blase, attest that this documentation has been prepared under the direction and in the presence of Tammi Deboraha Goar, PA-C. Electronically Signed: Eunice Blase, Scribe. 03/27/17. 2:09 PM.   History   Chief Complaint Chief Complaint  Patient presents with  . Toe Pain   The history is provided by the patient and medical records. No language interpreter was used.    Roberta Gonzalez is a 76 y.o. female with h/o cancer treated by chemotherapy that is currently in remission, who presents to the Emergency Department with concern for a partially detached L 3rd toenail x 1 week. She is unsure what caused this; she does not recall any trauma or injury to the area. Black discoloration and pain noted to toenail. Her daughter states this discoloration started when the pt began chemotherapy years ago. No treatments tried at home. No other modifying factors noted. Denies numbness, redness, red streaks or open wounds.    Past Medical History:  Diagnosis Date  . Arthritis   . Breast cancer ([place])    right in 2000 in [place] and underwent a mastectomy.  . Cancer ([place])    jan 2012/mastectomy L; chemo/rad tx  . Chest pain 06/10/2011  . Depression   . GERD (gastroesophageal reflux disease) 06/10/2011  . Hypercholesteremia   . 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 Neuropathy   . Osteoporosis   . Port catheter in place 11/29/2012    Patient Active Problem List   Diagnosis Date Noted  . Chest pain 03/04/2017  . Hypercholesterolemia 03/04/2017  . Personal history of colonic polyps 03/13/2014  . Family hx of colon cancer 03/13/2014  . Port catheter in place 11/29/2012  . 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 GERD (gastroesophageal reflux disease) 06/10/2011  . Chest pain 06/10/2011    Past Surgical History:  Procedure Laterality Date  .  ABDOMINAL HYSTERECTOMY    . CATARACT EXTRACTION W/PHACO Right 04/02/2013   Procedure: CATARACT EXTRACTION PHACO AND INTRAOCULAR LENS PLACEMENT (IOC);  Surgeon: [name], MD;  Location: [facility];  Service: Ophthalmology;  Laterality: Right;  CDE 22.44  . CATARACT EXTRACTION W/PHACO Left 04/26/2013   Procedure: CATARACT EXTRACTION PHACO AND INTRAOCULAR LENS PLACEMENT (IOC);  Surgeon: [name], MD;  Location: [facility];  Service: Ophthalmology;  Laterality: Left;  CDE: 23.60  . COLONOSCOPY N/A 04/05/2014   Procedure: COLONOSCOPY;  Surgeon: [name], MD;  Location: [facility];  Service: Endoscopy;  Laterality: N/A;  11:15  . cranial shunt    . ESOPHAGOGASTRODUODENOSCOPY (EGD) WITH ESOPHAGEAL DILATION N/A 05/03/2013   Procedure: ESOPHAGOGASTRODUODENOSCOPY (EGD) WITH ESOPHAGEAL DILATION;  Surgeon: [name], MD;  Location: [facility];  Service: Endoscopy;  Laterality: N/A;  145-moved to 100 Ann to notify pt  . hip replacements Bilateral   . JOINT REPLACEMENT    . MASTECTOMY Bilateral   . PORT-A-CATH REMOVAL Right 03/22/2016   Procedure: MINOR REMOVAL PORT-A-CATH RIGHT SUBCLAVIAN;  Surgeon: Aviva Signs, MD;  Location: [facility];  Service: General;  Laterality: Right;  start at 0841; stop at 0858  . TONSILLECTOMY      OB History    No data available       Home Medications    Prior to Admission medications   Medication Sig Start Date End Date Taking? Authorizing Provider  alendronate (FOSAMAX) 70 MG tablet Take 70 mg  by mouth every 7 (seven) days. Take with a full glass of water on an empty stomach.(sunday)   Yes [provider]  Cholecalciferol 1000 UNITS tablet Take 2,000 Units by mouth every morning.    Yes [provider]  diphenhydrAMINE (BENADRYL) 25 mg capsule Take 25 mg by mouth every 4 (four) hours as needed for allergies.   Yes [provider]  escitalopram (LEXAPRO) 20 MG tablet TAKE ONE TABLET BY MOUTH ONCE DAILY 08/23/16  Yes Kefalas, Manon Hilding,  PA-C  fexofenadine (ALLEGRA) 180 MG tablet Take 180 mg by mouth every morning.    Yes [provider]  fish oil-omega-3 fatty acids 1000 MG capsule Take 1 g by mouth 2 (two) times daily.    Yes [provider]  fluticasone (FLONASE) 50 MCG/ACT nasal spray Place 1 spray into both nostrils daily as needed for allergies.    Yes [provider]  pravastatin (PRAVACHOL) 40 MG tablet Take 40 mg by mouth daily.    Yes [provider]  Prenatal Vit-Fe Psac Cmplx-FA (PRENATAL MULTIVITAMIN) 60-1 MG tablet Take 1 tablet by mouth daily with breakfast.    Yes [provider]  sucralfate (CARAFATE) 1 GM/10ML suspension Take 10 mLs (1 g total) by mouth 4 (four) times daily -  with meals and at bedtime. Patient not taking: Reported on 03/27/2017 03/05/17   Doree Albee, MD    Family History Family History  Problem Relation Age of Onset  . Kidney disease Neg Hx   . Bladder Cancer Neg Hx     Social History Social History  Substance Use Topics  . Smoking status: Former Research scientist (life sciences)  . Smokeless tobacco: Never Used     Comment: quit smoking over 20 yrs ago  . Alcohol use Yes     Comment: glass of liquor about twice a week     Allergies   Codeine; Percodan [oxycodone-aspirin]; and Sulfa antibiotics   Review of Systems Review of Systems  Constitutional: Negative for activity change, appetite change, chills and fever.  Musculoskeletal: Positive for arthralgias.  Skin: Negative for color change, rash and wound.       +L 3rd toe pain  Neurological: Negative for weakness and numbness.  Hematological: Does not bruise/bleed easily.  All other systems reviewed and are negative.    Physical Exam Updated Vital Signs BP (!) 114/49 (BP Location: Right Arm)   Pulse 92   Temp 98.1 F (36.7 C) (Oral)   Resp 16   Ht 5\' 3"  (1.6 m)   Wt 108 lb 6.4 oz (49.2 kg)   SpO2 98%   BMI 19.20 kg/m   Physical Exam  Constitutional: She is oriented to person, place, and  time. She appears well-developed and well-nourished. No distress.  HENT:  Head: Normocephalic.  Neck: Normal range of motion.  Cardiovascular: Normal rate, regular rhythm and intact distal pulses.   Pulmonary/Chest: Effort normal and breath sounds normal.  Musculoskeletal: Normal range of motion. She exhibits tenderness. She exhibits no edema or deformity.  Tenderness with loosening of the L 3rd toenail, Pt has thickening and yellowing of several toenails to bilateral feet. No erythema or red streaks  Neurological: She is alert and oriented to person, place, and time. No sensory deficit.  Skin: Skin is warm. Capillary refill takes less than 2 seconds.  Psychiatric: She has a normal mood and affect.  Nursing note and vitals reviewed.    ED Treatments / Results  DIAGNOSTIC STUDIES: Oxygen Saturation is 98% on RA,  NL by my interpretation.    COORDINATION OF CARE: 2:06 PM-Discussed next steps with pt. Pt verbalized understanding and is agreeable with the plan. Pt prepared for nail removal.   Labs (all labs ordered are listed, but only abnormal results are displayed) Labs Reviewed - No data to display  EKG  EKG Interpretation None       Radiology No results found.  Procedures .Nail Removal Date/Time: 03/27/2017 2:05 PM Performed by: Kem Parkinson Authorized by: Kem Parkinson   Consent:    Consent obtained:  Verbal   Consent given by:  Patient   Risks discussed:  Infection, pain and bleeding   Alternatives discussed:  Referral Location:    Foot:  L fourth toe Pre-procedure details:    Skin preparation:  Betadine   Preparation: Patient was prepped and draped in the usual sterile fashion   Anesthesia (see MAR for exact dosages):    Anesthesia method:  Nerve block   Block needle gauge:  25 G   Block anesthetic:  Lidocaine 2% w/o epi   Block technique:  Digital block   Block injection procedure:  Anatomic landmarks palpated, negative aspiration for blood and anatomic  landmarks identified   Block outcome:  Anesthesia achieved Nail Removal:    Nail removed:  Complete   Nail bed repaired: no     Removed nail replaced and anchored: no   Trephination:    Subungual hematoma drained: no   Post-procedure details:    Patient tolerance of procedure:  Tolerated well, no immediate complications     (including critical care time)  Medications Ordered in ED Medications - No data to display   Initial Impression / Assessment and Plan / ED Course  I have reviewed the triage vital signs and the nursing notes.  Pertinent labs & imaging results that were available during my care of the patient were reviewed by me and considered in my medical decision making (see chart for details).     Pt well appearing.  No clinical signs of infection.  NV intact.  Bleeding controlled.  Toenail removed w/o difficulty. Rx for keflex, warm soaks, podiatry referral   Final Clinical Impressions(s) / ED Diagnoses   Final diagnoses:  Onychomycosis of toenail    New Prescriptions New Prescriptions   No medications on file   I personally performed the services described in this documentation, which was scribed in my presence. The recorded information has been reviewed and is accurate.    Kem Parkinson, PA-C 03/27/17 2130    Noemi Chapel, MD 03/28/17 [phone]

## 2017-03-27 NOTE — ED Triage Notes (Signed)
Pt has a toenail lifting off of left 3rd toenail. Started having problems approx week ago. Pt keeping neosporin and bandaid

## 2017-03-27 NOTE — Discharge Instructions (Signed)
Keep the toe bandaged.  Clean with mild soap and water.  Call the foot doctor listed to arrange a follow-up appt

## 2017-06-27 ENCOUNTER — Other Ambulatory Visit (HOSPITAL_COMMUNITY): Payer: Self-pay | Admitting: Oncology

## 2017-08-02 ENCOUNTER — Ambulatory Visit: Payer: Medicare Other | Admitting: Urology

## 2017-08-02 ENCOUNTER — Encounter: Payer: Self-pay | Admitting: Urology

## 2017-08-16 NOTE — Progress Notes (Signed)
08/17/2017 2:23 PM   Roberta Gonzalez 18-Feb-1941 [id number]  Referring provider: [name], MD [address] [place], [place] [address]  Chief Complaint  Patient presents with  . Nephrolithiasis    1year w/KUB    HPI: Patient is a 76 year old Caucasian female with a history of hematuria and a right renal stone who presents today for yearly visit.  Background history Patient was a referral from [facility] ED for a possible left renal stone.  Patient stated the onset of the pain was 3 weeks ago.   It was sharp.  It lasted all day.  The pain was located left and radiated to left waist.  The pain was a 10/10.  Nothing made the pain better.   Nothing made the pain worse.  She did not have gross hematuria, fevers, chills, nausea or vomiting.  In the ED, she was given pain med's and IV fluids.  UA noted 0-5 RBC's/hpf.   Serum creatinine 0.64.  Imaging studies noted minimal left pelviectasis and dilation of the left ureter with mild indistinctness of the left ureteral wall, nonspecific findings which may be seen with inflammatory changes or partial distal left ureteral obstruction. The distal portion of the left ureter is obscured by beam hardening artifact caused by hip prosthesis hardware. The urinary bladder is obscured by the same artifact.   Renal ultrasound completed on 06/29/2016 noted a 4 mm right lower pole renal calculus, 2 right renal cysts measuring up to 14 mm and no hydronephrosis.    Today, she is not experiencing any flank pain or gross hematuria.  She has not had fevers, chills, nausea or vomiting.    KUB taken on 08/17/2017 No radiopaque calcifications identified overlying either kidney or along the expected course of either renal collecting system.    PMH: Past Medical History:  Diagnosis Date  . Arthritis   . Breast cancer ([place])    right in 2000 in [place] and underwent a mastectomy.  . Cancer ([place])    jan 2012/mastectomy L; chemo/rad tx  . Chest pain  06/10/2011  . Depression   . GERD (gastroesophageal reflux disease) 06/10/2011  . Hypercholesteremia   . 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 Neuropathy   . Osteoporosis   . Port catheter in place 11/29/2012    Surgical History: Past Surgical History:  Procedure Laterality Date  . ABDOMINAL HYSTERECTOMY    . CATARACT EXTRACTION W/PHACO Right 04/02/2013   Procedure: CATARACT EXTRACTION PHACO AND INTRAOCULAR LENS PLACEMENT (IOC);  Surgeon: [name], MD;  Location: [facility];  Service: Ophthalmology;  Laterality: Right;  CDE 22.44  . CATARACT EXTRACTION W/PHACO Left 04/26/2013   Procedure: CATARACT EXTRACTION PHACO AND INTRAOCULAR LENS PLACEMENT (IOC);  Surgeon: [name], MD;  Location: [facility];  Service: Ophthalmology;  Laterality: Left;  CDE: 23.60  . COLONOSCOPY N/A 04/05/2014   Procedure: COLONOSCOPY;  Surgeon: [name], MD;  Location: [facility];  Service: Endoscopy;  Laterality: N/A;  11:15  . cranial shunt    . ESOPHAGOGASTRODUODENOSCOPY (EGD) WITH ESOPHAGEAL DILATION N/A 05/03/2013   Procedure: ESOPHAGOGASTRODUODENOSCOPY (EGD) WITH ESOPHAGEAL DILATION;  Surgeon: [name], MD;  Location: [facility];  Service: Endoscopy;  Laterality: N/A;  145-moved to 100 Ann to notify pt  . hip replacements Bilateral   . JOINT REPLACEMENT    . MASTECTOMY Bilateral   . PORT-A-CATH REMOVAL Right 03/22/2016   Procedure: MINOR REMOVAL PORT-A-CATH RIGHT SUBCLAVIAN;  Surgeon: Aviva Signs, MD;  Location:  [facility];  Service: General;  Laterality: Right;  start at 0841; stop at 0858  . TONSILLECTOMY      Home Medications:  Allergies as of 08/17/2017      Reactions   Codeine Other (See Comments)   Makes pt nervous   Percodan [oxycodone-aspirin] Other (See Comments)   Mood change (climbing the walls, angry, irritable)   Sulfa Antibiotics Nausea Only      Medication List       Accurate as of 08/17/17  2:23 PM. Always use your most recent med list.          alendronate  70 MG tablet Commonly known as:  FOSAMAX Take 70 mg by mouth every 7 (seven) days. Take with a full glass of water on an empty stomach.(sunday)   diphenhydrAMINE 25 mg capsule Commonly known as:  BENADRYL Take 25 mg by mouth every 4 (four) hours as needed for allergies.   escitalopram 20 MG tablet Commonly known as:  LEXAPRO TAKE ONE TABLET BY MOUTH ONCE DAILY   fexofenadine 180 MG tablet Commonly known as:  ALLEGRA Take 180 mg by mouth every morning.   fish oil-omega-3 fatty acids 1000 MG capsule Take 1 g by mouth 2 (two) times daily.   fluticasone 50 MCG/ACT nasal spray Commonly known as:  FLONASE Place 1 spray into both nostrils daily as needed for allergies.   pravastatin 40 MG tablet Commonly known as:  PRAVACHOL Take 40 mg by mouth daily.   prenatal multivitamin 60-1 MG tablet Take 1 tablet by mouth daily with breakfast.       Allergies:  Allergies  Allergen Reactions  . Codeine Other (See Comments)    Makes pt nervous  . Percodan [Oxycodone-Aspirin] Other (See Comments)    Mood change (climbing the walls, angry, irritable)  . Sulfa Antibiotics Nausea Only    Family History: Family History  Problem Relation Age of Onset  . Kidney disease Neg Hx   . Bladder Cancer Neg Hx     Social History:  reports that she has quit smoking. She has never used smokeless tobacco. She reports that she drinks alcohol. She reports that she does not use drugs.  ROS: UROLOGY Frequent Urination?: No Hard to postpone urination?: No Burning/pain with urination?: No Get up at night to urinate?: No Leakage of urine?: No Urine stream starts and stops?: Yes Trouble starting stream?: No Do you have to strain to urinate?: No Blood in urine?: No Urinary tract infection?: No Sexually transmitted disease?: No Injury to kidneys or bladder?: No Painful intercourse?: No Weak stream?: No Currently pregnant?: No Vaginal bleeding?: No Last menstrual period?:  n  Gastrointestinal Nausea?: No Vomiting?: No Indigestion/heartburn?: No Diarrhea?: No Constipation?: No  Constitutional Fever: No Night sweats?: No Weight loss?: No Fatigue?: No  Skin Skin rash/lesions?: No Itching?: No  Eyes Blurred vision?: No Double vision?: No  Ears/Nose/Throat Sore throat?: No Sinus problems?: Yes  Hematologic/Lymphatic Swollen glands?: No Easy bruising?: No  Cardiovascular Leg swelling?: No Chest pain?: No  Respiratory Cough?: No Shortness of breath?: No  Endocrine Excessive thirst?: No  Musculoskeletal Back pain?: Yes Joint pain?: Yes  Neurological Headaches?: No Dizziness?: No  Psychologic Depression?: Yes Anxiety?: No  Physical Exam: BP 110/67   Pulse 69   Ht 5\' 3"  (1.6 m)   Wt 108 lb (49 kg)   BMI 19.13 kg/m   Constitutional: Well nourished. Alert and oriented, No acute distress. HEENT: [employer] AT, moist mucus membranes. Trachea midline, no masses. Cardiovascular: No  clubbing, cyanosis, or edema. Respiratory: Normal respiratory effort, no increased work of breathing. Skin: No rashes, bruises or suspicious lesions. Lymph: No cervical or inguinal adenopathy. Neurologic: Grossly intact, no focal deficits, moving all 4 extremities. Psychiatric: Normal mood and affect.  Laboratory Data: Lab Results  Component Value Date   WBC 5.5 03/04/2017   HGB 13.3 03/04/2017   HCT 39.6 03/04/2017   MCV 95.9 03/04/2017   PLT 266 03/04/2017    Lab Results  Component Value Date   CREATININE 0.53 03/04/2017     Lab Results  Component Value Date   AST 19 03/04/2017   Lab Results  Component Value Date   ALT 15 03/04/2017   I have reviewed the labs  Pertinent Imaging: CLINICAL DATA:  Initial evaluation for nephrolithiasis.  EXAM: ABDOMEN - 1 VIEW  COMPARISON:  Prior CT from 05/28/2016.  FINDINGS: Bowel gas pattern within normal limits without evidence for obstruction. No abnormal calcification seen overlying  either kidney to suggest nephrolithiasis. No calcifications seen along the course expected course of either renal collecting system to suggest ureterolithiasis. No stones overlie the region of the bladder. Calcified pelvic phleboliths noted.  No soft tissue mass.  Dextroscoliosis with multilevel degenerative spondylolysis noted within the visualized spine. 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 Osteopenia.  IMPRESSION: No radiopaque calcifications identified overlying either kidney or along the expected course of either renal collecting system.   Electronically Signed   By: Jeannine Boga M.D.   On: 08/17/2017 17:01  I have independently reviewed the films  Assessment & Plan:    1. History of hematuria  - patient does not report any gross hematuria.    2. Right renal stone  - KUB today demonstrates no stones  - RTC in one year for KUB and symptom recheck    Return for KUB and office visit.  These notes generated with voice recognition software. I apologize for typographical errors.  Zara Council, [facility] [address] [place],  [address] [phone]

## 2017-08-17 ENCOUNTER — Ambulatory Visit
Admission: RE | Admit: 2017-08-17 | Discharge: 2017-08-17 | Disposition: A | Discharge status: Home / Self Care | Payer: Medicare Other | Source: Ambulatory Visit | Attending: Urology | Admitting: Urology

## 2017-08-17 ENCOUNTER — Other Ambulatory Visit: Payer: Self-pay | Admitting: Urology

## 2017-08-17 ENCOUNTER — Encounter: Payer: Self-pay | Admitting: Urology

## 2017-08-17 ENCOUNTER — Ambulatory Visit (INDEPENDENT_AMBULATORY_CARE_PROVIDER_SITE_OTHER): Payer: Medicare Other | Admitting: Urology

## 2017-08-17 VITALS — BP 110/67 | HR 69 | Ht 63.0 in | Wt 108.0 lb

## 2017-08-17 DIAGNOSIS — Z87448 Personal history of other diseases of urinary system: Secondary | ICD-10-CM

## 2017-08-17 DIAGNOSIS — Z87442 Personal history of urinary calculi: Secondary | ICD-10-CM

## 2017-08-17 DIAGNOSIS — N200 Calculus of kidney: Secondary | ICD-10-CM | POA: Diagnosis not present

## 2017-09-14 ENCOUNTER — Other Ambulatory Visit (HOSPITAL_COMMUNITY): Payer: Medicare Other

## 2017-09-14 ENCOUNTER — Ambulatory Visit (HOSPITAL_COMMUNITY): Payer: Medicare Other

## 2017-09-26 ENCOUNTER — Other Ambulatory Visit (HOSPITAL_COMMUNITY): Payer: Self-pay | Admitting: *Deleted

## 2017-09-26 DIAGNOSIS — C50919 Malignant neoplasm of unspecified site of unspecified female breast: Secondary | ICD-10-CM

## 2017-09-27 ENCOUNTER — Other Ambulatory Visit (HOSPITAL_COMMUNITY): Payer: Medicare Other

## 2017-09-27 ENCOUNTER — Ambulatory Visit (HOSPITAL_COMMUNITY): Payer: Medicare Other | Admitting: Adult Health

## 2017-10-04 NOTE — Progress Notes (Deleted)
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      11/27/2010 Surgery    Left simple mastectomy: Multicentric invasive ductal cancer, 2.1 and 0.4 cm, +LVI, 2/8 positive lymph nodes.  ER-, PR-, HER2 3+, Ki-67 82%.      12/07/2010 - 05/03/2011 Chemotherapy    Carboplatin/Taxotere x 6 cycles (Approximate dates)      12/07/2010 - 12/03/2011 Antibody Plan    52 weeks of Herceptin (Aproximate start date)      05/05/2011 Remission           HISTORY OF PRESENT ILLNESS:  (from Dr. Metro Kung last note on 09/16/16)  Roberta Gonzalez 76 y.o. 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 HER2 was positive with a ratio of 2.09. Primary was 2.1 cm. She had a 2nd lesion that was 0.4 cm. Underwent mastectomy 11/27/2010. She therefore had a T2 N1 MX cancer. She has had 6 cycles of carboplatin with Taxotere and 52 weeks of Herceptin (finishing on 12/03/2011).  Patient recently had hydronephrosis due to kidney stone had multiple ultrasound done at urology department in [facility]   Hydronephrosis  has resolved. She continues to have hip pain.  Had a bone scan done several months ago major abnormality was detected   INTERVAL HISTORY:  Roberta Gonzalez 76 y.o. female returns for routine follow-up for left breast cancer.   ***Last (R) breast mammogram??? (we  don't have records)***    REVIEW OF SYSTEMS:  Review of Systems - Oncology   PAST MEDICAL/SURGICAL HISTORY:  Past Medical History:  Diagnosis Date  . Arthritis   . Breast cancer ([place])    right in 2000 in [place] and underwent a mastectomy.  . Cancer ([place])    jan 2012/mastectomy L; chemo/rad tx  . Chest pain 06/10/2011  . Depression   . GERD (gastroesophageal reflux disease) 06/10/2011  . Hypercholesteremia   . Invasive ductal carcinoma of breast ([place]) 06/10/2011  . Neuropathy   . Osteoporosis   . Port catheter in place 11/29/2012   Past Surgical History:  Procedure Laterality Date  . ABDOMINAL HYSTERECTOMY    . cranial shunt    . hip replacements Bilateral   . JOINT REPLACEMENT    . MASTECTOMY Bilateral   . TONSILLECTOMY       SOCIAL HISTORY:  Social History   Socioeconomic History  . Marital status: Widowed    Spouse name: Not on file  . Number of children: Not on file  . Years of education: Not on file  . Highest education level: Not on file  Social Needs  . Financial resource strain: Not on file  . Food insecurity - worry: Not on file  . Food insecurity - inability: Not on file  . Transportation needs - medical: Not on file  . Transportation needs - non-medical: Not on file  Occupational History  .  Not on file  Tobacco Use  . Smoking status: Former Research scientist (life sciences)  . Smokeless tobacco: Never Used  . Tobacco comment: quit smoking over 20 yrs ago  Substance and Sexual Activity  . Alcohol use: Yes    Comment: glass of liquor about twice a week  . Drug use: No  . Sexual activity: Yes    Birth control/protection: Surgical  Other Topics Concern  . Not on file  Social History Narrative  . Not on file    FAMILY HISTORY:  Family History  Problem Relation Age of Onset  . Kidney disease Neg Hx   . Bladder Cancer Neg Hx     CURRENT MEDICATIONS:  Outpatient Encounter Medications as of 10/06/2017  Medication Sig  . alendronate (FOSAMAX) 70 MG tablet Take 70 mg  by mouth every 7 (seven) days. Take with a full glass of water on an empty stomach.(sunday)  . diphenhydrAMINE (BENADRYL) 25 mg capsule Take 25 mg by mouth every 4 (four) hours as needed for allergies.  Marland Kitchen escitalopram (LEXAPRO) 20 MG tablet TAKE ONE TABLET BY MOUTH ONCE DAILY  . fexofenadine (ALLEGRA) 180 MG tablet Take 180 mg by mouth every morning.   . fish oil-omega-3 fatty acids 1000 MG capsule Take 1 g by mouth 2 (two) times daily.   . fluticasone (FLONASE) 50 MCG/ACT nasal spray Place 1 spray into both nostrils daily as needed for allergies.   . pravastatin (PRAVACHOL) 40 MG tablet Take 40 mg by mouth daily.   . Prenatal Vit-Fe Psac Cmplx-FA (PRENATAL MULTIVITAMIN) 60-1 MG tablet Take 1 tablet by mouth daily with breakfast.    No facility-administered encounter medications on file as of 10/06/2017.     ALLERGIES:  Allergies  Allergen Reactions  . Codeine Other (See Comments)    Makes pt nervous  . Percodan [Oxycodone-Aspirin] Other (See Comments)    Mood change (climbing the walls, angry, irritable)  . Sulfa Antibiotics Nausea Only     PHYSICAL EXAM:  ECOG Performance status: ***  There were no vitals filed for this visit. There were no vitals filed for this visit.  Physical Exam   LABORATORY DATA:  I have reviewed the labs as listed.  CBC    Component Value Date/Time   WBC 5.5 03/04/2017 1548   RBC 4.13 03/04/2017 1548   HGB 13.3 03/04/2017 1548   HCT 39.6 03/04/2017 1548   PLT 266 03/04/2017 1548   MCV 95.9 03/04/2017 1548   MCH 32.2 03/04/2017 1548   MCHC 33.6 03/04/2017 1548   RDW 12.5 03/04/2017 1548   LYMPHSABS 1.4 03/04/2017 1548   MONOABS 0.6 03/04/2017 1548   EOSABS 0.2 03/04/2017 1548   BASOSABS 0.0 03/04/2017 1548   CMP Latest Ref Rng & Units 03/04/2017 05/28/2016 02/19/2016  Glucose 65 - 99 mg/dL 122(H) 128(H) 100(H)  BUN 6 - 20 mg/dL _0 Creatinine 0.44 - 1.00 mg/dL 0.53 0.64 0.50  Sodium 135 - 145 mmol/L 140 140 138  Potassium 3.5 - 5.1  mmol/L 4.2 3.9 4.0  Chloride 101 - 111 mmol/L 101 108 105  CO2 22 - 32 mmol/L _1 Calcium 8.9 - 10.3 mg/dL 9.4 8.9 9.0  Total Protein 6.5 - 8.1 g/dL 7.1 - 6.9  Total Bilirubin 0.3 - 1.2 mg/dL 0.5 - 0.6  Alkaline Phos 38 - 126 U/L 38 - 38  AST 15 - 41 U/L 19 - 16  ALT 14 - 54 U/L 15 - 14    PENDING LABS:  DIAGNOSTIC IMAGING:  *The following radiologic images and reports have been reviewed independently and agree with below findings.  ***  PATHOLOGY:  (L) mastectomy surgical path: 11/27/10 REPORT OF SURGICAL PATHOLOGY  Accession #: [id number] Patient Name: Roberta Gonzalez, Roberta Gonzalez Visit # : [id number]  MRN: [id number] Physician: Aviva Signs DOB/Age [birth year] (Age: 28) Gender: F Collected Date: 11/27/2010 Received Date: 11/27/2010  FINAL DIAGNOSIS  1. Breast, simple mastectomy, left : - MULTICENTRIC INVASIVE DUCTAL CARCINOMA, GRADE III/III, SPANNING 2.1 AND 0.4 CM. - DUCTAL CARCINOMA IN SITU, HIGH GRADE. - LYMPHOVASCULAR INVASION IS IDENTIFIED. - THE SURGICAL RESECTION MARGINS APPEAR NEGATIVE FOR CARCINOMA. - SEE ONCOLOGY TABLE BELOW.  2. Lymph nodes, regional resection, axillary, left : - METASTATIC CARCINOMA IN 2 OF 8 LYMPH NODES (2/8), IDENTIFIED BY H AND E STAIN. - SEE COMMENT.  ELECTRONIC SIGNATURE : Suan Halter, Vonna Kotyk, Industrial/product designer, Electronic Signature  MICROSCOPIC DESCRIPTION 1. BREAST, WITH LYMPH NODE SAMPLING Specimen, including laterality: Left breast. Procedure: Simple mastectomy. Tumor size of largest invasive carcinoma (gross measurement): 2.1 cm Margins: Negative for carcinoma. Invasive component, distance to closest margin: 0.3 cm to inferior medial soft tissue margin (gross measurement) In situ component, distance to closest margin: 0.3 cm to inferior medial soft tissue margin (gross measurement) Lymph - Vascular invasion: Present. Histologic type, invasive component: Ductal Grade, invasive component (Nottingham  combined histologic score): III Tubule formation grade: 2 Nuclear pleomorphism grade: 3 Mitotic grade: 3 Ductal carcinoma in situ: Present. Nuclear grade: High grade Necrosis: Present. Extensive intraductal component: Yes. Lobular neoplasia present: Not identified. Treatment effect (if treated with neoadjuvant therapy): N/A Multicentric (separate tumors in different quadrants): Yes. Multifocal (separate tumors in same quadrant or biopsy): No. Macroscopic or microscopic extent of tumor: Skin: Microscopically involves deep portions of dermis Nipple: Not identified. Skeletal muscle: Not identified. Axillary lymph nodes: Number examined: 8 Number with metastasis: 2 ITC (isolated tumor cells, < 0.59m): 0 Micrometastasis (> 0.267m < 54m20m 0 Metastasis > 2 mm: 2 Extracapsular extension: Not identified Method of detection of metastases: H and E TNM Code: mpT2, mpN1a Breast Prognostic Markers: [id number] Estrogen receptor: Negative (0%) Progesterone receptor: Negative (0%) Ki 67 (Mib-1): 89% Her 2 neu by CISH: The ratio was 2.09, which is in the equivocal range for Her 2 amplification. Comments: There are two foci of grade III invasive ductal carcinoma, one of which is associated with metallic clip and spans 2.1 cm. This focus is subjacent to the overlying skin but there are no associated gross changes in the skin. The smaller focus is present in random tissue submitted form the inferior medial quadrant. These foci are morphologically identical. The vaguely nodular tissue superior to the larger focus of invasive carcinoma is sampled thoroughly revealing fibrocystic changes with adenosis, fibroadenoma(s), radial scar(s) and benign lobules with calcifications. No malignancy is identified in this area. The grossly identified well defined mass in the inferior lateral specimen reveals fibrocystic changes with features of previous rupture. A  breast prognostic profile will be repeated on the larger lesion and the results reported separately. (JBK:gt, 11/30/10) 2. The entire specimen has been submitted for histologic evaluation, revealing 8 total lymph nodes, 2 of which harbor metastatic carcinoma.         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 Treated with (L) mastectomy, followed by adjuvant chemo with Taxotere/Carbo x 6 cycles completed on 05/03/11. Then she went on to complete 1 total year of Herceptin, which she completed on 12/03/11.   -No  role for anti-estrogen therapy given ER/PR negative.  -Last (R) breast screening mammogram???*** -Clinical breast exam performed today and benign.  She is now nearly 7 years out from her initial breast cancer diagnosis without evidence of recurrence; this is very favorable.  -Return to cancer center in 1 year for follow-up. ***  Bone health:  -Last DEXA scan on 05/02/15 showed osteoporosis with T-score -4.1.  -On Fosamax; managed by PCP.  Defer continued management to PCP.  -She had bone scan and plain film X-rays in 2017 d/t pain; they were negative for metastatic disease, which is reassuring.   -Continue calcium/vitamin D supplementation, as well as weight-bearing exercises as tolerated.      Dispo:  -(R) breast unilateral screening mammogram ???*** -Return to cancer center in 1 year for follow-up. ***    All questions were answered to patient's stated satisfaction. Encouraged patient to call with any new concerns or questions before her next visit to the cancer center and we can certain see her sooner, if needed.    Plan of care discussed with Dr. Talbert Cage, who agrees with the above aforementioned.    Orders placed this encounter:  No orders of the defined types were placed in this encounter.     Mike Craze, NP [place] [phone]

## 2017-10-06 ENCOUNTER — Other Ambulatory Visit (HOSPITAL_COMMUNITY): Payer: Medicare Other

## 2017-10-06 ENCOUNTER — Ambulatory Visit (HOSPITAL_COMMUNITY): Payer: Medicare Other | Admitting: Adult Health

## 2017-10-17 ENCOUNTER — Encounter (HOSPITAL_COMMUNITY): Payer: Medicare Other | Attending: Oncology

## 2017-10-17 ENCOUNTER — Other Ambulatory Visit: Payer: Self-pay

## 2017-10-17 ENCOUNTER — Encounter (HOSPITAL_BASED_OUTPATIENT_CLINIC_OR_DEPARTMENT_OTHER): Payer: Medicare Other | Admitting: Oncology

## 2017-10-17 ENCOUNTER — Other Ambulatory Visit (HOSPITAL_COMMUNITY)
Admission: RE | Admit: 2017-10-17 | Discharge: 2017-10-17 | Disposition: A | Discharge status: Home / Self Care | Payer: Medicare Other | Source: Ambulatory Visit | Attending: Family Medicine | Admitting: Family Medicine

## 2017-10-17 ENCOUNTER — Encounter (HOSPITAL_COMMUNITY): Payer: Self-pay | Admitting: Oncology

## 2017-10-17 VITALS — BP 99/32 | HR 89 | Temp 97.7°F | Resp 16 | Ht 63.0 in | Wt 110.0 lb

## 2017-10-17 DIAGNOSIS — R5383 Other fatigue: Secondary | ICD-10-CM | POA: Insufficient documentation

## 2017-10-17 DIAGNOSIS — Z853 Personal history of malignant neoplasm of breast: Secondary | ICD-10-CM

## 2017-10-17 DIAGNOSIS — E559 Vitamin D deficiency, unspecified: Secondary | ICD-10-CM | POA: Diagnosis present

## 2017-10-17 DIAGNOSIS — E7849 Other hyperlipidemia: Secondary | ICD-10-CM | POA: Diagnosis present

## 2017-10-17 DIAGNOSIS — C50919 Malignant neoplasm of unspecified site of unspecified female breast: Secondary | ICD-10-CM | POA: Insufficient documentation

## 2017-10-17 DIAGNOSIS — D510 Vitamin B12 deficiency anemia due to intrinsic factor deficiency: Secondary | ICD-10-CM | POA: Insufficient documentation

## 2017-10-17 DIAGNOSIS — I110 Hypertensive heart disease with heart failure: Secondary | ICD-10-CM | POA: Diagnosis present

## 2017-10-17 DIAGNOSIS — M810 Age-related osteoporosis without current pathological fracture: Secondary | ICD-10-CM

## 2017-10-17 DIAGNOSIS — M25551 Pain in right hip: Secondary | ICD-10-CM | POA: Diagnosis not present

## 2017-10-17 LAB — CBC
HEMATOCRIT: 42.5 % (ref 36.0–46.0)
HEMOGLOBIN: 13.3 g/dL (ref 12.0–15.0)
MCH: 31.6 pg (ref 26.0–34.0)
MCHC: 31.3 g/dL (ref 30.0–36.0)
MCV: 101 fL — AB (ref 78.0–100.0)
Platelets: 242 10*3/uL (ref 150–400)
RBC: 4.21 MIL/uL (ref 3.87–5.11)
RDW: 12.4 % (ref 11.5–15.5)
WBC: 5.5 10*3/uL (ref 4.0–10.5)

## 2017-10-17 LAB — COMPREHENSIVE METABOLIC PANEL
ALBUMIN: 4.1 g/dL (ref 3.5–5.0)
ALK PHOS: 46 U/L (ref 38–126)
ALT: 13 U/L — AB (ref 14–54)
ALT: 15 U/L (ref 14–54)
AST: 17 U/L (ref 15–41)
AST: 16 U/L (ref 15–41)
Albumin: 4 g/dL (ref 3.5–5.0)
Alkaline Phosphatase: 46 U/L (ref 38–126)
Anion gap: 7 (ref 5–15)
Anion gap: 7 (ref 5–15)
BILIRUBIN TOTAL: 0.3 mg/dL (ref 0.3–1.2)
BILIRUBIN TOTAL: 0.5 mg/dL (ref 0.3–1.2)
BUN: 19 mg/dL (ref 6–20)
BUN: 19 mg/dL (ref 6–20)
CALCIUM: 9.2 mg/dL (ref 8.9–10.3)
CHLORIDE: 107 mmol/L (ref 101–111)
CO2: 26 mmol/L (ref 22–32)
CO2: 27 mmol/L (ref 22–32)
CREATININE: 0.56 mg/dL (ref 0.44–1.00)
Calcium: 9.2 mg/dL (ref 8.9–10.3)
Chloride: 107 mmol/L (ref 101–111)
Creatinine, Ser: 0.57 mg/dL (ref 0.44–1.00)
GFR calc Af Amer: >60 mL/min (ref >60)
GFR calc non Af Amer: >60 mL/min (ref >60)
GLUCOSE: 111 mg/dL — AB (ref 65–99)
Glucose, Bld: 111 mg/dL — ABNORMAL HIGH (ref 65–99)
POTASSIUM: 4.1 mmol/L (ref 3.5–5.1)
Potassium: 4.1 mmol/L (ref 3.5–5.1)
SODIUM: 140 mmol/L (ref 135–145)
Sodium: 141 mmol/L (ref 135–145)
TOTAL PROTEIN: 7 g/dL (ref 6.5–8.1)
TOTAL PROTEIN: 7.1 g/dL (ref 6.5–8.1)

## 2017-10-17 LAB — CBC WITH DIFFERENTIAL/PLATELET
BASOS PCT: 1 %
Basophils Absolute: 0 10*3/uL (ref 0.0–0.1)
Eosinophils Absolute: 0.3 10*3/uL (ref 0.0–0.7)
Eosinophils Relative: 5 %
HEMATOCRIT: 41.7 % (ref 36.0–46.0)
Hemoglobin: 13.2 g/dL (ref 12.0–15.0)
Lymphocytes Relative: 25 %
Lymphs Abs: 1.3 10*3/uL (ref 0.7–4.0)
MCH: 32 pg (ref 26.0–34.0)
MCHC: 31.7 g/dL (ref 30.0–36.0)
MCV: 101 fL — ABNORMAL HIGH (ref 78.0–100.0)
Monocytes Absolute: 0.7 10*3/uL (ref 0.1–1.0)
Monocytes Relative: 14 %
NEUTROS ABS: 2.9 10*3/uL (ref 1.7–7.7)
NEUTROS PCT: 55 %
Platelets: 266 10*3/uL (ref 150–400)
RBC: 4.13 MIL/uL (ref 3.87–5.11)
RDW: 12.4 % (ref 11.5–15.5)
WBC: 5.2 10*3/uL (ref 4.0–10.5)

## 2017-10-17 LAB — LIPID PANEL
CHOLESTEROL: 171 mg/dL (ref 0–200)
HDL: 57 mg/dL (ref >40)
LDL Cholesterol: 97 mg/dL (ref 0–99)
TRIGLYCERIDES: 84 mg/dL (ref <150)
Total CHOL/HDL Ratio: 3 RATIO
VLDL: 17 mg/dL (ref 0–40)

## 2017-10-17 LAB — VITAMIN B12: Vitamin B-12: 196 pg/mL (ref 180–914)

## 2017-10-17 LAB — TSH: TSH: 0.846 u[IU]/mL (ref 0.350–4.500)

## 2017-10-17 NOTE — Progress Notes (Signed)
Roberta Gaskins, MD [address] [place] [address]  No diagnosis found.  CURRENT THERAPY: Surveillance per NCCN guidelines  INTERVAL HISTORY: Roberta Gonzalez 76 y.o. 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 HER2 was positive with a ratio of 2.09. Primary was 2.1 cm. She had a 2nd lesion that was 0.4 cm. Underwent mastectomy 11/27/2010. She therefore had a T2 N1 MX cancer. She has had 6 cycles of carboplatin with Taxotere and 52 weeks of Herceptin (finishing on 12/03/2011).  Patient recently had hydronephrosis due to kidney stone had multiple ultrasound done at urology department in [facility]   Hydronephrosis  has resolved. She continues to have hip pain.  Had a bone scan done several months ago major abnormality was detected October 17, 2017.  Patient is here for further follow-up regarding bilateral carcinoma of breast with bilateral mastectomy. Patient at couple of emergency room visit with minor problem.  Left hip pain persists but has not changed in severity. No other bony pains.  Appetite remains stable and has not lost any weight.   Invasive ductal carcinoma of breast ([place])   10/28/2010 Initial Diagnosis    Breast, left, needle core biopsy, :  - INVASIVE DUCTAL CARCINOMA.      11/27/2010 Surgery    Left simple mastectomy: Multicentric invasive ductal cancer, 2.1 and 0.4 cm, +LVI, 2/8 positive lymph nodes.  ER-, PR-, HER2 3+, Ki-67 82%.      12/07/2010 - 05/03/2011 Chemotherapy    Carboplatin/Taxotere x 6 cycles (Approximate dates)      12/07/2010 - 12/03/2011 Antibody Plan    52 weeks of Herceptin (Aproximate start date)      05/05/2011 Remission           Port has been removed  She is still taking calcium and vitamin D every day.   Past Medical History:    Diagnosis Date  . Arthritis   . Breast cancer ([place])    right in 2000 in [place] and underwent a mastectomy.  . Cancer ([place])    jan 2012/mastectomy L; chemo/rad tx  . Chest pain 06/10/2011  . Depression   . GERD (gastroesophageal reflux disease) 06/10/2011  . Hypercholesteremia   . Invasive ductal carcinoma of breast ([place]) 06/10/2011  . Neuropathy   . Osteoporosis   . Port catheter in place 11/29/2012    has Invasive ductal carcinoma of breast ([place]); GERD (gastroesophageal reflux disease); Chest pain; Port catheter in place; Personal history of colonic polyps; Family hx of colon cancer; Chest pain; and Hypercholesterolemia on their problem list.     is allergic to codeine; percodan [oxycodone-aspirin]; and sulfa antibiotics.  Roberta Gonzalez had no medications administered during this visit.  Past Surgical History:  Procedure Laterality Date  . ABDOMINAL HYSTERECTOMY    . CATARACT EXTRACTION W/PHACO Right 04/02/2013   Procedure: CATARACT EXTRACTION PHACO AND INTRAOCULAR LENS PLACEMENT (IOC);  Surgeon: [name], MD;  Location: [facility];  Service: Ophthalmology;  Laterality: Right;  CDE 22.44  . CATARACT EXTRACTION W/PHACO Left 04/26/2013   Procedure: CATARACT EXTRACTION PHACO AND INTRAOCULAR LENS PLACEMENT (IOC);  Surgeon: [name], MD;  Location: [facility];  Service: Ophthalmology;  Laterality: Left;  CDE: 23.60  . COLONOSCOPY N/A 04/05/2014   Procedure: COLONOSCOPY;  Surgeon: [name], MD;  Location:  [facility];  Service: Endoscopy;  Laterality: N/A;  11:15  . cranial shunt    . ESOPHAGOGASTRODUODENOSCOPY (EGD) WITH ESOPHAGEAL DILATION N/A 05/03/2013   Procedure: ESOPHAGOGASTRODUODENOSCOPY (EGD) WITH ESOPHAGEAL DILATION;  Surgeon: [name], MD;  Location: [facility];  Service: Endoscopy;  Laterality: N/A;  145-moved to 100 Ann to notify pt  . hip replacements Bilateral   . JOINT REPLACEMENT    . MASTECTOMY Bilateral   . PORT-A-CATH REMOVAL Right 03/22/2016    Procedure: MINOR REMOVAL PORT-A-CATH RIGHT SUBCLAVIAN;  Surgeon: Aviva Signs, MD;  Location: [facility];  Service: General;  Laterality: Right;  start at 0841; stop at 0858  . TONSILLECTOMY     Social History She was born in [place]. She has two daughters, five grandchildren, and two great-grandchildren. Her youngest daughter lives with her, along with her grandson.  Positive for hip pain. Right hip pain for the past couple of weeks. Right buttocks swollen. Patient also had a kidney stone with hydronephrosis which is resolved Review of Systems  Denies any headaches, dizziness, double vision, fevers, chills, night sweats, nausea, vomiting, diarrhea, constipation, chest pain, heart palpitations, shortness of breath, blood in stool, black tarry stool, urinary pain, urinary burning, urinary frequency, hematuria.  14 point review of systems was performed and is negative except as detailed under history of present illness and above    PHYSICAL EXAMINATION  ECOG PERFORMANCE STATUS: 0 - Asymptomatic  There were no vitals filed for this visit.  GENERAL:alert, no distress, well nourished, well developed, comfortable, cooperative, smiling and accompanied by daughter  SKIN: skin color, texture, turgor are normal, no rashes or significant lesions HEAD: Normocephalic, No masses, lesions, tenderness or abnormalities EYES: normal, PERRLA, EOMI, Conjunctiva are pink and non-injected EARS: External ears normal OROPHARYNX:lips, buccal mucosa, and tongue normal and mucous membranes are moist  NECK: supple, no adenopathy, thyroid normal size, non-tender, without nodularity, no stridor, non-tender, trachea midline LYMPH:  no palpable lymphadenopathy BREAST:post-mastectomy bilaterally site well healed  Bilateral mastectomies; no palpable nodules  Left side has radiation changes, angioectasias LUNGS: clear to auscultation  HEART: regular rate & rhythm, no murmurs, no gallops, S1 normal and S2  normal ABDOMEN:abdomen soft, non-tender, normal bowel sounds and no masses or organomegaly BACK: Back symmetric, no curvature., No CVA tenderness EXTREMITIES:less then 2 second capillary refill, no joint deformities, effusion, or inflammation, no edema, no skin discoloration, no clubbing, no cyanosis  NEURO: alert & oriented x 3 with fluent speech, no focal motor/sensory deficits, gait slow, abnormal   LABORATORY DATA: I have reviewed the data as listed.  CBC  CBC Latest Ref Rng & Units 10/17/2017 10/17/2017 03/04/2017  WBC 4.0 - 10.5 K/uL 5.5 5.2 5.5  Hemoglobin 12.0 - 15.0 g/dL 13.3 13.2 13.3  Hematocrit 36.0 - 46.0 % 42.5 41.7 39.6  Platelets 150 - 400 K/uL 242 266 266     ASSESSMENT AND PLAN:  Stage II, grade 3 invasive ductal carcinoma of the left breast with 2/8 positive nodes, ER negative, PR negative, HER2 3+ positive, Osteoporosis.  Status post bilateral mastectomy without any evidence of recurrent disease  She mentions her R hip pain multiple times throughout her visit today. Given her disease histology I recommended a bone scan and she is agreeable.  She continues on Fosamax for her osteoporosis. This is managed by her primary care doctor. I have advised her to take calcium 1200 mg daily and between 1000 to 2000 international units of vitamin D.  Scan has been essentially negative  there is some increased uptake in the left hip patient did have surgery before.  Pain is mainly localized on the right side.  No additional x-rays may be needed at this point in time  Return evaluation has been planned in 12 months or before THERAPY PLAN:  NCCN guidelines recommends the following surveillance for invasive breast cancer:  A. History and Physical exam every 4-6 months for 5 years and then every 12 months.  B. Mammography every 12 months  C. Women on Tamoxifen: annual gynecologic assessment every 12 months if uterus is present.  D. Women on aromatase inhibitor or who experience  ovarian failure secondary to treatment should have monitoring of bone health with a bone mineral density determination at baseline and periodically thereafter.  E. Assess and encourage adherence to adjuvant endocrine therapy.  F. Evidence suggests that active lifestyle and achieving and maintaining an ideal body weight (20-25 BMI) may lead to optimal breast cancer outcomes.   Port   has-been removed  Return clinic appointment in 12 months  All questions were answered. The patient knows to call the clinic with any problems, questions or concerns. We can certainly see the patient much sooner if necessary.   This note is electronically signed by: Forest Gleason, MD  10/17/2017 9:38 AM

## 2017-10-18 LAB — VITAMIN D 25 HYDROXY (VIT D DEFICIENCY, FRACTURES): Vit D, 25-Hydroxy: 31.8 ng/mL (ref 30.0–100.0)

## 2018-04-24 ENCOUNTER — Emergency Department (HOSPITAL_COMMUNITY)
Admission: EM | Admit: 2018-04-24 | Discharge: 2018-04-24 | Disposition: A | Discharge status: Home / Self Care | Payer: Medicare Other | Attending: Emergency Medicine | Admitting: Emergency Medicine

## 2018-04-24 ENCOUNTER — Emergency Department (HOSPITAL_COMMUNITY): Payer: Medicare Other

## 2018-04-24 ENCOUNTER — Encounter (HOSPITAL_COMMUNITY): Payer: Self-pay | Admitting: Emergency Medicine

## 2018-04-24 DIAGNOSIS — Z87891 Personal history of nicotine dependence: Secondary | ICD-10-CM | POA: Diagnosis not present

## 2018-04-24 DIAGNOSIS — Y929 Unspecified place or not applicable: Secondary | ICD-10-CM | POA: Diagnosis not present

## 2018-04-24 DIAGNOSIS — Y998 Other external cause status: Secondary | ICD-10-CM | POA: Diagnosis not present

## 2018-04-24 DIAGNOSIS — Z96642 Presence of left artificial hip joint: Secondary | ICD-10-CM | POA: Diagnosis not present

## 2018-04-24 DIAGNOSIS — W010XXA Fall on same level from slipping, tripping and stumbling without subsequent striking against object, initial encounter: Secondary | ICD-10-CM | POA: Diagnosis not present

## 2018-04-24 DIAGNOSIS — Z96641 Presence of right artificial hip joint: Secondary | ICD-10-CM | POA: Diagnosis not present

## 2018-04-24 DIAGNOSIS — Y9301 Activity, walking, marching and hiking: Secondary | ICD-10-CM | POA: Diagnosis not present

## 2018-04-24 DIAGNOSIS — Z853 Personal history of malignant neoplasm of breast: Secondary | ICD-10-CM | POA: Insufficient documentation

## 2018-04-24 DIAGNOSIS — M25552 Pain in left hip: Secondary | ICD-10-CM

## 2018-04-24 MED ORDER — OXYCODONE HCL 5 MG PO TABS: 5.0000 mg | ORAL_TABLET | Freq: Three times a day (TID) | ORAL | 0 refills | Status: AC | PRN

## 2018-04-24 NOTE — ED Triage Notes (Signed)
Pt reports falling yesterday and having left hip pain since the fall.  Pt took oxycodone 5mg  1 hr ago with no relief.

## 2018-04-24 NOTE — ED Provider Notes (Signed)
Emergency Department Provider Note   I have reviewed the triage vital signs and the nursing notes.   HISTORY  Chief Complaint Hip Pain   HPI Roberta Gonzalez is a 77 y.o. female with multiple medical problems as documented below the presents to the emergency department today secondary to leg pain after a fall.  Patient states that she tripped on a bag that was in the floor last night and fell landing on her left hip and knee and elbow.  She is only hurting in her left leg at this time.  She is able to ambulate however is very painful.  She took an oxycodone which seemed to help some but has since worn off.  Did not pass out or hit her head very hard.  No headache at this time.  No neurologic changes at this time.  She does have history of osteoporosis. No other associated or modifying symptoms.    Past Medical History:  Diagnosis Date  . Arthritis   . Breast cancer ([place])    right in 2000 in [place] and underwent a mastectomy.  . Cancer ([place])    jan 2012/mastectomy L; chemo/rad tx  . Chest pain 06/10/2011  . Depression   . GERD (gastroesophageal reflux disease) 06/10/2011  . Hypercholesteremia   . 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 Neuropathy   . Osteoporosis   . Port catheter in place 11/29/2012    Patient Active Problem List   Diagnosis Date Noted  . Chest pain 03/04/2017  . Hypercholesterolemia 03/04/2017  . Personal history of colonic polyps 03/13/2014  . Family hx of colon cancer 03/13/2014  . Port catheter in place 11/29/2012  . 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 GERD (gastroesophageal reflux disease) 06/10/2011  . Chest pain 06/10/2011    Past Surgical History:  Procedure Laterality Date  . ABDOMINAL HYSTERECTOMY    . CATARACT EXTRACTION W/PHACO Right 04/02/2013   Procedure: CATARACT EXTRACTION PHACO AND INTRAOCULAR LENS PLACEMENT (IOC);  Surgeon: [name], MD;  Location: [facility];  Service: Ophthalmology;  Laterality:  Right;  CDE 22.44  . CATARACT EXTRACTION W/PHACO Left 04/26/2013   Procedure: CATARACT EXTRACTION PHACO AND INTRAOCULAR LENS PLACEMENT (IOC);  Surgeon: [name], MD;  Location: [facility];  Service: Ophthalmology;  Laterality: Left;  CDE: 23.60  . COLONOSCOPY N/A 04/05/2014   Procedure: COLONOSCOPY;  Surgeon: [name], MD;  Location: [facility];  Service: Endoscopy;  Laterality: N/A;  11:15  . cranial shunt    . ESOPHAGOGASTRODUODENOSCOPY (EGD) WITH ESOPHAGEAL DILATION N/A 05/03/2013   Procedure: ESOPHAGOGASTRODUODENOSCOPY (EGD) WITH ESOPHAGEAL DILATION;  Surgeon: [name], MD;  Location: [facility];  Service: Endoscopy;  Laterality: N/A;  145-moved to 100 Ann to notify pt  . hip replacements Bilateral   . JOINT REPLACEMENT    . MASTECTOMY Bilateral   . PORT-A-CATH REMOVAL Right 03/22/2016   Procedure: MINOR REMOVAL PORT-A-CATH RIGHT SUBCLAVIAN;  Surgeon: Aviva Signs, MD;  Location: [facility];  Service: General;  Laterality: Right;  start at 0841; stop at 0858  . TONSILLECTOMY      Current Outpatient Rx  . Order #: [id number] Class: Historical Med  . Order #: [id number] Class: Historical Med  . Order #: [id number] Class: Normal  . Order #: [id number] Class: Historical Med  . Order #: [id number] Class: Historical Med  . Order #: [id number] Class: Historical Med  . Order #: [id number] Class: Historical Med  . Order #: [id number] Class: Historical Med  . Order #: [id number] Class: Historical  Med  . Order #: [id number] Class: Historical Med    Allergies Codeine; Percodan [oxycodone-aspirin]; and Sulfa antibiotics  Family History  Problem Relation Age of Onset  . Kidney disease Neg Hx   . Bladder Cancer Neg Hx     Social History Social History   Tobacco Use  . Smoking status: Former Research scientist (life sciences)  . Smokeless tobacco: Never Used  . Tobacco comment: quit smoking over 20 yrs ago  Substance Use Topics  . Alcohol use: Yes    Comment: glass of liquor about twice a week  . Drug use: No    Review of  Systems  All other systems negative except as documented in the HPI. All pertinent positives and negatives as reviewed in the HPI. ____________________________________________   PHYSICAL EXAM:  VITAL SIGNS: ED Triage Vitals  Enc Vitals Group     BP 04/24/18 1421 (!) 103/55     Pulse Rate 04/24/18 1421 84     Resp 04/24/18 1421 16     Temp 04/24/18 1421 98.3 F (36.8 C)     Temp Source 04/24/18 1421 Tympanic     SpO2 04/24/18 1421 97 %     Weight 04/24/18 1422 108 lb (49 kg)     Height 04/24/18 1422 5\' 3"  (1.6 m)     Head Circumference --      Peak Flow --      Pain Score 04/24/18 1421 10     Pain Loc --      Pain Edu? --      Excl. in [place]? --     Constitutional: Alert and oriented. Well appearing and in no acute distress. Eyes: Conjunctivae are normal. PERRL. EOMI. Head: Atraumatic. Nose: No congestion/rhinnorhea. Mouth/Throat: Mucous membranes are moist.  Oropharynx non-erythematous. Neck: No stridor.  No meningeal signs.   Cardiovascular: Normal rate, regular rhythm. Good peripheral circulation. Grossly normal heart sounds.   Respiratory: Normal respiratory effort.  No retractions. Lungs CTAB. Gastrointestinal: Soft and nontender. No distention.  Musculoskeletal: ecchymosis around her left knee. Pain with palpation to distal lateral femur and proximal tibia on left. Suspect small effusion on that side. Some ttp to l hip.  Neurologic:  Normal speech and language. No gross focal neurologic deficits are appreciated.  Skin:  Skin is warm, dry and intact. No rash noted.   ____________________________________________   [id number]  Dg Hip Unilat W Or Wo Pelvis 2-3 Views Left  Result Date: 04/24/2018 CLINICAL DATA:  Acute LEFT hip pain following fall yesterday. Initial encounter. Bilateral hip replacements. EXAM: DG HIP (WITH OR WITHOUT PELVIS) 2-3V LEFT COMPARISON:  03/04/2016 radiographs FINDINGS: There is no evidence of acute fracture, subluxation or dislocation. Bilateral  total hip arthroplasties identified without definite complicating features. No focal bony lesions are present. Retained catheter within the pelvis again noted. IMPRESSION: 1. No acute abnormality. 2. Bilateral total hip arthroplasties without definite complicating features. Electronically Signed   By: Margarette Canada M.D.   On: 04/24/2018 14:50   ____________________________________________   INITIAL IMPRESSION / ASSESSMENT AND PLAN / ED COURSE  Mechanical fall. XR hip negative. Will eval to ensure no fractures around the knee since her pain and bruising is predominately in that area.   No fracture. Ambulating. Suspect pain from contusions. Will dc with increased dose of pain meds and pcp follow up in a week to ensure improvement. RICE in mean time.      Pertinent labs & imaging results that were available during my care of the patient were reviewed by me  and considered in my medical decision making (see chart for details).  ____________________________________________  FINAL CLINICAL IMPRESSION(S) / ED DIAGNOSES  Final diagnoses:  None     MEDICATIONS GIVEN DURING THIS VISIT:  Medications - No data to display   NEW OUTPATIENT MEDICATIONS STARTED DURING THIS VISIT:  New Prescriptions   No medications on file    Note:  This note was prepared with assistance of Dragon voice recognition software. Occasional wrong-word or sound-a-like substitutions may have occurred due to the inherent limitations of voice recognition software.   Merrily Pew, MD 04/24/18 1944

## 2018-04-27 ENCOUNTER — Other Ambulatory Visit (HOSPITAL_COMMUNITY): Payer: Self-pay | Admitting: Adult Health

## 2018-08-16 ENCOUNTER — Ambulatory Visit: Payer: Medicare HMO | Admitting: Urology

## 2018-08-16 ENCOUNTER — Encounter: Payer: Self-pay | Admitting: Urology

## 2018-08-16 NOTE — Progress Notes (Deleted)
08/16/2018 6:00 AM   Roberta Gonzalez 12/23/1940 [id number]  Referring provider: [name], MD [address], [place] [address]  No chief complaint on file.   HPI: Patient is a 77 year old Caucasian female with a history of hematuria and a right renal stone who presents today for yearly visit.  CT renal stone study on 05/28/2016 revealed minimal left pelviectasis and dilation of the left ureter with mild indistinctness of the left ureteral wall, nonspecific findings which may be seen with inflammatory changes or partial distal left ureteral obstruction. The distal portion of the left ureter is obscured by beam hardening artifact caused by hip prosthesis hardware. The urinary bladder is obscured by the same artifact.  14 mm low-attenuation right adrenal mass, which likely represents adrenal adenoma.    Renal ultrasound completed on 06/29/2016 noted a 4 mm right lower pole renal calculus, 2 right renal cysts measuring up to 14 mm and no hydronephrosis.    KUB taken on 08/17/2017 No radiopaque calcifications identified overlying either kidney or along the expected course of either renal collecting system.    PMH: Past Medical History:  Diagnosis Date  . Arthritis   . Breast cancer ([place])    right in 2000 in [place] and underwent a mastectomy.  . Cancer ([place])    jan 2012/mastectomy L; chemo/rad tx  . Chest pain 06/10/2011  . Depression   . GERD (gastroesophageal reflux disease) 06/10/2011  . Hypercholesteremia   . 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 Neuropathy   . Osteoporosis   . Port catheter in place 11/29/2012    Surgical History: Past Surgical History:  Procedure Laterality Date  . ABDOMINAL HYSTERECTOMY    . CATARACT EXTRACTION W/PHACO Right 04/02/2013   Procedure: CATARACT EXTRACTION PHACO AND INTRAOCULAR LENS PLACEMENT (IOC);  Surgeon: [name], MD;  Location: [facility];  Service: Ophthalmology;  Laterality: Right;  CDE 22.44  .  CATARACT EXTRACTION W/PHACO Left 04/26/2013   Procedure: CATARACT EXTRACTION PHACO AND INTRAOCULAR LENS PLACEMENT (IOC);  Surgeon: [name], MD;  Location: [facility];  Service: Ophthalmology;  Laterality: Left;  CDE: 23.60  . COLONOSCOPY N/A 04/05/2014   Procedure: COLONOSCOPY;  Surgeon: [name], MD;  [facility];  Service: Endoscopy;  Laterality: N/A;  11:15  . cranial shunt    . ESOPHAGOGASTRODUODENOSCOPY (EGD) WITH ESOPHAGEAL DILATION N/A 05/03/2013   Procedure: ESOPHAGOGASTRODUODENOSCOPY (EGD) WITH ESOPHAGEAL DILATION;  Surgeon: [name], MD;  [facility];  Service: Endoscopy;  Laterality: N/A;  145-moved to 100 Ann to notify pt  . hip replacements Bilateral   . JOINT REPLACEMENT    . MASTECTOMY Bilateral   . PORT-A-CATH REMOVAL Right 03/22/2016   Procedure: MINOR REMOVAL PORT-A-CATH RIGHT SUBCLAVIAN;  Surgeon: Aviva Signs, MD;  Location: [facility];  Service: General;  Laterality: Right;  start at 0841; stop at 0858  . TONSILLECTOMY      Home Medications:  Allergies as of 08/16/2018      Reactions   Codeine Other (See Comments)   Makes pt nervous   Percodan [oxycodone-aspirin] Other (See Comments)   Mood change (climbing the walls, angry, irritable)   Sulfa Antibiotics Nausea Only      Medication List        Accurate as of 08/16/18  6:00 AM. Always use your most recent med list.          alendronate 70 MG tablet Commonly known as:  FOSAMAX Take 70 mg by mouth  every Tuesday. Take with a full glass of water on an empty stomach.(sunday)   diphenhydrAMINE 25 mg capsule Commonly known as:  BENADRYL Take 25 mg by mouth every 4 (four) hours as needed for allergies.   escitalopram 20 MG tablet Commonly known as:  LEXAPRO TAKE ONE TABLET BY MOUTH ONCE DAILY   fexofenadine 180 MG tablet Commonly known as:  ALLEGRA Take 180 mg by mouth every morning.   fish oil-omega-3 fatty acids 1000 MG capsule Take 1 g by mouth 2 (two) times daily.     oxyCODONE 5 MG immediate release tablet Commonly known as:  Oxy IR/ROXICODONE Take 1-2 tablets (5-10 mg total) by mouth 3 (three) times daily as needed for severe pain.   pantoprazole 40 MG tablet Commonly known as:  PROTONIX Take 40 mg by mouth daily.   pravastatin 40 MG tablet Commonly known as:  PRAVACHOL Take 40 mg by mouth daily.   prenatal multivitamin 60-1 MG tablet Take 1 tablet by mouth daily with breakfast.       Allergies:  Allergies  Allergen Reactions  . Codeine Other (See Comments)    Makes pt nervous  . Percodan [Oxycodone-Aspirin] Other (See Comments)    Mood change (climbing the walls, angry, irritable)  . Sulfa Antibiotics Nausea Only    Family History: Family History  Problem Relation Age of Onset  . Kidney disease Neg Hx   . Bladder Cancer Neg Hx     Social History:  reports that she has quit smoking. She has never used smokeless tobacco. She reports that she drinks alcohol. She reports that she does not use drugs.  ROS:                                        Physical Exam: There were no vitals taken for this visit.  Constitutional: Well nourished. Alert and oriented, No acute distress. HEENT: [place] AT, moist mucus membranes. Trachea midline, no masses. Cardiovascular: No clubbing, cyanosis, or edema. Respiratory: Normal respiratory effort, no increased work of breathing. GI: Abdomen is soft, non tender, non distended, no abdominal masses. Liver and spleen not palpable.  No hernias appreciated.  Stool sample for occult testing is not indicated.   GU: No CVA tenderness.  No bladder fullness or masses.  Normal external genitalia, normal pubic hair distribution, no lesions.  Normal urethral meatus, no lesions, no prolapse, no discharge.   No urethral masses, tenderness and/or tenderness. No bladder fullness, tenderness or masses. Normal vagina mucosa, good estrogen effect, no discharge, no lesions, good pelvic support, no  cystocele or rectocele noted.  No cervical motion tenderness.  Uterus is freely mobile and non-fixed.  No adnexal/parametria masses or tenderness noted.  Anus and perineum are without rashes or lesions.   *** Skin: No rashes, bruises or suspicious lesions. Lymph: No cervical or inguinal adenopathy. Neurologic: Grossly intact, no focal deficits, moving all 4 extremities. Psychiatric: Normal mood and affect.  Laboratory Data: Lab Results  Component Value Date   WBC 5.5 10/17/2017   HGB 13.3 10/17/2017   HCT 42.5 10/17/2017   MCV 101.0 (H) 10/17/2017   PLT 242 10/17/2017    Lab Results  Component Value Date   CREATININE 0.56 10/17/2017     Lab Results  Component Value Date   AST 17 10/17/2017   Lab Results  Component Value Date   ALT 13 (L) 10/17/2017   I have  reviewed the labs  Pertinent Imaging: *** I have independently reviewed the films  Assessment & Plan:    1. History of hematuria  - patient does not report any gross hematuria.    2. Right renal stone  - KUB today demonstrates no stones  - RTC in one year for KUB and symptom recheck    No follow-ups on file.  These notes generated with voice recognition software. I apologize for typographical errors.  Zara Council, PA-C  [facility] [address] [place], [place] [address] [phone]

## 2018-10-04 ENCOUNTER — Other Ambulatory Visit (HOSPITAL_COMMUNITY): Payer: Self-pay | Admitting: *Deleted

## 2018-10-04 DIAGNOSIS — C50919 Malignant neoplasm of unspecified site of unspecified female breast: Secondary | ICD-10-CM

## 2018-10-05 ENCOUNTER — Other Ambulatory Visit (HOSPITAL_COMMUNITY): Payer: Medicare Other

## 2018-10-05 ENCOUNTER — Telehealth (HOSPITAL_COMMUNITY): Payer: Self-pay | Admitting: Surgery

## 2018-10-09 ENCOUNTER — Inpatient Hospital Stay (HOSPITAL_COMMUNITY): Payer: Medicare HMO | Attending: Hematology

## 2018-10-09 DIAGNOSIS — Z87891 Personal history of nicotine dependence: Secondary | ICD-10-CM | POA: Insufficient documentation

## 2018-10-09 DIAGNOSIS — G629 Polyneuropathy, unspecified: Secondary | ICD-10-CM | POA: Insufficient documentation

## 2018-10-09 DIAGNOSIS — C50919 Malignant neoplasm of unspecified site of unspecified female breast: Secondary | ICD-10-CM

## 2018-10-09 DIAGNOSIS — Z79899 Other long term (current) drug therapy: Secondary | ICD-10-CM | POA: Insufficient documentation

## 2018-10-09 DIAGNOSIS — Z9013 Acquired absence of bilateral breasts and nipples: Secondary | ICD-10-CM | POA: Diagnosis not present

## 2018-10-09 DIAGNOSIS — C50912 Malignant neoplasm of unspecified site of left female breast: Secondary | ICD-10-CM | POA: Diagnosis not present

## 2018-10-09 DIAGNOSIS — Z9221 Personal history of antineoplastic chemotherapy: Secondary | ICD-10-CM | POA: Diagnosis not present

## 2018-10-09 DIAGNOSIS — K5900 Constipation, unspecified: Secondary | ICD-10-CM | POA: Diagnosis not present

## 2018-10-09 DIAGNOSIS — M810 Age-related osteoporosis without current pathological fracture: Secondary | ICD-10-CM | POA: Diagnosis not present

## 2018-10-09 DIAGNOSIS — Z171 Estrogen receptor negative status [ER-]: Secondary | ICD-10-CM | POA: Diagnosis not present

## 2018-10-09 LAB — CBC WITH DIFFERENTIAL/PLATELET
ABS IMMATURE GRANULOCYTES: 0.02 10*3/uL (ref 0.00–0.07)
BASOS PCT: 1 %
Basophils Absolute: 0.1 10*3/uL (ref 0.0–0.1)
Eosinophils Absolute: 0.2 10*3/uL (ref 0.0–0.5)
Eosinophils Relative: 3 %
HCT: 37.8 % (ref 36.0–46.0)
HEMOGLOBIN: 11.8 g/dL — AB (ref 12.0–15.0)
IMMATURE GRANULOCYTES: 0 %
LYMPHS PCT: 23 %
Lymphs Abs: 1.2 10*3/uL (ref 0.7–4.0)
MCH: 31 pg (ref 26.0–34.0)
MCHC: 31.2 g/dL (ref 30.0–36.0)
MCV: 99.2 fL (ref 80.0–100.0)
Monocytes Absolute: 0.6 10*3/uL (ref 0.1–1.0)
Monocytes Relative: 11 %
NEUTROS ABS: 3.4 10*3/uL (ref 1.7–7.7)
NEUTROS PCT: 62 %
PLATELETS: 271 10*3/uL (ref 150–400)
RBC: 3.81 MIL/uL — ABNORMAL LOW (ref 3.87–5.11)
RDW: 12.3 % (ref 11.5–15.5)
WBC: 5.4 10*3/uL (ref 4.0–10.5)
nRBC: 0 % (ref 0.0–0.2)

## 2018-10-09 LAB — COMPREHENSIVE METABOLIC PANEL
ALK PHOS: 41 U/L (ref 38–126)
ALT: 15 U/L (ref 0–44)
AST: 15 U/L (ref 15–41)
Albumin: 3.7 g/dL (ref 3.5–5.0)
Anion gap: 6 (ref 5–15)
BUN: 14 mg/dL (ref 8–23)
CALCIUM: 8.8 mg/dL — AB (ref 8.9–10.3)
CHLORIDE: 105 mmol/L (ref 98–111)
CO2: 27 mmol/L (ref 22–32)
Creatinine, Ser: 0.48 mg/dL (ref 0.44–1.00)
GFR calc Af Amer: >60 mL/min (ref >60)
GFR calc non Af Amer: >60 mL/min (ref >60)
Glucose, Bld: 103 mg/dL — ABNORMAL HIGH (ref 70–99)
Potassium: 3.9 mmol/L (ref 3.5–5.1)
SODIUM: 138 mmol/L (ref 135–145)
TOTAL PROTEIN: 6.9 g/dL (ref 6.5–8.1)
Total Bilirubin: 0.6 mg/dL (ref 0.3–1.2)

## 2018-10-09 LAB — LACTATE DEHYDROGENASE: LDH: 121 U/L (ref 98–192)

## 2018-10-10 ENCOUNTER — Telehealth: Payer: Self-pay | Admitting: Urology

## 2018-10-10 NOTE — Telephone Encounter (Signed)
Please call Roberta Gonzalez and reschedule her missed appointment from September.  She needs a KUB prior.

## 2018-10-12 ENCOUNTER — Other Ambulatory Visit: Payer: Self-pay

## 2018-10-12 ENCOUNTER — Inpatient Hospital Stay (HOSPITAL_BASED_OUTPATIENT_CLINIC_OR_DEPARTMENT_OTHER): Payer: Medicare HMO | Admitting: Hematology

## 2018-10-12 ENCOUNTER — Encounter (HOSPITAL_COMMUNITY): Payer: Self-pay | Admitting: Hematology

## 2018-10-12 VITALS — BP 99/52 | HR 84 | Temp 98.5°F | Resp 18 | Wt 104.1 lb

## 2018-10-12 DIAGNOSIS — C50912 Malignant neoplasm of unspecified site of left female breast: Secondary | ICD-10-CM

## 2018-10-12 DIAGNOSIS — Z9013 Acquired absence of bilateral breasts and nipples: Secondary | ICD-10-CM | POA: Diagnosis not present

## 2018-10-12 DIAGNOSIS — Z87891 Personal history of nicotine dependence: Secondary | ICD-10-CM

## 2018-10-12 DIAGNOSIS — Z171 Estrogen receptor negative status [ER-]: Secondary | ICD-10-CM | POA: Diagnosis not present

## 2018-10-12 DIAGNOSIS — M810 Age-related osteoporosis without current pathological fracture: Secondary | ICD-10-CM

## 2018-10-12 DIAGNOSIS — Z79899 Other long term (current) drug therapy: Secondary | ICD-10-CM

## 2018-10-12 DIAGNOSIS — Z9221 Personal history of antineoplastic chemotherapy: Secondary | ICD-10-CM | POA: Diagnosis not present

## 2018-10-12 DIAGNOSIS — G629 Polyneuropathy, unspecified: Secondary | ICD-10-CM

## 2018-10-12 DIAGNOSIS — C50919 Malignant neoplasm of unspecified site of unspecified female breast: Secondary | ICD-10-CM

## 2018-10-12 DIAGNOSIS — K5900 Constipation, unspecified: Secondary | ICD-10-CM

## 2018-10-12 NOTE — Patient Instructions (Signed)
[place] at [facility] Discharge Instructions  Follow up in 1 year    Thank you for choosing [place] at [facility] to provide your oncology and hematology care.  To afford each patient quality time with our provider, please arrive at least 15 minutes before your scheduled appointment time.   If you have a lab appointment with the [facility] please come in thru the  Main Entrance and check in at the main information desk  You need to re-schedule your appointment should you arrive 10 or more minutes late.  We strive to give you quality time with our providers, and arriving late affects you and other patients whose appointments are after yours.  Also, if you no show three or more times for appointments you may be dismissed from the clinic at the providers discretion.     Again, thank you for choosing [facility].  Our hope is that these requests will decrease the amount of time that you wait before being seen by our physicians.       _____________________________________________________________  Should you have questions after your visit to [facility], please contact our office at [phone] between the hours of 8:00 a.m. and 4:30 p.m.  Voicemails left after 4:00 p.m. will not be returned until the following business day.  For prescription refill requests, have your pharmacy contact our office and allow 72 hours.    Cancer Center Support Programs:   > Cancer Support Group  2nd Tuesday of the month 1pm-2pm, Journey Room

## 2018-10-12 NOTE — Progress Notes (Signed)
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    11/27/2010 Surgery    Left simple mastectomy: Multicentric invasive ductal cancer, 2.1 and 0.4 cm, +LVI, 2/8 positive lymph nodes.  ER-, PR-, HER2 3+, Ki-67 82%.    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 Mankin 77 y.o. female returns for routine follow-up for breast cancer. Patient is here with her daughter. She is doing well. She has numbness and tingling in her hands and feet but this is stable and has been going on for years. She has intermittent constipation which resolves with OTC medication. She denies any nausea, vomiting, or diarrhea. Denies any fevers or recent infections. Denies any bleeding. She reports her appetite and energy level at 50%.     REVIEW OF SYSTEMS:  Review of Systems  Gastrointestinal: Positive for constipation.  Neurological: Positive for numbness.  All other systems reviewed and are negative.    PAST MEDICAL/SURGICAL HISTORY:  Past Medical History:  Diagnosis Date  . Arthritis   . Breast cancer ([place])    right in 2000 in [place] and  underwent a mastectomy.  . Cancer ([place])    jan 2012/mastectomy L; chemo/rad tx  . Chest pain 06/10/2011  . Depression   . GERD (gastroesophageal reflux disease) 06/10/2011  . Hypercholesteremia   . 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 Neuropathy   . Osteoporosis   . Port catheter in place 11/29/2012   Past Surgical History:  Procedure Laterality Date  . ABDOMINAL HYSTERECTOMY    . CATARACT EXTRACTION W/PHACO Right 04/02/2013   Procedure: CATARACT EXTRACTION PHACO AND INTRAOCULAR LENS PLACEMENT (IOC);  Surgeon: [name], MD;  Location: [facility];  Service: Ophthalmology;  Laterality: Right;  CDE 22.44  . CATARACT EXTRACTION W/PHACO Left 04/26/2013   Procedure: CATARACT EXTRACTION PHACO AND INTRAOCULAR LENS PLACEMENT (IOC);  Surgeon: [name], MD;  Location: [facility];  Service: Ophthalmology;  Laterality: Left;  CDE: 23.60  . COLONOSCOPY N/A 04/05/2014   Procedure: COLONOSCOPY;  Surgeon: [name], MD;  Location: [facility];  Service: Endoscopy;  Laterality: N/A;  11:15  . cranial shunt    . ESOPHAGOGASTRODUODENOSCOPY (EGD) WITH ESOPHAGEAL DILATION N/A 05/03/2013   Procedure: ESOPHAGOGASTRODUODENOSCOPY (EGD) WITH ESOPHAGEAL DILATION;  Surgeon: [name], MD;  Location: [facility];  Service: Endoscopy;  Laterality: N/A;  145-moved to 100 Ann to notify pt  . hip replacements Bilateral   . JOINT REPLACEMENT    . MASTECTOMY Bilateral   . PORT-A-CATH REMOVAL Right 03/22/2016   Procedure: MINOR REMOVAL PORT-A-CATH RIGHT SUBCLAVIAN;  Surgeon: Aviva Signs,  MD;  Location: [facility];  Service: General;  Laterality: Right;  start at 0841; stop at 0858  . TONSILLECTOMY       SOCIAL HISTORY:  Social History   Socioeconomic History  . Marital status: Widowed    Spouse name: Not on file  . Number of children: Not on file  . Years of education: Not on file  . Highest education level: Not on file  Occupational History  . Not on file  Social Needs  . Financial resource  strain: Not on file  . Food insecurity:    Worry: Not on file    Inability: Not on file  . Transportation needs:    Medical: Not on file    Non-medical: Not on file  Tobacco Use  . Smoking status: Former Research scientist (life sciences)  . Smokeless tobacco: Never Used  . Tobacco comment: quit smoking over 20 yrs ago  Substance and Sexual Activity  . Alcohol use: Yes    Comment: glass of liquor about twice a week  . Drug use: No  . Sexual activity: Yes    Birth control/protection: Surgical  Lifestyle  . Physical activity:    Days per week: Not on file    Minutes per session: Not on file  . Stress: Not on file  Relationships  . Social connections:    Talks on phone: Not on file    Gets together: Not on file    Attends religious service: Not on file    Active member of club or organization: Not on file    Attends meetings of clubs or organizations: Not on file    Relationship status: Not on file  . Intimate partner violence:    Fear of current or ex partner: Not on file    Emotionally abused: Not on file    Physically abused: Not on file    Forced sexual activity: Not on file  Other Topics Concern  . Not on file  Social History Narrative  . Not on file    FAMILY HISTORY:  Family History  Problem Relation Age of Onset  . Kidney disease Neg Hx   . Bladder Cancer Neg Hx     CURRENT MEDICATIONS:  Outpatient Encounter Medications as of 10/12/2018  Medication Sig  . alendronate (FOSAMAX) 70 MG tablet Take 70 mg by mouth every Tuesday. Take with a full glass of water on an empty stomach.(sunday)  . diphenhydrAMINE (BENADRYL) 25 mg capsule Take 25 mg by mouth every 4 (four) hours as needed for allergies.  Marland Kitchen escitalopram (LEXAPRO) 20 MG tablet TAKE ONE TABLET BY MOUTH ONCE DAILY  . fexofenadine (ALLEGRA) 180 MG tablet Take 180 mg by mouth every morning.   . fish oil-omega-3 fatty acids 1000 MG capsule Take 1 g by mouth 2 (two) times daily.   Marland Kitchen oxyCODONE (OXY IR/ROXICODONE) 5 MG immediate release  tablet Take 1-2 tablets (5-10 mg total) by mouth 3 (three) times daily as needed for severe pain.  . pantoprazole (PROTONIX) 40 MG tablet Take 40 mg by mouth daily.   . pravastatin (PRAVACHOL) 40 MG tablet Take 40 mg by mouth daily.   . Prenatal Vit-Fe Psac Cmplx-FA (PRENATAL MULTIVITAMIN) 60-1 MG tablet Take 1 tablet by mouth daily with breakfast.    No facility-administered encounter medications on file as of 10/12/2018.     ALLERGIES:  Allergies  Allergen Reactions  . Codeine Other (See Comments)    Makes pt nervous  . Percodan [Oxycodone-Aspirin] Other (See Comments)  Mood change (climbing the walls, angry, irritable)  . Sulfa Antibiotics Nausea Only     PHYSICAL EXAM:  ECOG Performance status: 1  Vitals:   10/12/18 1156  BP: (!) 99/52  Pulse: 84  Resp: 18  Temp: 98.5 F (36.9 C)  SpO2: 98%   Filed Weights   10/12/18 1156  Weight: 104 lb 1.6 oz (47.2 kg)    Physical Exam  Constitutional: She is oriented to person, place, and time. She appears well-developed and well-nourished.  Musculoskeletal: Normal range of motion.  Neurological: She is alert and oriented to person, place, and time.  Skin: Skin is warm and dry.  Psychiatric: She has a normal mood and affect. Her behavior is normal. Judgment and thought content normal.  Breast: No palpable masses, no skin changes or nipple discharge, no adenopathy.   LABORATORY DATA:  I have reviewed the labs as listed.  CBC    Component Value Date/Time   WBC 5.4 10/09/2018 0932   RBC 3.81 (L) 10/09/2018 0932   HGB 11.8 (L) 10/09/2018 0932   HCT 37.8 10/09/2018 0932   PLT 271 10/09/2018 0932   MCV 99.2 10/09/2018 0932   MCH 31.0 10/09/2018 0932   MCHC 31.2 10/09/2018 0932   RDW 12.3 10/09/2018 0932   LYMPHSABS 1.2 10/09/2018 0932   MONOABS 0.6 10/09/2018 0932   EOSABS 0.2 10/09/2018 0932   BASOSABS 0.1 10/09/2018 0932   CMP Latest Ref Rng & Units 10/09/2018 10/17/2017 10/17/2017  Glucose 70 - 99 mg/dL 103(H)  111(H) 111(H)  BUN 8 - 23 mg/dL _0 Creatinine 0.44 - 1.00 mg/dL 0.48 0.56 0.57  Sodium 135 - 145 mmol/L 138 141 140  Potassium 3.5 - 5.1 mmol/L 3.9 4.1 4.1  Chloride 98 - 111 mmol/L 105 107 107  CO2 22 - 32 mmol/L _1 Calcium 8.9 - 10.3 mg/dL 8.8(L) 9.2 9.2  Total Protein 6.5 - 8.1 g/dL 6.9 7.0 7.1  Total Bilirubin 0.3 - 1.2 mg/dL 0.6 0.5 0.3  Alkaline Phos 38 - 126 U/L 41 46 46  AST 15 - 41 U/L _2 ALT 0 - 44 U/L 15 13(L) 15         ASSESSMENT & PLAN:   Invasive ductal carcinoma of breast 1.  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  Today's examination did not reveal any suspicious nodules at the mastectomy site. - We have reviewed her labs which are grossly unchanged. -We will see her back in 1 year with repeat blood work.  2.  Osteoporosis: -Last DEXA scan in 2016 shows osteoporosis with T score of -4.1.  She is taking Fosamax every week. -She is also continuing calcium and vitamin D supplements. - We will plan to repeat DEXA scan prior to next visit in 1 year.      Orders placed this encounter:  Orders Placed This Encounter  Procedures  . DG Bone Density  . CBC with Differential/Platelet  . Comprehensive metabolic panel  . Lactate dehydrogenase      Derek Jack, MD [place] [phone]

## 2018-10-12 NOTE — Assessment & Plan Note (Signed)
1.  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  Today's examination did not reveal any suspicious nodules at the mastectomy site. - We have reviewed her labs which are grossly unchanged. -We will see her back in 1 year with repeat blood work.  2.  Osteoporosis: -Last DEXA scan in 2016 shows osteoporosis with T score of -4.1.  She is taking Fosamax every week. -She is also continuing calcium and vitamin D supplements. - We will plan to repeat DEXA scan prior to next visit in 1 year.

## 2019-01-02 ENCOUNTER — Other Ambulatory Visit: Payer: Self-pay

## 2019-01-02 ENCOUNTER — Emergency Department (HOSPITAL_COMMUNITY)
Admission: EM | Admit: 2019-01-02 | Discharge: 2019-01-02 | Disposition: A | Discharge status: Home / Self Care | Payer: Medicare HMO | Attending: Emergency Medicine | Admitting: Emergency Medicine

## 2019-01-02 ENCOUNTER — Emergency Department (HOSPITAL_COMMUNITY): Payer: Medicare HMO

## 2019-01-02 ENCOUNTER — Encounter (HOSPITAL_COMMUNITY): Payer: Self-pay | Admitting: *Deleted

## 2019-01-02 DIAGNOSIS — Y998 Other external cause status: Secondary | ICD-10-CM | POA: Diagnosis not present

## 2019-01-02 DIAGNOSIS — Y9301 Activity, walking, marching and hiking: Secondary | ICD-10-CM | POA: Diagnosis not present

## 2019-01-02 DIAGNOSIS — W010XXA Fall on same level from slipping, tripping and stumbling without subsequent striking against object, initial encounter: Secondary | ICD-10-CM | POA: Insufficient documentation

## 2019-01-02 DIAGNOSIS — S8001XA Contusion of right knee, initial encounter: Secondary | ICD-10-CM | POA: Insufficient documentation

## 2019-01-02 DIAGNOSIS — S8002XA Contusion of left knee, initial encounter: Secondary | ICD-10-CM | POA: Diagnosis not present

## 2019-01-02 DIAGNOSIS — Y929 Unspecified place or not applicable: Secondary | ICD-10-CM | POA: Insufficient documentation

## 2019-01-02 DIAGNOSIS — S8991XA Unspecified injury of right lower leg, initial encounter: Secondary | ICD-10-CM | POA: Diagnosis present

## 2019-01-02 DIAGNOSIS — F329 Major depressive disorder, single episode, unspecified: Secondary | ICD-10-CM | POA: Diagnosis not present

## 2019-01-02 DIAGNOSIS — S8000XA Contusion of unspecified knee, initial encounter: Secondary | ICD-10-CM

## 2019-01-02 DIAGNOSIS — Z853 Personal history of malignant neoplasm of breast: Secondary | ICD-10-CM | POA: Diagnosis not present

## 2019-01-02 DIAGNOSIS — Z79899 Other long term (current) drug therapy: Secondary | ICD-10-CM | POA: Diagnosis not present

## 2019-01-02 DIAGNOSIS — Z87891 Personal history of nicotine dependence: Secondary | ICD-10-CM | POA: Diagnosis not present

## 2019-01-02 NOTE — ED Notes (Signed)
Given ice pack for knee

## 2019-01-02 NOTE — Discharge Instructions (Signed)
Ice the area to help with the discomfort, tylenol as needed for pain

## 2019-01-02 NOTE — ED Provider Notes (Signed)
[facility] EMERGENCY DEPARTMENT Provider Note   CSN: [id number] Arrival date & time: 01/02/19  1232     History   Chief Complaint Chief Complaint  Patient presents with  . Fall    HPI Roberta Gonzalez is a 78 y.o. female.  HPI Patient presented to the emergency room for evaluation of knee pain.  Patient was with a family member who is here in the hospital.  Patient was walking when her foot stuck on the ground and she fell forward landing on both of her knees.  Patient denied any other injury.  She did not hit her head.  She is not have any hip pain or upper extremity pain.  She does have pain in both of her knees.  She was assisted up and instructed to get checked out.  She is able to bear weight. Past Medical History:  Diagnosis Date  . Arthritis   . Breast cancer ([place])    right in 2000 in [place] and underwent a mastectomy.  . Cancer ([place])    jan 2012/mastectomy L; chemo/rad tx  . Chest pain 06/10/2011  . Depression   . GERD (gastroesophageal reflux disease) 06/10/2011  . Hypercholesteremia   . 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 Neuropathy   . Osteoporosis   . Port catheter in place 11/29/2012    Patient Active Problem List   Diagnosis Date Noted  . Chest pain 03/04/2017  . Hypercholesterolemia 03/04/2017  . Personal history of colonic polyps 03/13/2014  . Family hx of colon cancer 03/13/2014  . Port catheter in place 11/29/2012  . 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 GERD (gastroesophageal reflux disease) 06/10/2011  . Chest pain 06/10/2011    Past Surgical History:  Procedure Laterality Date  . ABDOMINAL HYSTERECTOMY    . CATARACT EXTRACTION W/PHACO Right 04/02/2013   Procedure: CATARACT EXTRACTION PHACO AND INTRAOCULAR LENS PLACEMENT (IOC);  Surgeon: [name], MD;  Location: [facility];  Service: Ophthalmology;  Laterality: Right;  CDE 22.44  . CATARACT EXTRACTION W/PHACO Left 04/26/2013   Procedure: CATARACT EXTRACTION  PHACO AND INTRAOCULAR LENS PLACEMENT (IOC);  Surgeon: [name], MD;  Location: [facility];  Service: Ophthalmology;  Laterality: Left;  CDE: 23.60  . COLONOSCOPY N/A 04/05/2014   Procedure: COLONOSCOPY;  Surgeon: [name], MD;  Location: [facility];  Service: Endoscopy;  Laterality: N/A;  11:15  . cranial shunt    . ESOPHAGOGASTRODUODENOSCOPY (EGD) WITH ESOPHAGEAL DILATION N/A 05/03/2013   Procedure: ESOPHAGOGASTRODUODENOSCOPY (EGD) WITH ESOPHAGEAL DILATION;  Surgeon: [name], MD;  Location: [facility];  Service: Endoscopy;  Laterality: N/A;  145-moved to 100 Ann to notify pt  . hip replacements Bilateral   . JOINT REPLACEMENT    . MASTECTOMY Bilateral   . PORT-A-CATH REMOVAL Right 03/22/2016   Procedure: MINOR REMOVAL PORT-A-CATH RIGHT SUBCLAVIAN;  Surgeon: Aviva Signs, MD;  Location: [facility];  Service: General;  Laterality: Right;  start at 0841; stop at 0858  . TONSILLECTOMY       OB History   No obstetric history on file.      Home Medications    Prior to Admission medications   Medication Sig Start Date End Date Taking? Authorizing Provider  alendronate (FOSAMAX) 70 MG tablet Take 70 mg by mouth every Tuesday. Take with a full glass of water on an empty stomach.(sunday)    [provider]  diphenhydrAMINE (BENADRYL) 25 mg capsule Take 25 mg by mouth every 4 (four)  hours as needed for allergies.    [provider]  escitalopram (LEXAPRO) 20 MG tablet TAKE ONE TABLET BY MOUTH ONCE DAILY 06/30/17   Holley Bouche, NP  fexofenadine (ALLEGRA) 180 MG tablet Take 180 mg by mouth every morning.     [provider]  fish oil-omega-3 fatty acids 1000 MG capsule Take 1 g by mouth 2 (two) times daily.     [provider]  oxyCODONE (OXY IR/ROXICODONE) 5 MG immediate release tablet Take 1-2 tablets (5-10 mg total) by mouth 3 (three) times daily as needed for severe pain. 04/24/18   Mesner, Corene Cornea, MD  pantoprazole (PROTONIX) 40 MG tablet Take 40  mg by mouth daily.  03/12/18   [provider]  pravastatin (PRAVACHOL) 40 MG tablet Take 40 mg by mouth daily.     [provider]  Prenatal Vit-Fe Psac Cmplx-FA (PRENATAL MULTIVITAMIN) 60-1 MG tablet Take 1 tablet by mouth daily with breakfast.     [provider]    Family History Family History  Problem Relation Age of Onset  . Kidney disease Neg Hx   . Bladder Cancer Neg Hx     Social History Social History   Tobacco Use  . Smoking status: Former Research scientist (life sciences)  . Smokeless tobacco: Never Used  . Tobacco comment: quit smoking over 20 yrs ago  Substance Use Topics  . Alcohol use: Yes    Comment: glass of liquor about twice a week  . Drug use: No     Allergies   Codeine; Percodan [oxycodone-aspirin]; and Sulfa antibiotics   Review of Systems Review of Systems  All other systems reviewed and are negative.    Physical Exam Updated Vital Signs BP (!) 108/47 (BP Location: Right Arm)   Pulse 65   Temp 97.8 F (36.6 C) (Oral)   Resp 15   Ht 1.6 m (5\' 3" )   Wt 46.7 kg   SpO2 96%   BMI 18.25 kg/m   Physical Exam Vitals signs and nursing note reviewed.  Constitutional:      General: She is not in acute distress.    Appearance: She is well-developed.  HENT:     Head: Normocephalic and atraumatic.     Right Ear: External ear normal.     Left Ear: External ear normal.  Eyes:     General: No scleral icterus.       Right eye: No discharge.        Left eye: No discharge.     Conjunctiva/sclera: Conjunctivae normal.  Neck:     Musculoskeletal: Neck supple.     Trachea: No tracheal deviation.  Cardiovascular:     Rate and Rhythm: Normal rate.  Pulmonary:     Effort: Pulmonary effort is normal. No respiratory distress.     Breath sounds: No stridor.  Abdominal:     General: There is no distension.  Musculoskeletal:        General: No swelling or deformity.     Right shoulder: She exhibits no tenderness, no bony tenderness and no swelling.       Left shoulder: She exhibits no tenderness, no bony tenderness and no swelling.     Right wrist: She exhibits no tenderness, no bony tenderness and no swelling.     Left wrist: She exhibits no tenderness, no bony tenderness and no swelling.     Right hip: She exhibits normal range of motion, no tenderness, no bony tenderness and no swelling.     Left  hip: She exhibits normal range of motion, no tenderness and no bony tenderness.     Right knee: She exhibits normal range of motion and no swelling. Tenderness found.     Left knee: She exhibits normal range of motion and no swelling. Tenderness found.     Right ankle: She exhibits no swelling. No tenderness.     Left ankle: She exhibits no swelling. No tenderness.     Cervical back: She exhibits no tenderness, no bony tenderness and no swelling.     Thoracic back: She exhibits no tenderness, no bony tenderness and no swelling.     Lumbar back: She exhibits no tenderness, no bony tenderness and no swelling.     Comments: Mild erythema noted bilateral region, no significant swelling, mild tenderness palpation  Skin:    General: Skin is warm and dry.     Findings: No rash.  Neurological:     Mental Status: She is alert.     Cranial Nerves: Cranial nerve deficit: no gross deficits.      ED Treatments / Results  Labs (all labs ordered are listed, but only abnormal results are displayed) Labs Reviewed - No data to display  EKG None  Radiology Dg Knee Complete 4 Views Left  Result Date: 01/02/2019 CLINICAL DATA:  [place] [address] on front lobby of hospital today, pain in BILATERAL knees, increased pain on LEFT EXAM: LEFT KNEE - COMPLETE 4+ VIEW COMPARISON:  LEFT tibial and fibular radiographs of 04/24/2018 FINDINGS: Osseous demineralization. Lateral downsloping at the lateral tibial plateau appears unchanged. Joint spaces preserved. No acute fracture, dislocation, or bone destruction. Suspect proximal LEFT fibular diaphyseal fracture. No knee joint  effusion. IMPRESSION: Osseous demineralization. No acute abnormalities. Electronically Signed   By: Lavonia Dana M.D.   On: 01/02/2019 13:37   Dg Knee Complete 4 Views Right  Result Date: 01/02/2019 CLINICAL DATA:  [place] [address] on front lobby of hospital today, pain in BILATERAL knees, increased pain on LEFT EXAM: RIGHT KNEE - COMPLETE 4+ VIEW COMPARISON:  None FINDINGS: Osseous demineralization. Joint spaces preserved. No acute fracture, dislocation, or bone destruction. No knee joint effusion. IMPRESSION: No acute osseous abnormalities. Electronically Signed   By: Lavonia Dana M.D.   On: 01/02/2019 13:38    Procedures Procedures (including critical care time)  Medications Ordered in ED Medications - No data to display   Initial Impression / Assessment and Plan / ED Course  I have reviewed the triage vital signs and the nursing notes.  Pertinent labs & imaging results that were available during my care of the patient were reviewed by me and considered in my medical decision making (see chart for details).   X-rays do not show evidence of any acute fracture.  Tendons are related to contusion.  Discussed use of ice, and over-the-counter medications as needed.  Final Clinical Impressions(s) / ED Diagnoses   Final diagnoses:  Contusion of knee, unspecified laterality, initial encounter    ED Discharge Orders    None       Dorie Rank, MD 01/02/19 1357

## 2019-01-02 NOTE — ED Triage Notes (Signed)
Patient visiting in short stay and had a fall to the floor, falling onto both knees.  Patient assisted back up by staff in short stay.

## 2019-10-12 ENCOUNTER — Inpatient Hospital Stay (HOSPITAL_COMMUNITY): Payer: Medicare Other | Attending: Hematology

## 2019-10-12 ENCOUNTER — Other Ambulatory Visit (HOSPITAL_COMMUNITY): Payer: Medicare HMO

## 2019-10-15 ENCOUNTER — Inpatient Hospital Stay (HOSPITAL_COMMUNITY): Payer: Medicare Other | Admitting: Hematology

## 2021-06-02 NOTE — Telephone Encounter (Signed)
error 

## 2021-12-23 DEATH — deceased
